# Patient Record
Sex: Female | Born: 1955 | Race: Black or African American | Hispanic: No | Marital: Married | Smoking: Former smoker
Health system: Southern US, Community
[De-identification: ages and names within clinical notes are randomized; demographics above are authoritative.]

## PROBLEM LIST (undated history)

## (undated) DIAGNOSIS — E876 Hypokalemia: Secondary | ICD-10-CM

## (undated) DIAGNOSIS — I219 Acute myocardial infarction, unspecified: Secondary | ICD-10-CM

## (undated) DIAGNOSIS — E785 Hyperlipidemia, unspecified: Secondary | ICD-10-CM

## (undated) DIAGNOSIS — R55 Syncope and collapse: Secondary | ICD-10-CM

## (undated) DIAGNOSIS — I6529 Occlusion and stenosis of unspecified carotid artery: Secondary | ICD-10-CM

## (undated) DIAGNOSIS — N281 Cyst of kidney, acquired: Secondary | ICD-10-CM

## (undated) DIAGNOSIS — I10 Essential (primary) hypertension: Secondary | ICD-10-CM

## (undated) DIAGNOSIS — C3491 Malignant neoplasm of unspecified part of right bronchus or lung: Secondary | ICD-10-CM

## (undated) DIAGNOSIS — C73 Malignant neoplasm of thyroid gland: Secondary | ICD-10-CM

## (undated) DIAGNOSIS — C801 Malignant (primary) neoplasm, unspecified: Secondary | ICD-10-CM

## (undated) DIAGNOSIS — K219 Gastro-esophageal reflux disease without esophagitis: Secondary | ICD-10-CM

## (undated) DIAGNOSIS — K5790 Diverticulosis of intestine, part unspecified, without perforation or abscess without bleeding: Secondary | ICD-10-CM

## (undated) DIAGNOSIS — C3490 Malignant neoplasm of unspecified part of unspecified bronchus or lung: Secondary | ICD-10-CM

## (undated) DIAGNOSIS — E119 Type 2 diabetes mellitus without complications: Secondary | ICD-10-CM

## (undated) DIAGNOSIS — Z9089 Acquired absence of other organs: Secondary | ICD-10-CM

## (undated) DIAGNOSIS — J449 Chronic obstructive pulmonary disease, unspecified: Secondary | ICD-10-CM

## (undated) DIAGNOSIS — I2510 Atherosclerotic heart disease of native coronary artery without angina pectoris: Secondary | ICD-10-CM

## (undated) DIAGNOSIS — E890 Postprocedural hypothyroidism: Secondary | ICD-10-CM

## (undated) DIAGNOSIS — I700 Atherosclerosis of aorta: Secondary | ICD-10-CM

## (undated) DIAGNOSIS — E042 Nontoxic multinodular goiter: Secondary | ICD-10-CM

## (undated) HISTORY — PX: TUBAL LIGATION: SHX77

## (undated) HISTORY — PX: TONSILLECTOMY: SUR1361

## (undated) HISTORY — PX: LUMBAR FUSION: SHX111

## (undated) HISTORY — DX: Hypokalemia: E87.6

## (undated) HISTORY — DX: Malignant (primary) neoplasm, unspecified: C80.1

## (undated) HISTORY — PX: COLONOSCOPY: SHX174

## (undated) HISTORY — PX: NECK SURGERY: SHX720

## (undated) HISTORY — DX: Atherosclerotic heart disease of native coronary artery without angina pectoris: I25.10

## (undated) HISTORY — DX: Syncope and collapse: R55

## (undated) HISTORY — DX: Acute myocardial infarction, unspecified: I21.9

## (undated) HISTORY — DX: Essential (primary) hypertension: I10

## (undated) HISTORY — PX: BACK SURGERY: SHX140

---

## 1998-09-03 DIAGNOSIS — I219 Acute myocardial infarction, unspecified: Secondary | ICD-10-CM

## 1998-09-03 HISTORY — DX: Acute myocardial infarction, unspecified: I21.9

## 1998-09-03 HISTORY — PX: CORONARY ANGIOPLASTY WITH STENT PLACEMENT: SHX49

## 2003-01-03 DIAGNOSIS — I639 Cerebral infarction, unspecified: Secondary | ICD-10-CM

## 2003-01-03 HISTORY — DX: Cerebral infarction, unspecified: I63.9

## 2003-07-18 HISTORY — PX: CAROTID STENT: SHX1301

## 2016-07-14 DIAGNOSIS — I2510 Atherosclerotic heart disease of native coronary artery without angina pectoris: Secondary | ICD-10-CM | POA: Insufficient documentation

## 2016-07-14 DIAGNOSIS — I10 Essential (primary) hypertension: Secondary | ICD-10-CM | POA: Insufficient documentation

## 2016-07-14 DIAGNOSIS — Z0000 Encounter for general adult medical examination without abnormal findings: Secondary | ICD-10-CM | POA: Insufficient documentation

## 2016-07-14 DIAGNOSIS — Z720 Tobacco use: Secondary | ICD-10-CM | POA: Insufficient documentation

## 2016-07-14 DIAGNOSIS — Z9889 Other specified postprocedural states: Secondary | ICD-10-CM | POA: Insufficient documentation

## 2018-09-04 DIAGNOSIS — E118 Type 2 diabetes mellitus with unspecified complications: Secondary | ICD-10-CM | POA: Insufficient documentation

## 2018-09-04 DIAGNOSIS — R7309 Other abnormal glucose: Secondary | ICD-10-CM | POA: Insufficient documentation

## 2018-09-23 DIAGNOSIS — Z955 Presence of coronary angioplasty implant and graft: Secondary | ICD-10-CM | POA: Insufficient documentation

## 2018-09-23 DIAGNOSIS — R55 Syncope and collapse: Secondary | ICD-10-CM | POA: Insufficient documentation

## 2019-09-11 ENCOUNTER — Other Ambulatory Visit: Payer: Self-pay | Admitting: Internal Medicine

## 2019-09-11 DIAGNOSIS — Z1231 Encounter for screening mammogram for malignant neoplasm of breast: Secondary | ICD-10-CM

## 2019-09-16 ENCOUNTER — Telehealth: Payer: Self-pay | Admitting: *Deleted

## 2019-09-16 ENCOUNTER — Encounter: Payer: Self-pay | Admitting: *Deleted

## 2019-09-16 DIAGNOSIS — Z122 Encounter for screening for malignant neoplasm of respiratory organs: Secondary | ICD-10-CM

## 2019-09-16 DIAGNOSIS — Z87891 Personal history of nicotine dependence: Secondary | ICD-10-CM

## 2019-09-16 NOTE — Telephone Encounter (Signed)
Received referral for initial lung cancer screening scan. Contacted patient and obtained smoking history,(current, 36.75 pack year) as well as answering questions related to screening process. Patient denies signs of lung cancer such as weight loss or hemoptysis. Patient denies comorbidity that would prevent curative treatment if lung cancer were found. Patient is scheduled for shared decision making visit and CT scan on 09/23/19 at 130pm.

## 2019-09-17 NOTE — Progress Notes (Signed)
ELECTROPHYSIOLOGY CONSULT NOTE  Patient ID: Ann [name], MRN: [id number], DOB/AGE: 1955/05/22 64 y.o. Admit date: (Not on file) Date of Consult: 09/18/2019  Primary Physician: Kirk Ruths, MD Primary Cardiologist: new     Ann [name] is a 64 y.o. female who is being seen today for the evaluation of syncope at the request of MA.    HPI Ann [name] is a 64 y.o. female referred because of syncope and hypertension with recurrent hypokalemia.  She has a history of coronary artery disease with prior stenting most recently 2005, details not available.  Most recent EF was 55-65% not withstanding her reporting of 3 prior MI.  Denies chest pain or shortness of breath but activity is largely limited because of severe back pain for which she has had multiple surgeries.  No history of tachypalpitations.  Over the most recent year and a half she has had about 10 episodes of syncope.  Characterized by abrupt onset without warning.  Recovery phase is notable for persistent orthostatic intolerance, diaphoresis but no flushing or nausea.  She is incontinent.  (See discussion below)   No orthostatic intolerance.  Significant back problems with chronic pain.  Associate with elevated blood pressure.  Uses alcohol regularly.  Admits to 2 shots per day.  Smokes.    DATE TEST EF   9/21 Echo (CE)  55-65%         Date Cr K AST Hgb  3/20 0.7 3.5 13    2/21 0.7 3.3  54   9/21  3.2 62       Past Medical History:  Diagnosis Date  . Coronary artery disease    Prior stenting  . Hypertension Hypoxic  . Hypokalemia   . Syncope       Surgical History: History reviewed. No pertinent surgical history.   Home Meds: Current Meds  Medication Sig  . aspirin 325 MG tablet Take 325 mg by mouth daily.   . fluticasone (FLONASE) 50 MCG/ACT nasal spray Place 2 sprays into both nostrils daily.  Marland Kitchen lisinopril-hydrochlorothiazide (ZESTORETIC) 20-12.5 MG tablet Take 1 tablet by mouth daily.   . metoprolol tartrate (LOPRESSOR) 25 MG tablet Take 25 mg by mouth 2 (two) times daily.  . rosuvastatin (CRESTOR) 20 MG tablet Take 20 mg by mouth daily.  Marland Kitchen spironolactone (ALDACTONE) 25 MG tablet Take 25 mg by mouth daily.    Allergies:  Allergies  Allergen Reactions  . Clopidogrel Hives    Throat closes    Social History   Socioeconomic History  . Marital status: Married    Spouse name: Not on file  . Number of children: Not on file  . Years of education: Not on file  . Highest education level: Not on file  Occupational History  . Not on file  Tobacco Use  . Smoking status: Current Every Day Smoker    Packs/day: 0.75    Years: 49.00    Pack years: 36.75    Types: Cigarettes  . Smokeless tobacco: Never Used  Substance and Sexual Activity  . Alcohol use: Yes  . Drug use: Not on file  . Sexual activity: Not on file  Other Topics Concern  . Not on file  Social History Narrative  . Not on file   Social Determinants of Health   Financial Resource Strain:   . Difficulty of Paying Living Expenses: Not on file  Food Insecurity:   . Worried About Charity fundraiser in the Last Year: Not  on file  . Ran Out of Food in the Last Year: Not on file  Transportation Needs:   . Lack of Transportation (Medical): Not on file  . Lack of Transportation (Non-Medical): Not on file  Physical Activity:   . Days of Exercise per Week: Not on file  . Minutes of Exercise per Session: Not on file  Stress:   . Feeling of Stress : Not on file  Social Connections:   . Frequency of Communication with Friends and Family: Not on file  . Frequency of Social Gatherings with Friends and Family: Not on file  . Attends Religious Services: Not on file  . Active Member of Clubs or Organizations: Not on file  . Attends Archivist Meetings: Not on file  . Marital Status: Not on file  Intimate Partner Violence:   . Fear of Current or Ex-Partner: Not on file  . Emotionally Abused: Not on  file  . Physically Abused: Not on file  . Sexually Abused: Not on file     No family history on file.   ROS:  Please see the history of present illness.     All other systems reviewed and negative.    Physical Exam:  Blood pressure (!) 173/98, pulse 71, height 5' 2.5" (1.588 m), weight 103 lb (46.7 kg), SpO2 98 %. General: Well developed, well nourished female in no acute distress. Head: Normocephalic, atraumatic, sclera non-icteric, no xanthomas, nares are without discharge. EENT: normal  Lymph Nodes:  none Neck: Negative for carotid bruits. JVD 8 cm Back:without scoliosis kyphosis  Lungs: Clear bilaterally to auscultation without wheezes, rales, or rhonchi. Breathing is unlabored. Heart: RRR with diminished S1 S2. NO murmur . No rubs, or gallops appreciated. Abdomen: Soft, non-tender, non-distended with normoactive bowel sounds. No hepatomegaly. No rebound/guarding. No obvious abdominal masses. Msk:  Strength and tone appear normal for age. Extremities: No clubbing or cyanosis. No edema.  Distal pedal pulses are 2+ and equal bilaterally. Skin: Warm and Dry Neuro: Alert and oriented X 3. CN III-XII intact Grossly normal sensory and motor function .  Gait stilted Psych:  Responds to questions appropriately with a normal affect.      Labs: Cardiac Enzymes No results for input(s): CKTOTAL, CKMB, TROPONINI in the last 72 hours. CBC No results found for: WBC, HGB, HCT, MCV, PLT PROTIME: No results for input(s): LABPROT, INR in the last 72 hours. Chemistry No results for input(s): NA, K, CL, CO2, BUN, CREATININE, CALCIUM, PROT, BILITOT, ALKPHOS, ALT, AST, GLUCOSE in the last 168 hours.  Invalid input(s): LABALBU Lipids No results found for: CHOL, HDL, LDLCALC, TRIG BNP No results found for: PROBNP Thyroid Function Tests: No results for input(s): TSH, T4TOTAL, T3FREE, THYROIDAB in the last 72 hours.  Invalid input(s): FREET3 Miscellaneous No results found for:  DDIMER  Radiology/Studies:  No results found.  EKG: sinus @ 71 O/w normal    Assessment and Plan:  Syncope  Hypertension  Hypokalemia  Back issues--surgeries and chronic pain  SVT-- ablation  CAD remote stenting     Recurrent syncope notwithstanding being without warning but protracted unconsciousness esp with normal ECG and Echo suggests a neurally mediated mechanism.  The hx of CAD and the absence of info regarding infarction or the mechanism of her ablated SVT raise spectre of other causes or possibly an arrhythmic trigger for a neurally mediated episode  Have recommended ILR insertion given the recurrences and the negative external monitor  She is agreeable   Her hypertension is  of long standing as is her recurrent hypokalemia which is likely aggravated by her HCTZ which I think is now stopped in favor of aldactone Need to rule out hyperaldosteronism>> will order renin/aldo levels  BP is high today but she says it is because of severe pain  She admits to "two shots" every day  discus       Virl Axe

## 2019-09-18 ENCOUNTER — Other Ambulatory Visit
Admission: RE | Admit: 2019-09-18 | Discharge: 2019-09-18 | Disposition: A | Discharge status: Home / Self Care | Payer: Medicare Other | Source: Ambulatory Visit | Attending: Internal Medicine | Admitting: Internal Medicine

## 2019-09-18 ENCOUNTER — Encounter: Payer: Self-pay | Admitting: Internal Medicine

## 2019-09-18 ENCOUNTER — Ambulatory Visit (INDEPENDENT_AMBULATORY_CARE_PROVIDER_SITE_OTHER): Payer: Medicare Other | Admitting: Internal Medicine

## 2019-09-18 ENCOUNTER — Other Ambulatory Visit: Payer: Self-pay

## 2019-09-18 VITALS — BP 173/98 | HR 71 | Ht 62.5 in | Wt 103.0 lb

## 2019-09-18 DIAGNOSIS — R55 Syncope and collapse: Secondary | ICD-10-CM | POA: Insufficient documentation

## 2019-09-18 LAB — COMPREHENSIVE METABOLIC PANEL
ALT: 21 U/L (ref 0–44)
AST: 27 U/L (ref 15–41)
Albumin: 3.6 g/dL (ref 3.5–5.0)
Alkaline Phosphatase: 63 U/L (ref 38–126)
Anion gap: 10 (ref 5–15)
BUN: 16 mg/dL (ref 8–23)
CO2: 27 mmol/L (ref 22–32)
Calcium: 8.8 mg/dL — ABNORMAL LOW (ref 8.9–10.3)
Chloride: 103 mmol/L (ref 98–111)
Creatinine, Ser: 0.59 mg/dL (ref 0.44–1.00)
GFR calc Af Amer: >60 mL/min (ref >60)
GFR calc non Af Amer: >60 mL/min (ref >60)
Glucose, Bld: 88 mg/dL (ref 70–99)
Potassium: 3.7 mmol/L (ref 3.5–5.1)
Sodium: 140 mmol/L (ref 135–145)
Total Bilirubin: 0.5 mg/dL (ref 0.3–1.2)
Total Protein: 6.7 g/dL (ref 6.5–8.1)

## 2019-09-18 LAB — MAGNESIUM: Magnesium: 1.7 mg/dL (ref 1.7–2.4)

## 2019-09-18 NOTE — Patient Instructions (Signed)
Medication Instructions:  - Your physician recommends that you continue on your current medications as directed. Please refer to the Current Medication list given to you today.  *If you need a refill on your cardiac medications before your next appointment, please call your pharmacy*   Lab Work: - Your physician recommends that you have lab work today: CMET/ Magnesium/ Renin-aldosterone level   Testing/Procedures: - Your physician has recommended that you have an [place] (LINQ device) placed.   1) Please shower the night before and morning of your procedure with an antibacterial soap (any brand). 2) You may eat a light breakfast/ lunch prior to coming in. 3) You may take all of your regular medications as normal the day of your procedure 4) You may drive yourself home from the procedure 5) If you have someone with you, they will be asked to step out of the room during the implant    Follow-Up: At [facility], you and your health needs are our priority.  As part of our continuing mission to provide you with exceptional heart care, we have created designated Provider Care Teams.  These Care Teams include your primary Cardiologist (physician) and Advanced Practice Providers (APPs -  Physician Assistants and Nurse Practitioners) who all work together to provide you with the care you need, when you need it.  We recommend signing up for the patient portal called "MyChart".  Sign up information is provided on this After Visit Summary.  MyChart is used to connect with patients for Virtual Visits (Telemedicine).  Patients are able to view lab/test results, encounter notes, upcoming appointments, etc.  Non-urgent messages can be sent to your provider as well.   To learn more about what you can do with MyChart, go to [email or web address].    Your next appointment:   Tuesday 09/30/19 @ 8:30 am (arrive at 8:15 am)  The format for your next appointment:   In  Person  Provider:   Virl Axe, MD   Other Instructions

## 2019-09-23 ENCOUNTER — Other Ambulatory Visit: Payer: Self-pay

## 2019-09-23 ENCOUNTER — Inpatient Hospital Stay: Payer: Medicare Other | Attending: Nurse Practitioner | Admitting: Oncology

## 2019-09-23 ENCOUNTER — Ambulatory Visit
Admission: RE | Admit: 2019-09-23 | Discharge: 2019-09-23 | Disposition: A | Discharge status: Home / Self Care | Payer: Medicare Other | Source: Ambulatory Visit | Attending: Nurse Practitioner | Admitting: Nurse Practitioner

## 2019-09-23 DIAGNOSIS — Z87891 Personal history of nicotine dependence: Secondary | ICD-10-CM | POA: Diagnosis not present

## 2019-09-23 DIAGNOSIS — Z122 Encounter for screening for malignant neoplasm of respiratory organs: Secondary | ICD-10-CM | POA: Insufficient documentation

## 2019-09-23 LAB — ALDOSTERONE + RENIN ACTIVITY W/ RATIO
ALDO / PRA Ratio: 42.6 — ABNORMAL HIGH (ref 0.0–30.0)
Aldosterone: 7.2 ng/dL (ref 0.0–30.0)
PRA LC/MS/MS: 0.169 ng/mL/hr (ref 0.167–5.380)

## 2019-09-23 NOTE — Progress Notes (Signed)
Virtual Visit via Video Note  I connected with Ann [name] on 09/23/19 at  1:30 PM EDT by a video enabled telemedicine application and verified that I am speaking with the correct person using two identifiers.  Location: Patient: [facility] Provider: Clinic    I discussed the limitations of evaluation and management by telemedicine and the availability of in person appointments. The patient expressed understanding and agreed to proceed.  I discussed the assessment and treatment plan with the patient. The patient was provided an opportunity to ask questions and all were answered. The patient agreed with the plan and demonstrated an understanding of the instructions.   The patient was advised to call back or seek an in-person evaluation if the symptoms worsen or if the condition fails to improve as anticipated.   In accordance with CMS guidelines, patient has met eligibility criteria including age, absence of signs or symptoms of lung cancer.  Social History   Tobacco Use  . Smoking status: Current Every Day Smoker    Packs/day: 0.75    Years: 49.00    Pack years: 36.75    Types: Cigarettes  . Smokeless tobacco: Never Used  Substance Use Topics  . Alcohol use: Yes  . Drug use: Not on file      A shared decision-making session was conducted prior to the performance of CT scan. This includes one or more decision aids, includes benefits and harms of screening, follow-up diagnostic testing, over-diagnosis, false positive rate, and total radiation exposure.   Counseling on the importance of adherence to annual lung cancer LDCT screening, impact of co-morbidities, and ability or willingness to undergo diagnosis and treatment is imperative for compliance of the program.   Counseling on the importance of continued smoking cessation for former smokers; the importance of smoking cessation for current smokers, and information about tobacco cessation interventions have been given to patient including  [place] and [address] quit [place] programs.   Written order for lung cancer screening with LDCT has been given to the patient and any and all questions have been answered to the best of my abilities.    Yearly follow up will be coordinated by Burgess Estelle, Thoracic Navigator.  I provided 15 minutes of face-to-face video visit time during this encounter, and > 50% was spent counseling as documented under my assessment & plan.   Ann Hawking, NP

## 2019-09-25 ENCOUNTER — Telehealth: Payer: Self-pay | Admitting: Internal Medicine

## 2019-09-25 ENCOUNTER — Other Ambulatory Visit: Payer: Self-pay | Admitting: *Deleted

## 2019-09-25 ENCOUNTER — Telehealth: Payer: Self-pay | Admitting: *Deleted

## 2019-09-25 DIAGNOSIS — R911 Solitary pulmonary nodule: Secondary | ICD-10-CM

## 2019-09-25 NOTE — Telephone Encounter (Signed)
I spoke with the patient. I have advised her of her lab results. She voices understanding. She is aware I will forward a copy of these to her PCP, Dr. Ouida Sills.  She advised she saw him yesterday, but made no mention of these results. She states Dr. Ouida Sills, did draw some other labs on her yesterday, but she was not sure what these were for.

## 2019-09-25 NOTE — Telephone Encounter (Signed)
After discussion with PCP nurse, notified patient of LDCT lung cancer screening program results with recommendation for PET scan follow up imaging. Also notified of incidental findings noted below and is encouraged to discuss further with PCP who will receive a copy of this note and/or the CT report. Patient verbalizes understanding.   Patient reports she has spoken with her PCP and is in agreement with this plan. I will obtain PET appointment and contact patient.  IMPRESSION: 1. Based on strict size criteria alone, there is a suspicious right upper lobe nodule technically characterized as Lung-RADS 4A, suspicious. However, given the morphology of the lesion, this is considered highly suspicious from an imaging standpoint and further evaluation with PET-CT is strongly recommended in the near future to exclude neoplasm. 2. The "S" modifier above refers to potentially clinically significant non lung cancer related findings. Specifically, there is aortic atherosclerosis, in addition to left main and 3 vessel coronary artery disease. Please note that although the presence of coronary artery calcium documents the presence of coronary artery disease, the severity of this disease and any potential stenosis cannot be assessed on this non-gated CT examination. Assessment for potential risk factor modification, dietary therapy or pharmacologic therapy may be warranted, if clinically indicated. 3. Mild diffuse bronchial wall thickening with mild centrilobular and paraseptal emphysema; imaging findings suggestive of underlying COPD.  These results will be called to the ordering clinician or representative by the Radiologist Assistant, and communication documented in the PACS or [employer].  Aortic Atherosclerosis (ICD10-I70.0) and Emphysema (ICD10-J43.9).

## 2019-09-25 NOTE — Telephone Encounter (Signed)
Ann Sprang, MD  09/24/2019 4:34 PM EDT     Nira Conn Can you let her know this is concerning for hyperaldo and have sent the labs to Dr Ouida Sills and asked him to followup   Thanks SK

## 2019-09-26 ENCOUNTER — Other Ambulatory Visit: Payer: Self-pay

## 2019-09-26 ENCOUNTER — Ambulatory Visit
Admission: RE | Admit: 2019-09-26 | Discharge: 2019-09-26 | Disposition: A | Discharge status: Home / Self Care | Payer: Medicare Other | Source: Ambulatory Visit | Attending: Internal Medicine | Admitting: Internal Medicine

## 2019-09-26 DIAGNOSIS — Z1231 Encounter for screening mammogram for malignant neoplasm of breast: Secondary | ICD-10-CM | POA: Diagnosis present

## 2019-09-26 NOTE — Telephone Encounter (Signed)
Obtained and notified patient of appt for PET scan 10/02/19 with 12noon arrival time and PET department to call the day prior for further instructions. Patient verbalized understanding.

## 2019-09-30 ENCOUNTER — Ambulatory Visit (INDEPENDENT_AMBULATORY_CARE_PROVIDER_SITE_OTHER): Payer: Medicare Other | Admitting: Internal Medicine

## 2019-09-30 ENCOUNTER — Encounter: Payer: Self-pay | Admitting: Internal Medicine

## 2019-09-30 ENCOUNTER — Other Ambulatory Visit: Payer: Self-pay

## 2019-09-30 VITALS — BP 190/100 | HR 76 | Ht 62.0 in | Wt 101.2 lb

## 2019-09-30 DIAGNOSIS — R55 Syncope and collapse: Secondary | ICD-10-CM

## 2019-09-30 DIAGNOSIS — Z95818 Presence of other cardiac implants and grafts: Secondary | ICD-10-CM

## 2019-09-30 HISTORY — DX: Presence of other cardiac implants and grafts: Z95.818

## 2019-09-30 HISTORY — PX: LOOP RECORDER INSERTION: EP1214

## 2019-09-30 NOTE — Patient Instructions (Signed)
Medication Instructions:  - Your physician recommends that you continue on your current medications as directed. Please refer to the Current Medication list given to you today.  *If you need a refill on your cardiac medications before your next appointment, please call your pharmacy*   Lab Work: - none ordered  If you have labs (blood work) drawn today and your tests are completely normal, you will receive your results only by: Marland Kitchen MyChart Message (if you have MyChart) OR . A paper copy in the mail If you have any lab test that is abnormal or we need to change your treatment, we will call you to review the results.   Testing/Procedures: - none ordered   Follow-Up: At [facility], you and your health needs are our priority.  As part of our continuing mission to provide you with exceptional heart care, we have created designated Provider Care Teams.  These Care Teams include your primary Cardiologist (physician) and Advanced Practice Providers (APPs -  Physician Assistants and Nurse Practitioners) who all work together to provide you with the care you need, when you need it.  We recommend signing up for the patient portal called "MyChart".  Sign up information is provided on this After Visit Summary.  MyChart is used to connect with patients for Virtual Visits (Telemedicine).  Patients are able to view lab/test results, encounter notes, upcoming appointments, etc.  Non-urgent messages can be sent to your provider as well.   To learn more about what you can do with MyChart, go to [email or web address].    Your next appointment:   As needed   The format for your next appointment:   In Person  Provider:   Virl Axe, MD   Other Instructions 1) You may shower tonight night 2) You may remove your tegaderm (top) dressing on Day 4 post procedure: Saturday (10/2) 3) You may remove your steri strips on Day 6 post procedure: Monday (10/4), if they have not fallen off on their own 4)  If you have any bleeding issues or concerns with you implant site after getting home, please call our [facility] directly at [phone].

## 2019-09-30 NOTE — Progress Notes (Signed)
Patient Care Team: Kirk Ruths, MD as PCP - General (Internal Medicine)   HPI  Ann [name] is a 64 y.o. female Seen in followup for syncope for loop recorder   She has a history of coronary artery disease with prior stenting most recently 2005, details not available.  Most recent EF was 55-65% not withstanding her reporting of 3 prior MI.  Intercurrently diagnosed with likely hyperaldosteronism-- have notified PCP  Now with mouth swelling and pain  The patient denies chest pain, shortness of breath, nocturnal dyspnea, orthopnea or peripheral edema.  There have been no palpitations, lightheadedness or syncope.      DATE TEST EF   9/21 Echo (CE)  55-65%         Date Cr K AST Hgb  3/20 0.7 3.5 13    2/21 0.7 3.3  54   9/21  3.2 62   09/24/19  4.1        Records and Results Reviewed   Past Medical History:  Diagnosis Date  . Coronary artery disease    Prior stenting  . Hypertension Hypoxic  . Hypokalemia   . Syncope     Past Surgical History:  Procedure Laterality Date  . BACK SURGERY    . NECK SURGERY      Current Meds  Medication Sig  . aspirin 325 MG tablet Take 325 mg by mouth daily.   . fluticasone (FLONASE) 50 MCG/ACT nasal spray Place 2 sprays into both nostrils daily.  . furosemide (LASIX) 20 MG tablet Take 20 mg by mouth as needed.  Marland Kitchen lisinopril-hydrochlorothiazide (ZESTORETIC) 20-12.5 MG tablet Take 1 tablet by mouth daily.  . metoprolol tartrate (LOPRESSOR) 25 MG tablet Take 25 mg by mouth 2 (two) times daily.  . rosuvastatin (CRESTOR) 20 MG tablet Take 20 mg by mouth daily.  Marland Kitchen spironolactone (ALDACTONE) 25 MG tablet Take 25 mg by mouth daily.    Allergies  Allergen Reactions  . Clopidogrel Hives    Throat closes      Review of Systems negative except from HPI and PMH  Physical Exam BP (!) 190/100 (BP Location: Left Arm, Patient Position: Sitting, Cuff Size: Normal)   Pulse 76   Ht 5\' 2"  (1.575 m)   Wt 101  lb 4 oz (45.9 kg)   SpO2 97%   BMI 18.52 kg/m  Well developed and well nourished in no acute distress HENT normal E scleral and icterus clear Neck Supple JVP flat; carotids brisk and full Clear to ausculation Regular rate and rhythm, no murmurs gallops or rub Soft with active bowel sounds No clubbing cyanosis Edema Alert and oriented, grossly normal motor and sensory function Skin Warm and Dry     Estimated Creatinine Clearance: 50.8 mL/min (by C-G formula based on SCr of 0.59 mg/dL).   Assessment and  Plan Syncope  Hypertension  Hypokalemia  Back issues--surgeries and chronic pain  SVT-- ablation  CAD remote stenting  Lung Mass -- spiculated   BP poorly controlled Have encouraged her to est with endocrine given hyperaldo and question of testosterone from years ago  No interval syncope  Lung mass under investigation  Likely tooth abscess>> UpTODATE >. Drainage and abx ( cetriaxone ) so will defer to dentist   Pre op Dx .syncope  Post op Dx Same  Procedure  Loop Recorder implantation  After routine prep and drape of the left parasternal area, a small incision was created. A Medtronic LINQ Reveal Loop Recorder  Serial  Number  [id number] was inserted.    SteriStrip dressing was  applied.  The patient tolerated the procedure without apparent complication.  EBL < 10cc       Current medicines are reviewed at length with the patient today .  The patient does not have concerns regarding medicines.

## 2019-10-02 ENCOUNTER — Other Ambulatory Visit: Payer: Self-pay

## 2019-10-02 ENCOUNTER — Encounter
Admission: RE | Admit: 2019-10-02 | Discharge: 2019-10-02 | Disposition: A | Discharge status: Home / Self Care | Payer: Medicare Other | Source: Ambulatory Visit | Attending: Nurse Practitioner | Admitting: Nurse Practitioner

## 2019-10-02 DIAGNOSIS — R911 Solitary pulmonary nodule: Secondary | ICD-10-CM | POA: Diagnosis present

## 2019-10-02 LAB — GLUCOSE, CAPILLARY: Glucose-Capillary: 94 mg/dL (ref 70–99)

## 2019-10-02 MED ORDER — FLUDEOXYGLUCOSE F - 18 (FDG) INJECTION
5.1000 | Freq: Once | INTRAVENOUS | Status: AC | PRN
  Administered 2019-10-02: 5.425 via INTRAVENOUS

## 2019-10-03 ENCOUNTER — Telehealth: Payer: Self-pay | Admitting: *Deleted

## 2019-10-03 ENCOUNTER — Encounter: Payer: Self-pay | Admitting: *Deleted

## 2019-10-03 NOTE — Telephone Encounter (Signed)
After having pulmonologist review PET scan, contacted patient with recommendation for referral to Dr. Nestor Lewandowsky and PFTs for evaluation of PET positive lung finding. Also, patient is aware of need for evaluation of rectal and thyroid findings. I will forwarding this note to PCP and calling their office to verbally review plan and recommendation. Patient is in agreement with this plan.

## 2019-10-03 NOTE — Progress Notes (Signed)
  Oncology Nurse Navigator Documentation  Navigator Location: [facility] (10/03/19 1400)   )Navigator Encounter Type: Introductory Phone Call (10/03/19 1400)   Abnormal Finding Date: 09/24/19 (10/03/19 1400)                   Treatment Phase: Abnormal Scans (10/03/19 1400) Barriers/Navigation Needs: Coordination of Care (10/03/19 1400)   Interventions: Coordination of Care;Referrals (10/03/19 1400) Referrals: Other (thoracic surgery) (10/03/19 1400) Coordination of Care: Appts (10/03/19 1400)         phone call made to patient to introduce to navigator services and review upcoming appts. Pt informed that appt with Dr. Lanney Gins at [facility] pulmonary has been moved to 10/09/19 at 8:30am and that our office will try to coordinate her to have PFT's that day as well. Informed that is scheduled with Dr. Genevive Bi on 10/10/19 at 8:30am as well. Contact info given and instructed to call with any further questions or needs. Pt verbalized understanding. Nothing further needed at this time.         Time Spent with Patient: 30 (10/03/19 1400)

## 2019-10-09 ENCOUNTER — Institutional Professional Consult (permissible substitution): Payer: Self-pay | Admitting: Internal Medicine

## 2019-10-10 ENCOUNTER — Ambulatory Visit (INDEPENDENT_AMBULATORY_CARE_PROVIDER_SITE_OTHER): Payer: Medicare Other | Admitting: Cardiothoracic Surgery

## 2019-10-10 ENCOUNTER — Telehealth: Payer: Self-pay

## 2019-10-10 ENCOUNTER — Other Ambulatory Visit: Payer: Self-pay

## 2019-10-10 ENCOUNTER — Encounter: Payer: Self-pay | Admitting: Cardiothoracic Surgery

## 2019-10-10 ENCOUNTER — Telehealth: Payer: Self-pay | Admitting: *Deleted

## 2019-10-10 VITALS — BP 168/112 | HR 77 | Temp 98.2°F | Ht 63.0 in | Wt 102.8 lb

## 2019-10-10 DIAGNOSIS — R911 Solitary pulmonary nodule: Secondary | ICD-10-CM | POA: Diagnosis not present

## 2019-10-10 NOTE — Patient Instructions (Addendum)
Dr.Oaks discussed the surgical treatment option with patient at today's visit.  Dr.Oaks discussed with patient that he would like to see results of PFT testing is done. Dr.Oaks suggested patient to try and STOP smoking two weeks prior to surgery.   Pulmonary Nodule A pulmonary nodule is tissue that has grown on your lung. A nodule may be cancer, but most nodules are not cancer. Follow these instructions at home:   Take over-the-counter and prescription medicines only as told by your doctor.  Do not use any products that have nicotine or tobacco, such as cigarettes and e-cigarettes. If you need help quitting, ask your doctor.  Keep all follow-up visits as told by your doctor. This is important. Contact a doctor if:  You have trouble breathing when doing activities.  You feel sick.  You feel more tired than normal.  You do not feel like eating.  You lose weight without trying.  You have chills.  You have night sweats. Get help right away if:  You cannot catch your breath.  You start making whistling sounds when breathing (wheezing).  You cannot stop coughing.  You cough up blood.  You get dizzy.  You feel like you are going to pass out (faint).  You have sudden chest pain.  You have a fever or symptoms for more than 2-3 days.  You have a fever and your symptoms suddenly get worse. Summary  A pulmonary nodule is tissue that has grown on your lung.  Most nodules are not cancer.  Your doctor will do tests to know what kind of nodule you have, and whether you need treatment for it. This information is not intended to replace advice given to you by your health care provider. Make sure you discuss any questions you have with your health care provider. Document Revised: 01/12/2017 Document Reviewed: 01/18/2016 Elsevier Patient Education  [place].

## 2019-10-10 NOTE — Telephone Encounter (Signed)
Cardiac Clearance received from [place]. Patient would be acceptable risk for planned procedure without further cardiovascular testing. Patient will hold on Aspirin for 5-7 days prior.   The is a separate encounter from [place] office in patient's chart.

## 2019-10-10 NOTE — Telephone Encounter (Signed)
Medical Clearance sent to Dr.Steven Caryl Comes office at today's visit.  Cardiac Clearance sent to [place] office at today's visit.

## 2019-10-10 NOTE — Telephone Encounter (Signed)
   Primary Cardiologist:Dr. Caryl Comes  Chart reviewed as part of pre-operative protocol coverage. The patient was doing well on cardiac stand point when last seen by Dr. Caryl Comes 09/30/19. Given past medical history and time since last visit, based on ACC/AHA guidelines, Hopie Pellegrin would be at acceptable risk for the planned procedure without further cardiovascular testing. She can hold ASA for 5-7 days prior.  I will route this recommendation to the requesting party via Epic fax function and remove from pre-op pool.  Please call with questions.  [place], [place] 10/10/2019, 11:20 AM

## 2019-10-10 NOTE — Progress Notes (Signed)
Patient ID: Ann [name], female   DOB: [birth year], 64 y.o.   MRN: [id number]  No chief complaint on file.   Referred By Dr. Frazier Richards Reason for Referral right upper lobe mass  HPI Location, Quality, Duration, Severity, Timing, Context, Modifying Factors, Associated Signs and Symptoms.  Ann [name] is a 64 y.o. female.  She has a longstanding history of coronary artery disease peripheral vascular disease and smoking.  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  She recently underwent a cardiac loop monitor and is being evaluated by our electrophysiologist.  She underwent CT scanning as part of the lung screening program.  This revealed a right upper lobe mass as well as other smaller pulmonary nodules and a enlarged right thyroid lobe.  The right thyroid lobe and the right upper lobe mass were hypermetabolic consistent with the CT scan findings and also consistent with bronchogenic carcinoma.  There was no evidence of metastatic disease.  The patient is a lifelong smoker.  She smokes 1 pack cigarettes every 3 days.  She has been smoking since she was a young adult.  She states that she is trying to quit.  She declined intervention for smoking cessation at this time.  She has no limitations.  She is able to walk up and down steps.  She has no limitations regarding her lungs.  She is scheduled to see an endocrinologist for her thyroid as well as a pulmonologist and pulmonary function test.   Past Medical History:  Diagnosis Date  . Coronary artery disease    Prior stenting  . Hypertension Hypoxic  . Hypokalemia   . Syncope     Past Surgical History:  Procedure Laterality Date  . BACK SURGERY    . NECK SURGERY      Family History  Problem Relation Age of Onset  . Alzheimer's disease Mother   . Heart attack Father   . Diabetes Mellitus II Brother   . Diabetes Mellitus II Brother     Social  History Social History   Tobacco Use  . Smoking status: Current Every Day Smoker    Packs/day: 0.25    Years: 49.00    Pack years: 12.25    Types: Cigarettes  . Smokeless tobacco: Never Used  . Tobacco comment: 1 pack lasts 3-4 days  Vaping Use  . Vaping Use: Never used  Substance Use Topics  . Alcohol use: Yes    Comment: 1 shot of vodka with lemonade every evening  . Drug use: Never    Allergies  Allergen Reactions  . Clopidogrel Hives    Throat closes    Current Outpatient Medications  Medication Sig Dispense Refill  . amoxicillin (AMOXIL) 500 MG capsule Take by mouth.    Marland Kitchen aspirin 325 MG tablet Take 325 mg by mouth daily.     . fluticasone (FLONASE) 50 MCG/ACT nasal spray Place 2 sprays into both nostrils daily.    . furosemide (LASIX) 20 MG tablet Take 20 mg by mouth as needed.    Marland Kitchen lisinopril-hydrochlorothiazide (ZESTORETIC) 20-12.5 MG tablet Take 1 tablet by mouth daily.    . metoprolol tartrate (LOPRESSOR) 25 MG tablet Take 25 mg by mouth 2 (two) times daily.    Marland Kitchen spironolactone (ALDACTONE) 25 MG tablet Take 25 mg by mouth daily.    . rosuvastatin (CRESTOR) 20 MG tablet Take 20 mg by mouth daily. (Patient not taking: Reported on 10/10/2019)  No current facility-administered medications for this visit.      Review of Systems A complete review of systems was asked and was negative except for the following positive findings recent syncopal episodes  Blood pressure (!) 168/112, pulse 77, temperature 98.2 F (36.8 C), temperature source Oral, height 5\' 3"  (1.6 m), weight 102 lb 12.8 oz (46.6 kg), SpO2 98 %.  Physical Exam CONSTITUTIONAL:  Pleasant, well-developed, well-nourished, and in no acute distress. EYES: Pupils equal and reactive to light, Sclera non-icteric EARS, NOSE, MOUTH AND THROAT:  The oropharynx was clear.  Dentition is good repair.  Oral mucosa pink and moist. LYMPH NODES:  Lymph nodes in the neck and axillae were normal RESPIRATORY:  Lungs  were clear.  Normal respiratory effort without pathologic use of accessory muscles of respiration CARDIOVASCULAR: Heart was regular without murmurs.  There were no carotid bruits. GI: The abdomen was soft, nontender, and nondistended. There were no palpable masses. There was no hepatosplenomegaly. There were normal bowel sounds in all quadrants. GU:  Rectal deferred.   MUSCULOSKELETAL:  Normal muscle strength and tone.  No clubbing or cyanosis.   SKIN:  There were no pathologic skin lesions.  There were no nodules on palpation. NEUROLOGIC:  Sensation is normal.  Cranial nerves are grossly intact. PSYCH:  Oriented to person, place and time.  Mood and affect are normal.  Data Reviewed CT scan and PET scan  I have personally reviewed the patient's imaging, laboratory findings and medical records.    Assessment    The CT scan and PET scan were independently reviewed.  There is a groundglass nodule which I believe is in the right upper lobe as well.  There is a second nodule which is much more concerning and is most compatible with a bronchogenic carcinoma.  I see no mediastinal adenopathy.  In addition the PET scan suggests a early thyroid carcinoma.    Plan    I reviewed with her the indications and risks of right thoracotomy and right upper lobectomy.  I explained to her that surgery offers the best chance of long-term cure.  Radiation therapy as an alternative.  She is scheduled to undergo pulmonary function testing as well as follow-up with pulmonary medicine and endocrinology.  We will ask her primary cardiologist to provide clearance for any potential upcoming surgery.  I reviewed with her in detail the risks of surgery including risks of bleeding infection air leak and death.  I also reviewed with her the data suggesting smoking cessation can reduce these risks.  She understands and would like to proceed with the work-up as outlined above.       Nestor Lewandowsky, MD 10/10/2019, 9:26  AM

## 2019-10-10 NOTE — Telephone Encounter (Signed)
° °  [facility] Pre-operative Risk Assessment    HEARTCARE STAFF: - Please ensure there is not already an duplicate clearance open for this procedure. - Under Visit Info/Reason for Call, type in Other and utilize the format Clearance MM/DD/YY or Clearance TBD. Do not use dashes or single digits. - If request is for dental extraction, please clarify the # of teeth to be extracted.  Request for surgical clearance:  1. What type of surgery is being performed? RIGHT THORACECTOMY and RIGHT LUNG RESECTION    2. When is this surgery scheduled? TBD   3. What type of clearance is required (medical clearance vs. Pharmacy clearance to hold med vs. Both)? MEDICAL  4. Are there any medications that need to be held prior to surgery and how long? ASA 325 MG   5. Practice name and name of physician performing surgery? [facility]; DR. Christia [facility]   6. What is the office phone number? [phone]   7.   What is the office fax number? [phone]  8.   Anesthesia type (None, local, MAC, general) ? GENERAL   Julaine Hua 10/10/2019, 10:44 AM  _________________________________________________________________   (provider comments below)

## 2019-10-13 ENCOUNTER — Telehealth: Payer: Self-pay

## 2019-10-13 ENCOUNTER — Telehealth: Payer: Self-pay | Admitting: Cardiothoracic Surgery

## 2019-10-13 NOTE — Telephone Encounter (Signed)
In calling patient this morning regarding scheduling of her surgery with  Dr. Genevive Bi.  She immediately stated "NO, I am not doing surgery, I will go to my primary care doctor and let them do whatever additional testing and send me to oncology".  She was very adamant on not scheduling surgery.  She was abrupt, not going to have it, said thank you and hung up.

## 2019-10-13 NOTE — Telephone Encounter (Signed)
Spoke with patient at this time- I offered to place the referral  to Radiation oncology and she stated she wanted to speak with her PCP and go for a second opinion and then make a decision. Patient stated she would let us know if our help was needed. She stated she did not want to have surgery.

## 2019-10-21 ENCOUNTER — Encounter: Payer: Self-pay | Admitting: General Surgery

## 2019-10-21 ENCOUNTER — Other Ambulatory Visit: Payer: Self-pay

## 2019-10-21 ENCOUNTER — Telehealth: Payer: Self-pay | Admitting: Cardiothoracic Surgery

## 2019-10-21 ENCOUNTER — Ambulatory Visit (INDEPENDENT_AMBULATORY_CARE_PROVIDER_SITE_OTHER): Payer: Medicare Other | Admitting: General Surgery

## 2019-10-21 ENCOUNTER — Ambulatory Visit: Payer: Self-pay

## 2019-10-21 VITALS — BP 206/109 | HR 65 | Temp 98.9°F | Ht 63.0 in | Wt 107.2 lb

## 2019-10-21 DIAGNOSIS — R911 Solitary pulmonary nodule: Secondary | ICD-10-CM | POA: Diagnosis not present

## 2019-10-21 DIAGNOSIS — E041 Nontoxic single thyroid nodule: Secondary | ICD-10-CM

## 2019-10-21 NOTE — Patient Instructions (Addendum)
Dr.Cannon performed an Thyroid Ultrasound at today's visit with patient. Patient will follow up with Dr.Oaks on Monday for a pre-operative visit.  Patient will follow up within the first week of December to follow up for Thyroid Biopsy.  Thyroid Nodule  A thyroid nodule is an isolated growth of thyroid cells that forms a lump in your thyroid gland. The thyroid gland is a butterfly-shaped gland. It is found in the lower front of your neck. This gland sends chemical messengers (hormones) through your blood to all parts of your body. These hormones are important in regulating your body temperature and helping your body to use energy. Thyroid nodules are common. Most are not cancerous (benign). You may have one nodule or several nodules. Different types of thyroid nodules include nodules that:  Grow and fill with fluid (thyroid cysts).  Produce too much thyroid hormone (hot nodules or hyperthyroid).  Produce no thyroid hormone (cold nodules or hypothyroid).  Form from cancer cells (thyroid cancers). What are the causes? In most cases, the cause of this condition is not known. What increases the risk? The following factors may make you more likely to develop this condition.  Age. Thyroid nodules become more common in people who are older than 65 years of age.  Gender. ? Benign thyroid nodules are more common in women. ? Cancerous (malignant) thyroid nodules are more common in men.  A family history that includes: ? Thyroid nodules. ? Pheochromocytoma. ? Thyroid carcinoma. ? Hyperparathyroidism.  Certain kinds of thyroid diseases, such as Hashimoto's thyroiditis.  Lack of iodine in your diet.  A history of head and neck radiation, such as from previous cancer treatment. What are the signs or symptoms? In many cases, there are no symptoms. If you have symptoms, they may include:  A lump in your lower neck.  Feeling a lump or tickle in your throat.  Pain in your neck, jaw, or  ear.  Having trouble swallowing. Hot nodules may cause symptoms that include:  Weight loss.  Warm, flushed skin.  Feeling hot.  Feeling nervous.  A racing heartbeat. Cold nodules may cause symptoms that include:  Weight gain.  Dry skin.  Brittle hair. This may also occur with hair loss.  Feeling cold.  Fatigue. Thyroid cancer nodules may cause symptoms that include:  Hard nodules that feel stuck to the thyroid gland.  Hoarseness.  Lumps in the glands near your thyroid (lymph nodes). How is this diagnosed? A thyroid nodule may be felt by your health care provider during a physical exam. This condition may also be diagnosed based on your symptoms. You may also have tests, including:  An ultrasound. This may be done to confirm the diagnosis.  A biopsy. This involves taking a sample from the nodule and looking at it under a microscope.  Blood tests to make sure that your thyroid is working properly.  A thyroid scan. This test uses a radioactive tracer injected into a vein to create an image of the thyroid gland on a computer screen.  Imaging tests such as MRI or CT scan. These may be done if: ? Your nodule is large. ? Your nodule is blocking your airway. ? Cancer is suspected. How is this treated? Treatment depends on the cause and size of your nodule or nodules. If the nodule is benign, treatment may not be necessary. Your health care provider may monitor the nodule to see if it goes away without treatment. If the nodule continues to grow, is cancerous, or does not  go away, treatment may be needed. Treatment may include:  Having a cystic nodule drained with a needle.  Ablation therapy. In this treatment, alcohol is injected into the area of the nodule to destroy the cells. Ablation with heat (thermal ablation) may also be used.  Radioactive iodine. In this treatment, radioactive iodine is given as a pill or liquid that you drink. This substance causes the thyroid  nodule to shrink.  Surgery to remove the nodule. Part or all of your thyroid gland may need to be removed as well.  Medicines. Follow these instructions at home:  Pay attention to any changes in your nodule.  Take over-the-counter and prescription medicines only as told by your health care provider.  Keep all follow-up visits as told by your health care provider. This is important. Contact a health care provider if:  Your voice changes.  You have trouble swallowing.  You have pain in your neck, ear, or jaw that is getting worse.  Your nodule gets bigger.  Your nodule starts to make it harder for you to breathe.  Your muscles look like they are shrinking (muscle wasting). Get help right away if:  You have chest pain.  There is a loss of consciousness.  You have a sudden fever.  You feel confused.  You are seeing or hearing things that other people do not see or hear (having hallucinations).  You feel very weak.  You have mood swings.  You feel very restless.  You feel suddenly nauseous or throw up.  You suddenly have diarrhea. Summary  A thyroid nodule is an isolated growth of thyroid cells that forms a lump in your thyroid gland.  Thyroid nodules are common. Most are not cancerous (benign). You may have one nodule or several nodules.  Treatment depends on the cause and size of your nodule or nodules. If the nodule is benign, treatment may not be necessary.  Your health care provider may monitor the nodule to see if it goes away without treatment. If the nodule continues to grow, is cancerous, or does not go away, treatment may be needed. This information is not intended to replace advice given to you by your health care provider. Make sure you discuss any questions you have with your health care provider. Document Revised: 08/03/2017 Document Reviewed: 08/06/2017 Elsevier Patient Education  [place].

## 2019-10-21 NOTE — Telephone Encounter (Signed)
Patient has been advised of Pre-Admission date/time, COVID Testing date and Surgery date while in the office today.    Surgery Date: 11/04/19 Preadmission Testing Date: 10/28/19 (phone 8a-1p) Covid Testing Date: 10/31/19 - patient advised to go to the [facility] between 8a-1p   Patient has been made aware to call [phone], between 1-3:00pm the day before surgery, to find out what time to arrive for surgery.

## 2019-10-21 NOTE — Telephone Encounter (Signed)
Ann [name] at [facility] was able to speak with the patient and her husband regarding dates for her surgery and they voice understanding.

## 2019-10-21 NOTE — Progress Notes (Signed)
Patient ID: Ann [name], female   DOB: 1955-03-23, 64 y.o.   MRN: [id number]  Chief Complaint  Patient presents with  . New Patient (Initial Visit)    New for Dr Celine Ahr- Thyroid Nodule ref Dr Genevive Bi    HPI [name] is a 64 y.o. female.   She has been referred by Dr. Genevive Bi for further evaluation and management of a thyroid nodule that was PET avid on a recent scan performed for a lung mass.  She reports that had the nodule not been discovered on other imaging, she would not be aware of its presence.  She denies any heart palpitations or hand tremors.  She does feel somewhat jittery and anxious, primarily due to her recent lung diagnosis.  She denies any dysphagia or frequent throat clearing.  No pressure sensation in her neck while supine.  No hoarseness or other voice changes.  No heat or cold intolerance.  She denies any changes in the texture of her hair, skin, or fingernails.  No concerns with diarrhea or constipation.  No increased fatigue.  She denies any unexplained weight loss or weight gain.  No ocular symptoms.  She says that 3 of her maternal aunts have undergone thyroid surgery, but she is not certain the reasons behind the operations.  She does not take any thyroid medication at this time.  She denies any occupational or therapeutic exposure to head or neck irradiation.   Past Medical History:  Diagnosis Date  . Coronary artery disease    Prior stenting  . Hypertension Hypoxic  . Hypokalemia   . Syncope     Patient Active Problem List   Diagnosis Date Noted  . Vasovagal syncope 09/23/2018  . H/O right coronary artery stent placement 09/23/2018  . Elevated hemoglobin A1c 09/04/2018  . Tobacco use 07/14/2016  . Status post lumbar surgery 07/14/2016  . HTN, goal below 140/90 07/14/2016  . Healthcare maintenance 07/14/2016  . Coronary artery disease involving native coronary artery of native heart without angina pectoris 07/14/2016   Also appears to have recently been  diagnosed with hyperaldosteronism.  Past Surgical History:  Procedure Laterality Date  . BACK SURGERY    . NECK SURGERY      Family History  Problem Relation Age of Onset  . Alzheimer's disease Mother   . Heart attack Father   . Diabetes Mellitus II Brother   . Diabetes Mellitus II Brother     Social History Social History   Tobacco Use  . Smoking status: Current Every Day Smoker    Packs/day: 0.25    Years: 49.00    Pack years: 12.25    Types: Cigarettes  . Smokeless tobacco: Never Used  . Tobacco comment: 1 pack lasts 3-4 days  Vaping Use  . Vaping Use: Never used  Substance Use Topics  . Alcohol use: Yes    Comment: 1 shot of vodka with lemonade every evening  . Drug use: Never    Allergies  Allergen Reactions  . Clopidogrel Anaphylaxis and Hives    Current Outpatient Medications  Medication Sig Dispense Refill  . amoxicillin (AMOXIL) 500 MG capsule Take by mouth. (Patient not taking: Reported on 10/21/2019)    . aspirin 325 MG tablet Take 325 mg by mouth daily.     . fluticasone (FLONASE) 50 MCG/ACT nasal spray Place 2 sprays into both nostrils daily as needed for allergies.     . furosemide (LASIX) 20 MG tablet Take 20 mg by mouth daily as needed for  edema.     . lisinopril-hydrochlorothiazide (ZESTORETIC) 20-12.5 MG tablet Take 1 tablet by mouth daily. (Patient not taking: Reported on 10/21/2019)    . metoprolol tartrate (LOPRESSOR) 25 MG tablet Take 25 mg by mouth 2 (two) times daily.    . rosuvastatin (CRESTOR) 20 MG tablet Take 20 mg by mouth daily.     . TRELEGY ELLIPTA 100-62.5-25 MCG/INH AEPB Inhale 1 puff into the lungs daily.    Marland Kitchen spironolactone (ALDACTONE) 25 MG tablet Take 25 mg by mouth daily.      No current facility-administered medications for this visit.    Review of Systems Review of Systems  All other systems reviewed and are negative. Or as discussed in the history of present illness  Blood pressure (!) 206/109, pulse 65, temperature  98.9 F (37.2 C), temperature source Oral, height 5\' 3"  (1.6 m), weight 107 lb 3.2 oz (48.6 kg), SpO2 97 %. Body mass index is 18.99 kg/m.  Physical Exam Physical Exam Constitutional:      General: She is not in acute distress.    Appearance: Normal appearance.     Comments: She is an extremely thin Black woman.  HENT:     Head: Normocephalic and atraumatic.     Nose:     Comments: Covered with a mask    Mouth/Throat:     Comments: Covered with a mask Eyes:     General: No scleral icterus.       Right eye: No discharge.        Left eye: No discharge.     Comments: No proptosis or exophthalmos.  No lid lag or stare.  Neck:     Comments: There is a scar from her prior ACDF.  Within the right lobe of the thyroid, there is a roughly 3 cm nodule.  Its contours are smooth and the consistency is somewhat rubbery.  The gland moves freely with deglutition.  The trachea is midline.  There is no palpable cervical or supraclavicular lymphadenopathy. Cardiovascular:     Rate and Rhythm: Normal rate and regular rhythm.     Pulses: Normal pulses.  Pulmonary:     Effort: Pulmonary effort is normal.     Breath sounds: Normal breath sounds.  Abdominal:     General: Bowel sounds are normal.     Palpations: Abdomen is soft.  Genitourinary:    Comments: Deferred Musculoskeletal:        General: No swelling or tenderness.  Skin:    General: Skin is warm and dry.  Neurological:     General: No focal deficit present.     Mental Status: She is alert and oriented to person, place, and time.  Psychiatric:        Mood and Affect: Mood normal.        Behavior: Behavior normal.     Data Reviewed The last TSH that I have available for review is from August 31, 2017.  It was normal at 0.785.  I reviewed the imaging from the PET scan performed on October 02, 2019.  I concur with the radiology impression which is copied here: CLINICAL DATA:  Initial treatment strategy for pulmonary  nodule.  EXAM: NUCLEAR MEDICINE PET SKULL BASE TO THIGH  TECHNIQUE: 5.5 mCi F-18 FDG was injected intravenously. Full-ring PET imaging was performed from the skull base to thigh after the radiotracer. CT data was obtained and used for attenuation correction and anatomic localization.  Fasting blood glucose: 94 mg/dl  COMPARISON:  CT  09/23/2019  FINDINGS: Mediastinal blood pool activity: SUV max 2.0  Liver activity: SUV max NA  NECK: No hypermetabolic lymph nodes in the neck. No cervical lymph nodes.  Asymmetric enlargement of the RIGHT lobe of the thyroid gland to 3.4 x 2.4 cm. The enlarged RIGHT gland has hypermetabolic activity with SUV max equal 4.8.  Incidental CT findings: none  CHEST: Within the RIGHT upper lobe, the spiculated nodule of concern measures 1.2 cm and has associated hypermetabolic activity with SUV max equal 2.6.  No additional suspicious nodules.  Ground-glass nodule in the RIGHT middle lobe measuring 7 mm (image 98/3) does not have associated metabolic activity.  No hypermetabolic or enlarged mediastinal lymph nodes.  Incidental CT findings: Coronary artery calcification and aortic atherosclerotic calcification.  Ovoid lesion in the LEFT breast measuring 1.4 cm has no associated metabolic activity.  ABDOMEN/PELVIS: No abnormal hypermetabolic activity within the liver, pancreas, adrenal glands, or spleen. No hypermetabolic lymph nodes in the abdomen or pelvis.  Intense activity at the level of the distal rectum/anus with SUV max equal 7.0. Minimal activity elsewhere in the bowel.  Incidental CT findings: Benign-appearing cyst of the RIGHT ovary  SKELETON: No focal hypermetabolic activity to suggest skeletal metastasis.  Incidental CT findings: Posterior lumbar fusion.  IMPRESSION: 1. 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 FDG PET staging T1a N0 M0. 2.  Ground-glass nodule RIGHT middle lobe. Recommend attention on follow-up. 3. Incidental finding of enlarged RIGHT lobe of thyroid gland with hypermetabolic activity. Recommend thyroid ultrasound to evaluate for thyroid carcinoma versus adenoma. 4. Incidental finding of intense metabolic activity at the level of the distal rectum/anus. This is favored physiologic and may relate to stool however there is minimal activity elsewhere in the bowel. Recommend physical exam/digital rectal exam.  I reviewed Dr. Genevive Bi' note from October 10, 2019 wherein he recommended surgical management of her pulmonary mass.  Further review of the electronic medical record, specifically telephone encounters suggest that the patient is resistant to surgical intervention for her lung mass, but during our clinic interview, it sounded like she was ready and interested to proceed.  I also performed an ultrasound in clinic today using the GE LOGIQ ultrasound and a 12 MHz linear array transducer.  Within the right lobe of the thyroid there is a 2.09 x 2.99 x 3.29 cm nodule.  It is heterogeneous with both cystic and solid components.  There are flecks of inspissated crystalloid throughout the nodule.  There is no increased intranodular blood flow.  Within the isthmus, there is a small hypoechoic nodule measuring 0.51 x 0.88 x 0.7 cm.  In the left lobe, there is a 0.88 x 0.91 x 1.45 cm nodule.  It does abut the capsule and has increased peripheral blood flow.  No concerning lymphadenopathy was appreciated.  Assessment This is a 64 year old woman with a lung mass.  A PET scan performed for further evaluation also demonstrated a PET avid thyroid nodule.  As there is a 30 to 50% risk of malignancy in PET avid thyroid nodules, fine-needle aspiration biopsy is indicated.  Today, however, she reports feeling very overwhelmed by the number of medical interventions she has recently been dealing with, as well as her upcoming surgery with Dr.  Genevive Bi.  She would like to defer biopsy until after she is recovered from her lung procedure.  I think this is reasonable, as thyroid cancer (if the nodule ends up being malignant) is generally a very indolent disease.  Plan We will schedule Ms. Ogborn for follow-up sometime in early December.  We will plan to perform a fine-needle aspiration biopsy of the thyroid nodule at that time.  We will also obtain thyroid function studies, as these do not appear to have been performed recently.    [name] 10/21/2019, 2:24 PM

## 2019-10-23 NOTE — Progress Notes (Signed)
Medical clearance received from Dr Ouida Sills. The patient is cleared at Medium to High risk for surgery.

## 2019-10-27 ENCOUNTER — Ambulatory Visit (INDEPENDENT_AMBULATORY_CARE_PROVIDER_SITE_OTHER): Payer: Medicare Other | Admitting: Cardiothoracic Surgery

## 2019-10-27 ENCOUNTER — Other Ambulatory Visit: Payer: Self-pay

## 2019-10-27 ENCOUNTER — Encounter: Payer: Self-pay | Admitting: Cardiothoracic Surgery

## 2019-10-27 VITALS — BP 170/92 | HR 71 | Ht 63.0 in | Wt 108.0 lb

## 2019-10-27 DIAGNOSIS — R911 Solitary pulmonary nodule: Secondary | ICD-10-CM

## 2019-10-27 NOTE — Progress Notes (Signed)
[name] Follow Up Note  Patient ID: Ann [name], female   DOB: [birth year], 64 y.o.   MRN: [id number]  HISTORY: I had the opportunity to see Mrs. Being back in the office today.  She was seen last week and had some questions about potential right upper lobectomy.  She is accompanied today by her husband.  She was congratulated that she has now quit smoking for the last 8 days.  She has no other questions or concerns today.    Vitals:   10/27/19 1000  BP: (!) 170/92  Pulse: 71     EXAM:  Resp: Lungs are clear bilaterally.  No respiratory distress, normal effort. Heart:  Regular without murmurs Abd:  Abdomen is soft, non distended and non tender. No masses are palpable.  There is no rebound and no guarding.  Neurological: Alert and oriented to person, place, and time. Coordination normal.  Skin: Skin is warm and dry. No rash noted. No diaphoretic. No erythema. No pallor.  Psychiatric: Normal mood and affect. Normal behavior. Judgment and thought content normal.    I independently reviewed her chest CT and PET scan with her and her husband again.  ASSESSMENT: There are 2 lung lesions in the right upper lobe.  There is a dominant lesion in the lateral aspect as well as a second groundglass opacity just above the fissure to the middle lobe.   PLAN:   I discussed with her the indications and risks of right thoracotomy and right upper lobectomy.  Risks of bleeding, infection, air leak and death were all reviewed.  A 5% mortality was quoted.  We also discussed the role of radiation therapy.  After extensive discussion with her and her husband they have agreed to proceed with right thoracotomy and right lung resection.    Nestor Lewandowsky, MD

## 2019-10-27 NOTE — H&P (View-Only) (Signed)
[facility] Follow Up Note  Patient ID: Ann [name], female   DOB: 1955-01-19, 64 y.o.   MRN: [id number]  HISTORY: I had the opportunity to see Mrs. Being back in the office today.  She was seen last week and had some questions about potential right upper lobectomy.  She is accompanied today by her husband.  She was congratulated that she has now quit smoking for the last 8 days.  She has no other questions or concerns today.    Vitals:   10/27/19 1000  BP: (!) 170/92  Pulse: 71     EXAM:  Resp: Lungs are clear bilaterally.  No respiratory distress, normal effort. Heart:  Regular without murmurs Abd:  Abdomen is soft, non distended and non tender. No masses are palpable.  There is no rebound and no guarding.  Neurological: Alert and oriented to person, place, and time. Coordination normal.  Skin: Skin is warm and dry. No rash noted. No diaphoretic. No erythema. No pallor.  Psychiatric: Normal mood and affect. Normal behavior. Judgment and thought content normal.    I independently reviewed her chest CT and PET scan with her and her husband again.  ASSESSMENT: There are 2 lung lesions in the right upper lobe.  There is a dominant lesion in the lateral aspect as well as a second groundglass opacity just above the fissure to the middle lobe.   PLAN:   I discussed with her the indications and risks of right thoracotomy and right upper lobectomy.  Risks of bleeding, infection, air leak and death were all reviewed.  A 5% mortality was quoted.  We also discussed the role of radiation therapy.  After extensive discussion with her and her husband they have agreed to proceed with right thoracotomy and right lung resection.    Nestor Lewandowsky, MD

## 2019-10-27 NOTE — Patient Instructions (Addendum)
We have discussed taking out a lobe of your lung to remove the tumor. We have arranged this to be done on 11/04/19 by Dr. Genevive Bi at [facility].  You will be in the hospital for 7 days for surgery and recovery. During that period of time, you will most likely be in the Intensive Care Unit for 1-2 days. Expect to be completely back to yourself within 3 months of the date of surgery.  Do NOT take any products containing Aspirin or Ibuprofen 5 days prior to your surgery. (Ex.- Goody Powder, Excedrin, Advil, Aleve, Ibuprofen, and Aspirin). You may take Tylenol if you need something for pain.   Please bring in any FMLA paperwork or short-term disability before your scheduled surgery and we will fill this out within 3 days of your surgery being completed.  Please refer to your [facility]) Pre-care sheet that you have been given today. This has all of your instructions on it.  Call and ask to speak with a nurse if you have any questions or concerns regarding your surgery.    Lung Resection A lung resection is a procedure to remove part or all of a lung. When an entire lung is removed, the procedure is called a pneumonectomy. When only part of a lung is removed, the procedure is called a lobectomy. A lung resection is typically done to get rid of a tumor or cancer, but it may be done to treat other conditions. This procedure can help relieve some or all of your symptoms and can also help keep the problem from getting worse. Lung resection may provide the best chance for curing your disease. However, the procedure may not necessarily cure lung cancer if that is the problem. Tell a health care provider about:  Any allergies you have.  All medicines you are taking, including vitamins, herbs, eye drops, creams, and over-the-counter medicines.  Any problems you or family members have had with anesthetic medicines.  Any blood disorders you have.  Any surgeries you have had.  Any medical conditions you have. What are  the risks? Generally, lung resection is a safe procedure. However, problems can occur and include:  Excessive bleeding.  Infection.  Inability to breathe without a ventilator.  Persistent shortness of breath.  Heart problems, including abnormal rhythms and a risk of heart attack or heart failure.  Blood clots.  Injury to a blood vessel.  Injury to a nerve.  Failure to heal properly.  Stroke.  Bronchopleural fistula. This is a small hole between one of the main breathing tubes (bronchus) and the lining of the lungs. This is rare.  Reaction to anesthesia.  What happens before the procedure? You may have tests done before the procedure, including:  Blood tests.  Urine tests.  X-rays.  Other imaging tests (such as CT scans, MRI scans, and PET scans). These tests are done to find the exact size and location of the condition being treated with this surgery.  Pulmonary function tests. These are breathing tests to assess the function of your lungs before surgery and to decide how to best help your breathing after surgery.  Heart testing. This is done to make sure your heart is strong enough for the procedure.  Bronchoscopy. This is a technique that allows your health care provider to look at the inside of your airways. This is done using a soft, flexible tube (bronchoscope). Along with imaging tests, this can help your health care provider know the exact location and size of the area that  will be removed during surgery.  Lymph node sampling. This may need to be done to see if the tumor has spread. It may be done as a separate surgery or right before your lung resection procedure.  What happens during the procedure?  An IV tube will be placed in your arm. You will be given a medicine that makes you fall asleep (general anesthetic). You may also get pain medicine through a thin, flexible tube (catheter) in your back.  A breathing tube will be placed in your throat.  Once the  surgical team has prepared you for surgery, your surgeon will make an incision on your side. Some resections are done through large incisions, while others can be done through small incisions using smaller instruments and assisted with small cameras (laparoscopic surgery).  Your surgeon will carefully cut the veins, arteries, and bronchus leading to your lung. After being cut, each of these pieces will be sewn or stapled closed. The lung or part of the lung will then be removed.  Your surgeon will check inside your chest to make sure there is no bleeding in or around the lungs. Lymph nodes near the lung may also be removed for later tests.  Your surgeon may put tubes into your chest to drain extra fluid and air after surgery.  Your incision will be closed. This may be done using: ? Stitches that absorb into your body and do not need to be removed. ? Stitches that must be removed. ? Staples that must be removed. What happens after the procedure?  You will be taken to the recovery area and your progress will be monitored. You may still have a breathing tube and other tubes or catheters in your body immediately after surgery. These will be removed during your recovery. You may be put on a respirator following surgery if some assistance is needed to help your breathing. When you are awake and not experiencing immediate problems from surgery, you will be moved to the intensive care unit (ICU) where you will continue your recovery.  You may feel pain in your chest and throat. Sometimes during recovery, patients may shiver or feel nauseous. You will be given medicine to help with pain and nausea.  The breathing tube will be taken out as soon as your health care providers feel you can breathe on your own. For most people, this happens on the same day as the surgery.  If your surgery and time in the ICU go well, most of the tubes and equipment will be taken out within 1-2 days after surgery. This is about  how long most people stay in the ICU. You may need to stay longer, depending on how you are doing.  You should also start respiratory therapy in the ICU. This therapy uses breathing exercises to help your other lung stay healthy and get stronger.  As you improve, you will be moved to a regular hospital room for continued respiratory therapy, help with your bladder and bowels, and to continue medicines.  After your lung or part of your lung is taken out, there will be a space inside your chest. This space will often fill up with fluid over time. The amount of time this takes is different for each person.  You will receive care until you are doing well and your health care provider feels it is safe for you to go home or to transfer to an extended care facility. This information is not intended to replace advice given to  you by your health care provider. Make sure you discuss any questions you have with your health care provider. Document Released: 03/11/2002 Document Revised: 05/27/2015 Document Reviewed: 02/07/2013 Elsevier Interactive Patient Education  2018 [employer].

## 2019-10-28 ENCOUNTER — Encounter: Payer: Self-pay | Admitting: *Deleted

## 2019-10-28 ENCOUNTER — Encounter
Admission: RE | Admit: 2019-10-28 | Discharge: 2019-10-28 | Disposition: A | Discharge status: Home / Self Care | Payer: Medicare Other | Source: Ambulatory Visit | Attending: Cardiothoracic Surgery | Admitting: Cardiothoracic Surgery

## 2019-10-28 HISTORY — DX: Nontoxic multinodular goiter: E04.2

## 2019-10-28 HISTORY — DX: Chronic obstructive pulmonary disease, unspecified: J44.9

## 2019-10-28 NOTE — Patient Instructions (Signed)
Your procedure is scheduled on: Tuesday November 04, 2019 Report to Day Surgery on the 2nd floor of the [facility]. To find out your arrival time, please call [phone] between 1PM - 3PM on: Monday November 03, 2019  REMEMBER: Instructions that are not followed completely may result in serious medical risk, up to and including death; or upon the discretion of your surgeon and anesthesiologist your surgery may need to be rescheduled.  Do not eat food after midnight the night before surgery.  No gum chewing, lozengers or hard candies.  You may however, drink CLEAR liquids up to 2 hours before you are scheduled to arrive for your surgery. Do not drink anything within 2 hours of your scheduled arrival time.  Clear liquids include: - water  - apple juice without pulp - gatorade (not RED, PURPLE, OR BLUE) - black coffee or tea (Do NOT add milk or creamers to the coffee or tea) Do NOT drink anything that is not on this list.  TAKE THESE MEDICATIONS THE MORNING OF SURGERY WITH A SIP OF WATER: METOPROLOL  Use inhalers on the day of surgery   Follow recommendations from Cardiologist, Pulmonologist or PCP regarding stopping Aspirin.  One week prior to surgery: Stop Anti-inflammatories (NSAIDS) such as Advil, Aleve, Ibuprofen, Motrin, Naproxen, Naprosyn and Aspirin based products such as Excedrin, Goodys Powder, BC Powder. Stop ANY OVER THE COUNTER supplements until after surgery.  USE ONLY TYLENOL  No Alcohol for 24 hours before or after surgery.  No Smoking including e-cigarettes for 24 hours prior to surgery.  No chewable tobacco products for at least 6 hours prior to surgery.  No nicotine patches on the day of surgery.  Do not use any "recreational" drugs for at least a week prior to your surgery.  Please be advised that the combination of cocaine and anesthesia may have negative outcomes, up to and including death. If you test positive for cocaine, your surgery will be  cancelled.  On the morning of surgery brush your teeth with toothpaste and water, you may rinse your mouth with mouthwash if you wish. Do not swallow any toothpaste or mouthwash.  Do not wear jewelry, make-up, hairpins, clips or nail polish.  Do not wear lotions, powders, or perfumes OR DEODORANT   Do not shave 48 hours prior to surgery.   Contact lenses, hearing aids and dentures may not be worn into surgery.  Do not bring valuables to the hospital. [facility] is not responsible for any missing/lost belongings or valuables.   Use CHG Soap as directed on instruction sheet.  Notify your doctor if there is any change in your medical condition (cold, fever, infection).  Wear comfortable clothing (specific to your surgery type) to the hospital.  Plan for stool softeners for home use; pain medications have a tendency to cause constipation. You can also help prevent constipation by eating foods high in fiber such as fruits and vegetables and drinking plenty of fluids as your diet allows.  After surgery, you can help prevent lung complications by doing breathing exercises.  Take deep breaths and cough every 1-2 hours. Your doctor may order a device called an Incentive Spirometer to help you take deep breaths. When coughing or sneezing, hold a pillow firmly against your incision with both hands. This is called "splinting." Doing this helps protect your incision. It also decreases belly discomfort.  If you are being admitted to the hospital overnight, [place].  If you are being discharged the  day of surgery, you will not be allowed to drive home. You will need a responsible adult (18 years or older) to drive you home and stay with you that night.    Please call the [facility] Dept. at [phone] if you have any questions about these instructions.  Visitation Policy:  Patients undergoing a surgery or procedure may have one family member or support person  with them as long as that person is not COVID-19 positive or experiencing its symptoms.  That person may remain in the waiting area during the procedure.  Inpatient Visitation Update:   In an effort to ensure the safety of our team members and our patients, we are implementing a change to our visitation policy:  Effective Monday, Aug. 9, at 7 a.m., inpatients will be allowed one support person.  o The support person may change daily.  o The support person must pass our screening, gel in and out, and wear a mask at all times, including in the patient's room.  o Patients must also wear a mask when staff or their support person are in the room.  o Masking is required regardless of vaccination status.  Systemwide, no visitors 17 or younger.

## 2019-10-31 ENCOUNTER — Other Ambulatory Visit: Payer: Self-pay

## 2019-10-31 ENCOUNTER — Ambulatory Visit
Admission: RE | Admit: 2019-10-31 | Discharge: 2019-10-31 | Disposition: A | Discharge status: Home / Self Care | Payer: Medicare Other | Source: Ambulatory Visit | Attending: Cardiothoracic Surgery | Admitting: Cardiothoracic Surgery

## 2019-10-31 ENCOUNTER — Encounter
Admission: RE | Admit: 2019-10-31 | Discharge: 2019-10-31 | Disposition: A | Discharge status: Home / Self Care | Payer: Medicare Other | Source: Ambulatory Visit | Attending: Cardiothoracic Surgery | Admitting: Cardiothoracic Surgery

## 2019-10-31 DIAGNOSIS — Z20822 Contact with and (suspected) exposure to covid-19: Secondary | ICD-10-CM | POA: Insufficient documentation

## 2019-10-31 DIAGNOSIS — Z01818 Encounter for other preprocedural examination: Secondary | ICD-10-CM | POA: Insufficient documentation

## 2019-10-31 DIAGNOSIS — Z01810 Encounter for preprocedural cardiovascular examination: Secondary | ICD-10-CM

## 2019-10-31 LAB — CBC WITH DIFFERENTIAL/PLATELET
Abs Immature Granulocytes: 0.01 10*3/uL (ref 0.00–0.07)
Basophils Absolute: 0.1 10*3/uL (ref 0.0–0.1)
Basophils Relative: 1 %
Eosinophils Absolute: 0.1 10*3/uL (ref 0.0–0.5)
Eosinophils Relative: 1 %
HCT: 38.5 % (ref 36.0–46.0)
Hemoglobin: 12.7 g/dL (ref 12.0–15.0)
Immature Granulocytes: 0 %
Lymphocytes Relative: 41 %
Lymphs Abs: 2.5 10*3/uL (ref 0.7–4.0)
MCH: 32.1 pg (ref 26.0–34.0)
MCHC: 33 g/dL (ref 30.0–36.0)
MCV: 97.2 fL (ref 80.0–100.0)
Monocytes Absolute: 0.5 10*3/uL (ref 0.1–1.0)
Monocytes Relative: 8 %
Neutro Abs: 3 10*3/uL (ref 1.7–7.7)
Neutrophils Relative %: 49 %
Platelets: 253 10*3/uL (ref 150–400)
RBC: 3.96 MIL/uL (ref 3.87–5.11)
RDW: 15.4 % (ref 11.5–15.5)
WBC: 6.2 10*3/uL (ref 4.0–10.5)
nRBC: 0 % (ref 0.0–0.2)

## 2019-10-31 LAB — SURGICAL PCR SCREEN
MRSA, PCR: NEGATIVE
Staphylococcus aureus: NEGATIVE

## 2019-10-31 LAB — PROTIME-INR
INR: 0.9 (ref 0.8–1.2)
Prothrombin Time: 12 seconds (ref 11.4–15.2)

## 2019-10-31 LAB — COMPREHENSIVE METABOLIC PANEL
ALT: 32 U/L (ref 0–44)
AST: 25 U/L (ref 15–41)
Albumin: 3.6 g/dL (ref 3.5–5.0)
Alkaline Phosphatase: 68 U/L (ref 38–126)
Anion gap: 10 (ref 5–15)
BUN: 19 mg/dL (ref 8–23)
CO2: 28 mmol/L (ref 22–32)
Calcium: 8.6 mg/dL — ABNORMAL LOW (ref 8.9–10.3)
Chloride: 104 mmol/L (ref 98–111)
Creatinine, Ser: 0.64 mg/dL (ref 0.44–1.00)
GFR, Estimated: >60 mL/min (ref >60)
Glucose, Bld: 111 mg/dL — ABNORMAL HIGH (ref 70–99)
Potassium: 3.3 mmol/L — ABNORMAL LOW (ref 3.5–5.1)
Sodium: 142 mmol/L (ref 135–145)
Total Bilirubin: 0.6 mg/dL (ref 0.3–1.2)
Total Protein: 6.3 g/dL — ABNORMAL LOW (ref 6.5–8.1)

## 2019-10-31 LAB — APTT: aPTT: 28 seconds (ref 24–36)

## 2019-10-31 NOTE — Progress Notes (Signed)
[facility] Perioperative Services  Pre-Admission/Anesthesia Testing Clinical Review  Date: 10/31/19  Patient Demographics:  Name: Ann [name] DOB:   15-Jun-1955 MRN:   [id number]  Planned Surgical Procedure(s):    Case: [id number] Date/Time: 11/04/19 0715   Procedures:      PRE-OPERATIVE BRONCHOSCOPY (Right )     THORACOTOMY/LOBECTOMY (Right )   Anesthesia type: General   Pre-op diagnosis: Right lung mass   Location: [facility] OR ROOM 07 / [facility]EST[facility]   Surgeons: Nestor Lewandowsky, MD     NOTE: Available PAT nursing documentation and vital signs have been reviewed. Clinical nursing staff has updated patient's PMH/PSHx, current medication list, and drug allergies/intolerances to ensure comprehensive history available to assist in medical decision making as it pertains to the aforementioned surgical procedure and anticipated anesthetic course.   Clinical Discussion:  Ann [name] is a 64 y.o. female who is submitted for pre-surgical anesthesia review and clearance prior to her undergoing the above procedure. Patient is a Former Smoker (quit 10/2019). Pertinent PMH includes: CAD (s/p PCI), MI, carotid stenosis (s/p stent placement), CVA, HTN, COPD.  Given patient's age and extended smoking history, patient was referred to the lung cancer screening program at [facility].  As part of her work-up and screening, patient underwent low-dose chest CT on 09/23/2019; imaging and dictated radiology report reviewed.  Patient with a suspicious RUL nodule (lung RADS 4A) noted on her imaging that was felt to be highly suspicious for pulmonary malignancy.  PET/CT was recommended to exclude possible neoplasm.  Additionally, there was aortic atherosclerosis, in addition to left main and three-vessel CAD.  Patient underwent PET/CT imaging on 10/02/2019 that revealed the following:   Hypermetabolic spiculated nodule in the RUL that measured  1.2 cm (SUV 2.6)  Groundglass RML nodule measuring 7 mm (no FDG uptake)  Enlarged right thyroid lobe measuring 3.4 x 2.4 cm (SUV 4.8)  Intense hypermetabolic activity at the level of the distal rectum/anus (SUV 7.0); minimal activity elsewhere in the bowel  Following PET/CT imaging suggestive of bronchogenic carcinoma, patient was referred to CVTS Genevive Bi, MD).  Patient was seen in consult on 10/10/2019 by Dr. Nestor Lewandowsky; notes reviewed.  At the time of her clinic visit, patient had no significant shortness of breath or other pulmonary limitations.  Patient able to walk up and down steps without shortness of breath.  Imaging reviewed with patient, and concerns for bronchogenic carcinoma was discussed.  Additionally, PET scan suggestive of a possible early thyroid carcinoma.  Recommendations were for a right thoracotomy and RUL lobectomy versus XRT alone.  Patient scheduled to follow-up with pulmonary medicine and cardiology for pulmonary evaluation and presurgical clearance.  Additionally patient scheduled to follow-up with general surgery to discuss further evaluation of thyroid concern.     Patient was seen in consult on 10/21/2019 by Dr. [name]; notes reviewed.  MD reviewed recent PET scan and noted a 30-50% risk of malignancy and PET avid thyroid nodules.  FNA was recommended, however patient electing to defer biopsy until after 1 procedure.  FNA to be performed in early December.   Patient is followed by cardiology Caryl Comes, MD). She was last seen in the cardiology clinic on 09/30/2019; notes reviewed.  At the time of her last clinic visit, patient doing well from a cardiovascular standpoint.  Patient denied angina, shortness of breath, PND, orthopnea, palpitations, peripheral edema, vertiginous symptoms, and presyncope/syncope.  Last TTE was done and 09/2018 revealing normal  LV systolic function with mild LVH; LVEF 55% (see full interpretation of cardiovascular testing below).  Blood  pressure poorly controlled on diuretic and beta-blocker therapy. Recommendations were for patient to establish care with endocrinology given recent hyperaldosteronism diagnosis; referral pending.  Patient also sees Dr. Saralyn Pilar; last visit 10/14/2019.  At that time, patient's blood pressure under better control on current regimen.  Patient with loop recorder in place.  No significant changes were noted patient's past medical history; no changes to current medication regimen.  Patient scheduled follow-up with outpatient cardiology in 6 months.   Patient was seen in consult on 10/15/2019 by Dr. Ottie Glazier; notes reviewed.  Pulmonary function testing was performed that demonstrated mild obstructive ventilatory defect with mild decrement in TLC likely due to nitrogen washout technique a moderate decrease in DLCO at 68%; findings consistent with COPD GOLD stage I.  Functional capacity documented as being greater than 4 METS.  Patient scheduled to undergo thoracotomy and pulmonary lobectomy on 11/04/2019 with Dr. Nestor Lewandowsky.  Given patient's past medical history significant for cardiopulmonary issues, presurgical cardiac clearance was sought by the performing surgeon's office.  Clearance requested from patient's PCP, PCCM, and cardiology.  Clearances received as follows:   Per cardiology, "based on ACC/AHA guidelines, Ann [name] would be at acceptable risk for the planned procedure without further cardiovascular testing". This patient is on daily antiplatelet therapy. She has been instructed on recommendations for holding her daily FULL doses ASA  for 5-7 days prior to her procedure.    Per pulmonology, "ARISCAT Ann [name]) Preoperative pulmonary risk index in adults- low risk: 1.6% pulmonary postoperative complication rate. [place] respiratory failure index- low risk: 1.8% pulmonary postoperative complication rate. Lyndel Safe calculator for postoperative respiratory failure- low risk: 1.07% probability of  postoperative respiratory failure.   Per internal medicine, "patient cleared to proceed with surgery from a medical standpoint. She is at a MODERATE to HIGH risk for surgery".   She denies previous perioperative complications with anesthesia.   Vitals with BMI 10/28/2019 10/27/2019 10/21/2019  Height 5\' 3"  5\' 3"  5\' 3"   Weight 108 lbs 108 lbs 107 lbs 3 oz  BMI 19.14 21.30 86.57  Systolic - 846 962  Diastolic - 92 952  Pulse - 71 65    Providers/Specialists:   NOTE: Primary physician provider listed below. Patient may have been seen by APP or partner within same practice.   PROVIDER ROLE LAST Blima Singer, MD CVTS (Surgeon) 10/10/2019  Kirk Ruths, MD Primary Care Provider 10/16/2019  Ottie Glazier, MD Pulmonology 10/15/2019  Isaias Cowman, MD Cardiology 10/14/2019  Virl Axe, MD Cardiology/Electrophysiology 09/30/2019  [name] General Surgery 10/21/2019   Allergies:  Clopidogrel  Current Home Medications:   No current facility-administered medications for this encounter.   Marland Kitchen aspirin 325 MG tablet  . fluticasone (FLONASE) 50 MCG/ACT nasal spray  . furosemide (LASIX) 20 MG tablet  . metoprolol tartrate (LOPRESSOR) 25 MG tablet  . rosuvastatin (CRESTOR) 20 MG tablet  . spironolactone (ALDACTONE) 25 MG tablet  . TRELEGY ELLIPTA 100-62.5-25 MCG/INH AEPB   History:   Past Medical History:  Diagnosis Date  . COPD (chronic obstructive pulmonary disease) ([place])   . Coronary artery disease    Prior stenting  . Hypertension Hypoxic  . Hypokalemia   . Multiple thyroid nodules   . Myocardial infarction ([place])   . Stroke [name]) 2005   TIA  . Syncope    Past Surgical History:  Procedure Laterality Date  . BACK SURGERY  DECOMRESSION L4-5 WITH RODS AND FUSION  . CAROTID STENT  07/18/2003  . NECK SURGERY    . TONSILLECTOMY    . TUBAL LIGATION     Family History  Problem Relation Age of Onset  . Alzheimer's disease Mother   . Heart  attack Father   . Diabetes Mellitus II Brother   . Diabetes Mellitus II Brother    Social History   Tobacco Use  . Smoking status: Former Smoker    Packs/day: 0.25    Years: 49.00    Pack years: 12.25    Types: Cigarettes    Quit date: 10/17/2019    Years since quitting: 0.0  . Smokeless tobacco: Never Used  . Tobacco comment: 1 pack lasts 3-4 days  Vaping Use  . Vaping Use: Never used  Substance Use Topics  . Alcohol use: Yes    Comment: 1 shot of vodka with lemonade every evening  . Drug use: Never    Pertinent Clinical Results:  LABS: Labs reviewed: Acceptable for surgery.  [name] Visit on 10/31/2019  Component Date Value Ref Range Status  . MRSA, PCR 10/31/2019 NEGATIVE  NEGATIVE Final  . Staphylococcus aureus 10/31/2019 NEGATIVE  NEGATIVE Final   Comment: (NOTE) The Xpert SA Assay (FDA approved for NASAL specimens in patients 21 years of age and older), is one component of a comprehensive surveillance program. It is not intended to diagnose infection nor to guide or monitor treatment. Performed at [name], [address] [place], [place] [address]   . aPTT 10/31/2019 28  24 - 36 seconds Final   Performed at [facility], [address] [place], [place] [address]  . WBC 10/31/2019 6.2  4.0 - 10.5 K/uL Final  . RBC 10/31/2019 3.96  3.87 - 5.11 MIL/uL Final  . Hemoglobin 10/31/2019 12.7  12.0 - 15.0 g/dL Final  . HCT 10/31/2019 38.5  36 - 46 % Final  . MCV 10/31/2019 97.2  80.0 - 100.0 fL Final  . MCH 10/31/2019 32.1  26.0 - 34.0 pg Final  . MCHC 10/31/2019 33.0  30.0 - 36.0 g/dL Final  . RDW 10/31/2019 15.4  11.5 - 15.5 % Final  . Platelets 10/31/2019 253  150 - 400 K/uL Final  . nRBC 10/31/2019 0.0  0.0 - 0.2 % Final  . Neutrophils Relative % 10/31/2019 49  % Final  . Neutro Abs 10/31/2019 3.0  1.7 - 7.7 K/uL Final  . Lymphocytes Relative 10/31/2019 41  % Final  . Lymphs Abs 10/31/2019 2.5  0.7 - 4.0 K/uL Final  .  Monocytes Relative 10/31/2019 8  % Final  . Monocytes Absolute 10/31/2019 0.5  0.1 - 1.0 K/uL Final  . Eosinophils Relative 10/31/2019 1  % Final  . Eosinophils Absolute 10/31/2019 0.1  0.0 - 0.5 K/uL Final  . Basophils Relative 10/31/2019 1  % Final  . Basophils Absolute 10/31/2019 0.1  0.0 - 0.1 K/uL Final  . Immature Granulocytes 10/31/2019 0  % Final  . Abs Immature Granulocytes 10/31/2019 0.01  0.00 - 0.07 K/uL Final   Performed at [name], [address], [place], [place] [address]  . Sodium 10/31/2019 142  135 - 145 mmol/L Final  . Potassium 10/31/2019 3.3* 3.5 - 5.1 mmol/L Final  . Chloride 10/31/2019 104  98 - 111 mmol/L Final  . CO2 10/31/2019 28  22 - 32 mmol/L Final  . Glucose, Bld 10/31/2019 111* 70 - 99 mg/dL Final   Glucose reference range  applies only to samples taken after fasting for at least 8 hours.  . BUN 10/31/2019 19  8 - 23 mg/dL Final  . Creatinine, Ser 10/31/2019 0.64  0.44 - 1.00 mg/dL Final  . Calcium 10/31/2019 8.6* 8.9 - 10.3 mg/dL Final  . Total Protein 10/31/2019 6.3* 6.5 - 8.1 g/dL Final  . Albumin 10/31/2019 3.6  3.5 - 5.0 g/dL Final  . AST 10/31/2019 25  15 - 41 U/L Final  . ALT 10/31/2019 32  0 - 44 U/L Final  . Alkaline Phosphatase 10/31/2019 68  38 - 126 U/L Final  . Total Bilirubin 10/31/2019 0.6  0.3 - 1.2 mg/dL Final  . GFR, Estimated 10/31/2019 >60  >60 mL/min Final   Comment: (NOTE) Calculated using the CKD-EPI Creatinine Equation (2021)   . Anion gap 10/31/2019 10  5 - 15 Final   Performed at [facility], [address] [place], [place] [address]  . Prothrombin Time 10/31/2019 12.0  11.4 - 15.2 seconds Final  . INR 10/31/2019 0.9  0.8 - 1.2 Final   Comment: (NOTE) INR goal varies based on device and disease states. Performed at [facility], [address] [place], [place] [address]     ECG: Date: 10/31/2019 Time ECG obtained: 1040 AM Rate: 63 bpm Rhythm: Sinus rhythm with first-degree AV  block Axis (leads I and aVF): Normal Intervals: PR 230 ms. QRS 86 ms. QTc 466 ms. ST segment and T wave changes: No evidence of acute ST segment elevation or depression Comparison: Similar to previous tracing obtained on 09/18/2019   IMAGING / PROCEDURES: PULMONARY FUNCTION TESTING done on 10/15/2019 1. Mild obstructive ventilatory defect with mild decrement in TLC likely due to nitrogen washout technique and moderate decrease in DLCO at 68%. Findings consistent with COPD GOLD stage 1.    SPIROMETRY: FVC was 2.38 liters, 103% of predicted FEV1 was 1.53, 83% of predicted FEV1 ratio was 65 FEF 25-75% liters per second was 28% of predicted *Patient only did 2 efforts, which were consistent. Patient complained of chest discomfort. Efforts were stopped and Post was not done. Patient had Cardiac Loop insertion 9/28.   LUNG VOLUMES: TLC was 69% of predicted RV was 47% of predicted   DIFFUSION CAPACITY: DLCO was 68% of predicted DLCO/VA was 87% of predicted   FLOW VOLUME LOOP: Scooping of expiratory limb of flow volume loop suggestive of obstructive physiology.  PET IMAGING SKULL TO BASE OF THIGH done on 10/02/2019 1. 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 FDG PET staging T1a N0 M0. 2. Ground-glass nodule RIGHT middle lobe. Recommend attention on follow-up. 3. Incidental finding of enlarged RIGHT lobe of thyroid gland with hypermetabolic activity. Recommend thyroid ultrasound to evaluate for thyroid carcinoma versus adenoma. 4. Incidental finding of intense metabolic activity at the level of the distal rectum/anus. This is favored physiologic and may relate to stool however there is minimal activity elsewhere in the bowel. Recommend physical exam/digital rectal exam.  ECHOCARDIOGRAM done on 10/01/2018 1. LVEF 55% 2. Normal LV systolic function with mild LVH 3. Normal LV systolic function 4. Trivial MR; mild TR; no AR or  PR 5. No valvular stenosis  Impression and Plan:  Ann [name] has been referred for pre-anesthesia review and clearance prior to her undergoing the planned anesthetic and procedural courses. Available labs, pertinent testing, and imaging results were personally reviewed by me. This patient has been appropriately cleared by internal medicine Ouida Sills, MD), pulmonary medicine Lanney Gins,  MD), and cardiology/electrophysiology Caryl Comes, MD).   Based on clinical review performed today (10/31/19), barring any significant acute changes in the patient's overall condition, it is anticipated that she will be able to proceed with the planned surgical intervention. Any acute changes in clinical condition may necessitate her procedure being postponed and/or cancelled. Pre-surgical instructions were reviewed with the patient during her PAT appointment and questions were fielded by PAT clinical staff.  Honor Loh, MSN, APRN, FNP-C, CEN [facility]  Peri-operative Services Nurse Practitioner Phone: [phone] 10/31/19 3:21 PM  NOTE: This note has been prepared using Dragon dictation software. Despite my best ability to proofread, there is always the potential that unintentional transcriptional errors may still occur from this process.

## 2019-11-01 LAB — SARS CORONAVIRUS 2 (TAT 6-24 HRS): SARS Coronavirus 2: NEGATIVE

## 2019-11-03 MED ORDER — CEFAZOLIN SODIUM-DEXTROSE 2-4 GM/100ML-% IV SOLN
2.0000 g | INTRAVENOUS | Status: AC
  Administered 2019-11-04: 2 g via INTRAVENOUS

## 2019-11-03 MED ORDER — CHLORHEXIDINE GLUCONATE 0.12 % MT SOLN: 15.0000 mL | Freq: Once | OROMUCOSAL | Status: AC

## 2019-11-03 MED ORDER — ORAL CARE MOUTH RINSE: 15.0000 mL | Freq: Once | OROMUCOSAL | Status: AC

## 2019-11-03 MED ORDER — LACTATED RINGERS IV SOLN: INTRAVENOUS | Status: DC

## 2019-11-03 MED ORDER — FAMOTIDINE 20 MG PO TABS: 20.0000 mg | ORAL_TABLET | Freq: Once | ORAL | Status: AC

## 2019-11-04 ENCOUNTER — Encounter: Payer: Self-pay | Admitting: Cardiothoracic Surgery

## 2019-11-04 ENCOUNTER — Inpatient Hospital Stay
Admission: RE | Admit: 2019-11-04 | Discharge: 2019-11-11 | DRG: 164 | Disposition: A | Discharge status: Home Health | Payer: Medicare Other | Attending: Cardiothoracic Surgery | Admitting: Cardiothoracic Surgery

## 2019-11-04 ENCOUNTER — Inpatient Hospital Stay: Payer: Medicare Other | Admitting: Urgent Care

## 2019-11-04 ENCOUNTER — Encounter
Admission: RE | Disposition: A | Discharge status: Home Health | Payer: Self-pay | Source: Home / Self Care | Attending: Cardiothoracic Surgery

## 2019-11-04 ENCOUNTER — Other Ambulatory Visit: Payer: Self-pay

## 2019-11-04 ENCOUNTER — Inpatient Hospital Stay: Payer: Medicare Other

## 2019-11-04 DIAGNOSIS — K567 Ileus, unspecified: Secondary | ICD-10-CM

## 2019-11-04 DIAGNOSIS — R918 Other nonspecific abnormal finding of lung field: Secondary | ICD-10-CM | POA: Diagnosis present

## 2019-11-04 DIAGNOSIS — Z87891 Personal history of nicotine dependence: Secondary | ICD-10-CM | POA: Diagnosis not present

## 2019-11-04 DIAGNOSIS — D72829 Elevated white blood cell count, unspecified: Secondary | ICD-10-CM | POA: Diagnosis present

## 2019-11-04 DIAGNOSIS — E876 Hypokalemia: Secondary | ICD-10-CM | POA: Diagnosis present

## 2019-11-04 DIAGNOSIS — Z902 Acquired absence of lung [part of]: Secondary | ICD-10-CM

## 2019-11-04 DIAGNOSIS — C3411 Malignant neoplasm of upper lobe, right bronchus or lung: Secondary | ICD-10-CM | POA: Diagnosis present

## 2019-11-04 DIAGNOSIS — Z8249 Family history of ischemic heart disease and other diseases of the circulatory system: Secondary | ICD-10-CM

## 2019-11-04 DIAGNOSIS — I2510 Atherosclerotic heart disease of native coronary artery without angina pectoris: Secondary | ICD-10-CM | POA: Diagnosis present

## 2019-11-04 DIAGNOSIS — Z833 Family history of diabetes mellitus: Secondary | ICD-10-CM

## 2019-11-04 DIAGNOSIS — E785 Hyperlipidemia, unspecified: Secondary | ICD-10-CM | POA: Diagnosis present

## 2019-11-04 DIAGNOSIS — Z8673 Personal history of transient ischemic attack (TIA), and cerebral infarction without residual deficits: Secondary | ICD-10-CM | POA: Diagnosis not present

## 2019-11-04 DIAGNOSIS — I10 Essential (primary) hypertension: Secondary | ICD-10-CM | POA: Diagnosis present

## 2019-11-04 DIAGNOSIS — Z09 Encounter for follow-up examination after completed treatment for conditions other than malignant neoplasm: Secondary | ICD-10-CM

## 2019-11-04 DIAGNOSIS — I639 Cerebral infarction, unspecified: Secondary | ICD-10-CM | POA: Diagnosis not present

## 2019-11-04 DIAGNOSIS — Z820 Family history of epilepsy and other diseases of the nervous system: Secondary | ICD-10-CM

## 2019-11-04 DIAGNOSIS — R911 Solitary pulmonary nodule: Secondary | ICD-10-CM

## 2019-11-04 DIAGNOSIS — J449 Chronic obstructive pulmonary disease, unspecified: Secondary | ICD-10-CM | POA: Diagnosis present

## 2019-11-04 DIAGNOSIS — I252 Old myocardial infarction: Secondary | ICD-10-CM

## 2019-11-04 DIAGNOSIS — D0221 Carcinoma in situ of right bronchus and lung: Secondary | ICD-10-CM | POA: Diagnosis not present

## 2019-11-04 DIAGNOSIS — Z9689 Presence of other specified functional implants: Secondary | ICD-10-CM

## 2019-11-04 DIAGNOSIS — Z01810 Encounter for preprocedural cardiovascular examination: Secondary | ICD-10-CM

## 2019-11-04 DIAGNOSIS — Z20822 Contact with and (suspected) exposure to covid-19: Secondary | ICD-10-CM | POA: Diagnosis present

## 2019-11-04 HISTORY — PX: THORACOTOMY/LOBECTOMY: SHX6116

## 2019-11-04 LAB — CBC
HCT: 40.8 % (ref 36.0–46.0)
Hemoglobin: 13.6 g/dL (ref 12.0–15.0)
MCH: 32.6 pg (ref 26.0–34.0)
MCHC: 33.3 g/dL (ref 30.0–36.0)
MCV: 97.8 fL (ref 80.0–100.0)
Platelets: 242 10*3/uL (ref 150–400)
RBC: 4.17 MIL/uL (ref 3.87–5.11)
RDW: 15.3 % (ref 11.5–15.5)
WBC: 12 10*3/uL — ABNORMAL HIGH (ref 4.0–10.5)
nRBC: 0 % (ref 0.0–0.2)

## 2019-11-04 LAB — BASIC METABOLIC PANEL
Anion gap: 10 (ref 5–15)
BUN: 10 mg/dL (ref 8–23)
CO2: 25 mmol/L (ref 22–32)
Calcium: 8.5 mg/dL — ABNORMAL LOW (ref 8.9–10.3)
Chloride: 101 mmol/L (ref 98–111)
Creatinine, Ser: 0.65 mg/dL (ref 0.44–1.00)
GFR, Estimated: >60 mL/min (ref >60)
Glucose, Bld: 147 mg/dL — ABNORMAL HIGH (ref 70–99)
Potassium: 3.3 mmol/L — ABNORMAL LOW (ref 3.5–5.1)
Sodium: 136 mmol/L (ref 135–145)

## 2019-11-04 LAB — GLUCOSE, CAPILLARY: Glucose-Capillary: 138 mg/dL — ABNORMAL HIGH (ref 70–99)

## 2019-11-04 LAB — ABO/RH: ABO/RH(D): A POS

## 2019-11-04 SURGERY — BRONCHOSCOPY
Anesthesia: General | Laterality: Right

## 2019-11-04 MED ORDER — CHLORHEXIDINE GLUCONATE 0.12 % MT SOLN
OROMUCOSAL | Status: AC
  Administered 2019-11-04: 15 mL via OROMUCOSAL
  Filled 2019-11-04: qty 15

## 2019-11-04 MED ORDER — OXYCODONE HCL 5 MG/5ML PO SOLN: 5.0000 mg | Freq: Once | ORAL | Status: DC | PRN

## 2019-11-04 MED ORDER — BISACODYL 5 MG PO TBEC
10.0000 mg | DELAYED_RELEASE_TABLET | Freq: Every day | ORAL | Status: DC
  Administered 2019-11-05 – 2019-11-11 (×7): 10 mg via ORAL
  Filled 2019-11-04 (×7): qty 2

## 2019-11-04 MED ORDER — BUPIVACAINE HCL 0.25 % IJ SOLN
INTRAMUSCULAR | Status: DC | PRN
  Administered 2019-11-04: 30 mL

## 2019-11-04 MED ORDER — ALBUTEROL SULFATE (2.5 MG/3ML) 0.083% IN NEBU
2.5000 mg | INHALATION_SOLUTION | RESPIRATORY_TRACT | Status: DC
  Administered 2019-11-04 – 2019-11-06 (×5): 2.5 mg via RESPIRATORY_TRACT
  Filled 2019-11-04 (×6): qty 3

## 2019-11-04 MED ORDER — MIDAZOLAM HCL 2 MG/2ML IJ SOLN
INTRAMUSCULAR | Status: AC
  Filled 2019-11-04: qty 2

## 2019-11-04 MED ORDER — ACETAMINOPHEN 10 MG/ML IV SOLN
INTRAVENOUS | Status: AC
  Filled 2019-11-04: qty 100

## 2019-11-04 MED ORDER — PROPOFOL 10 MG/ML IV BOLUS
INTRAVENOUS | Status: DC | PRN
  Administered 2019-11-04: 130 mg via INTRAVENOUS
  Administered 2019-11-04: 70 mg via INTRAVENOUS

## 2019-11-04 MED ORDER — LIDOCAINE HCL (CARDIAC) PF 100 MG/5ML IV SOSY
PREFILLED_SYRINGE | INTRAVENOUS | Status: DC | PRN
  Administered 2019-11-04: 50 mg via INTRAVENOUS

## 2019-11-04 MED ORDER — DEXAMETHASONE SODIUM PHOSPHATE 10 MG/ML IJ SOLN
INTRAMUSCULAR | Status: DC | PRN
  Administered 2019-11-04: 10 mg via INTRAVENOUS

## 2019-11-04 MED ORDER — MORPHINE SULFATE (PF) 2 MG/ML IV SOLN
1.0000 mg | INTRAVENOUS | Status: DC | PRN
  Administered 2019-11-04: 2 mg via INTRAVENOUS
  Administered 2019-11-04: 1 mg via INTRAVENOUS
  Administered 2019-11-04 – 2019-11-10 (×15): 2 mg via INTRAVENOUS
  Filled 2019-11-04 (×17): qty 1

## 2019-11-04 MED ORDER — METHOCARBAMOL 500 MG PO TABS
500.0000 mg | ORAL_TABLET | Freq: Four times a day (QID) | ORAL | Status: DC
  Administered 2019-11-04 – 2019-11-11 (×26): 500 mg via ORAL
  Filled 2019-11-04 (×27): qty 1

## 2019-11-04 MED ORDER — MEPERIDINE HCL 50 MG/ML IJ SOLN: 6.2500 mg | INTRAMUSCULAR | Status: DC | PRN

## 2019-11-04 MED ORDER — GLYCOPYRROLATE 0.2 MG/ML IJ SOLN
INTRAMUSCULAR | Status: DC | PRN
  Administered 2019-11-04: .2 mg via INTRAVENOUS

## 2019-11-04 MED ORDER — CEFAZOLIN SODIUM-DEXTROSE 1-4 GM/50ML-% IV SOLN
1.0000 g | Freq: Three times a day (TID) | INTRAVENOUS | Status: AC
  Administered 2019-11-04 – 2019-11-05 (×2): 1 g via INTRAVENOUS
  Filled 2019-11-04 (×2): qty 50

## 2019-11-04 MED ORDER — ROCURONIUM BROMIDE 10 MG/ML (PF) SYRINGE
PREFILLED_SYRINGE | INTRAVENOUS | Status: AC
  Filled 2019-11-04: qty 10

## 2019-11-04 MED ORDER — ONDANSETRON HCL 4 MG/2ML IJ SOLN
INTRAMUSCULAR | Status: AC
  Filled 2019-11-04: qty 2

## 2019-11-04 MED ORDER — ROCURONIUM BROMIDE 100 MG/10ML IV SOLN
INTRAVENOUS | Status: DC | PRN
  Administered 2019-11-04 (×3): 10 mg via INTRAVENOUS
  Administered 2019-11-04: 40 mg via INTRAVENOUS
  Administered 2019-11-04: 10 mg via INTRAVENOUS

## 2019-11-04 MED ORDER — FENTANYL CITRATE (PF) 100 MCG/2ML IJ SOLN
25.0000 ug | INTRAMUSCULAR | Status: AC | PRN
  Administered 2019-11-04 (×4): 25 ug via INTRAVENOUS

## 2019-11-04 MED ORDER — KCL IN DEXTROSE-NACL 20-5-0.45 MEQ/L-%-% IV SOLN
INTRAVENOUS | Status: DC
  Filled 2019-11-04 (×3): qty 1000

## 2019-11-04 MED ORDER — PROPOFOL 10 MG/ML IV BOLUS
INTRAVENOUS | Status: AC
  Filled 2019-11-04: qty 20

## 2019-11-04 MED ORDER — DEXAMETHASONE SODIUM PHOSPHATE 10 MG/ML IJ SOLN
INTRAMUSCULAR | Status: AC
  Filled 2019-11-04: qty 1

## 2019-11-04 MED ORDER — SODIUM CHLORIDE FLUSH 0.9 % IV SOLN
INTRAVENOUS | Status: AC
  Filled 2019-11-04: qty 50

## 2019-11-04 MED ORDER — DEXMEDETOMIDINE (PRECEDEX) IN NS 20 MCG/5ML (4 MCG/ML) IV SYRINGE
PREFILLED_SYRINGE | INTRAVENOUS | Status: DC | PRN
  Administered 2019-11-04: 8 ug via INTRAVENOUS
  Administered 2019-11-04: 12 ug via INTRAVENOUS

## 2019-11-04 MED ORDER — FENTANYL CITRATE (PF) 100 MCG/2ML IJ SOLN
INTRAMUSCULAR | Status: AC
  Filled 2019-11-04: qty 2

## 2019-11-04 MED ORDER — FENTANYL CITRATE (PF) 100 MCG/2ML IJ SOLN
INTRAMUSCULAR | Status: AC
  Administered 2019-11-04: 25 ug via INTRAVENOUS
  Filled 2019-11-04: qty 2

## 2019-11-04 MED ORDER — ONDANSETRON HCL 4 MG/2ML IJ SOLN
INTRAMUSCULAR | Status: DC | PRN
  Administered 2019-11-04: 4 mg via INTRAVENOUS

## 2019-11-04 MED ORDER — HYDROMORPHONE HCL 1 MG/ML IJ SOLN
1.0000 mg | Freq: Once | INTRAMUSCULAR | Status: AC
  Administered 2019-11-04: 1 mg via INTRAVENOUS
  Filled 2019-11-04: qty 1

## 2019-11-04 MED ORDER — BUPIVACAINE LIPOSOME 1.3 % IJ SUSP
INTRAMUSCULAR | Status: AC
  Filled 2019-11-04: qty 20

## 2019-11-04 MED ORDER — SODIUM CHLORIDE 0.9 % IV SOLN
INTRAVENOUS | Status: DC | PRN
  Administered 2019-11-04: 50 mL

## 2019-11-04 MED ORDER — FLUTICASONE-UMECLIDIN-VILANT 100-62.5-25 MCG/INH IN AEPB: 1.0000 | INHALATION_SPRAY | Freq: Every day | RESPIRATORY_TRACT | Status: DC

## 2019-11-04 MED ORDER — CEFAZOLIN SODIUM-DEXTROSE 2-4 GM/100ML-% IV SOLN
INTRAVENOUS | Status: AC
  Filled 2019-11-04: qty 100

## 2019-11-04 MED ORDER — KETOROLAC TROMETHAMINE 15 MG/ML IJ SOLN
15.0000 mg | Freq: Once | INTRAMUSCULAR | Status: AC
  Administered 2019-11-04: 15 mg via INTRAVENOUS
  Filled 2019-11-04: qty 1

## 2019-11-04 MED ORDER — METOPROLOL TARTRATE 25 MG PO TABS
25.0000 mg | ORAL_TABLET | Freq: Two times a day (BID) | ORAL | Status: DC
  Administered 2019-11-04 – 2019-11-11 (×13): 25 mg via ORAL
  Filled 2019-11-04 (×13): qty 1

## 2019-11-04 MED ORDER — MIDAZOLAM HCL 2 MG/2ML IJ SOLN
INTRAMUSCULAR | Status: DC | PRN
  Administered 2019-11-04: 2 mg via INTRAVENOUS

## 2019-11-04 MED ORDER — ALBUTEROL SULFATE (2.5 MG/3ML) 0.083% IN NEBU: 2.5000 mg | INHALATION_SOLUTION | RESPIRATORY_TRACT | Status: DC

## 2019-11-04 MED ORDER — ONDANSETRON HCL 4 MG/2ML IJ SOLN: 4.0000 mg | Freq: Four times a day (QID) | INTRAMUSCULAR | Status: DC | PRN

## 2019-11-04 MED ORDER — CHLORHEXIDINE GLUCONATE CLOTH 2 % EX PADS: 6.0000 | MEDICATED_PAD | Freq: Once | CUTANEOUS | Status: DC

## 2019-11-04 MED ORDER — EPHEDRINE 5 MG/ML INJ
INTRAVENOUS | Status: AC
  Filled 2019-11-04: qty 10

## 2019-11-04 MED ORDER — ACETAMINOPHEN 10 MG/ML IV SOLN
INTRAVENOUS | Status: DC | PRN
  Administered 2019-11-04: 1000 mg via INTRAVENOUS

## 2019-11-04 MED ORDER — PHENYLEPHRINE HCL (PRESSORS) 10 MG/ML IV SOLN
INTRAVENOUS | Status: DC | PRN
  Administered 2019-11-04: 100 ug via INTRAVENOUS

## 2019-11-04 MED ORDER — MOMETASONE FURO-FORMOTEROL FUM 100-5 MCG/ACT IN AERO
2.0000 | INHALATION_SPRAY | Freq: Two times a day (BID) | RESPIRATORY_TRACT | Status: DC
  Administered 2019-11-04 – 2019-11-11 (×12): 2 via RESPIRATORY_TRACT
  Filled 2019-11-04: qty 8.8

## 2019-11-04 MED ORDER — UMECLIDINIUM BROMIDE 62.5 MCG/INH IN AEPB
1.0000 | INHALATION_SPRAY | Freq: Every day | RESPIRATORY_TRACT | Status: DC
  Administered 2019-11-04 – 2019-11-11 (×8): 1 via RESPIRATORY_TRACT
  Filled 2019-11-04: qty 7

## 2019-11-04 MED ORDER — OXYCODONE HCL 5 MG PO TABS: 5.0000 mg | ORAL_TABLET | Freq: Once | ORAL | Status: DC | PRN

## 2019-11-04 MED ORDER — BUPIVACAINE HCL (PF) 0.25 % IJ SOLN
INTRAMUSCULAR | Status: AC
  Filled 2019-11-04: qty 30

## 2019-11-04 MED ORDER — OXYCODONE HCL 5 MG PO TABS
5.0000 mg | ORAL_TABLET | ORAL | Status: DC | PRN
  Administered 2019-11-04 – 2019-11-11 (×21): 10 mg via ORAL
  Filled 2019-11-04 (×24): qty 2

## 2019-11-04 MED ORDER — SUGAMMADEX SODIUM 200 MG/2ML IV SOLN
INTRAVENOUS | Status: DC | PRN
  Administered 2019-11-04: 200 mg via INTRAVENOUS

## 2019-11-04 MED ORDER — GLYCOPYRROLATE 0.2 MG/ML IJ SOLN
INTRAMUSCULAR | Status: AC
  Filled 2019-11-04: qty 1

## 2019-11-04 MED ORDER — EPHEDRINE SULFATE 50 MG/ML IJ SOLN
INTRAMUSCULAR | Status: DC | PRN
  Administered 2019-11-04 (×2): 10 mg via INTRAVENOUS

## 2019-11-04 MED ORDER — SPIRONOLACTONE 25 MG PO TABS
25.0000 mg | ORAL_TABLET | Freq: Every day | ORAL | Status: DC
  Administered 2019-11-04 – 2019-11-05 (×2): 25 mg via ORAL
  Filled 2019-11-04: qty 1

## 2019-11-04 MED ORDER — FENTANYL CITRATE (PF) 100 MCG/2ML IJ SOLN
INTRAMUSCULAR | Status: DC | PRN
  Administered 2019-11-04 (×4): 50 ug via INTRAVENOUS

## 2019-11-04 MED ORDER — PROMETHAZINE HCL 25 MG/ML IJ SOLN: 6.2500 mg | INTRAMUSCULAR | Status: DC | PRN

## 2019-11-04 MED ORDER — DEXMEDETOMIDINE (PRECEDEX) IN NS 20 MCG/5ML (4 MCG/ML) IV SYRINGE
PREFILLED_SYRINGE | INTRAVENOUS | Status: AC
  Filled 2019-11-04: qty 5

## 2019-11-04 MED ORDER — LIDOCAINE HCL (PF) 2 % IJ SOLN
INTRAMUSCULAR | Status: AC
  Filled 2019-11-04: qty 5

## 2019-11-04 MED ORDER — FAMOTIDINE 20 MG PO TABS
ORAL_TABLET | ORAL | Status: AC
  Administered 2019-11-04: 20 mg via ORAL
  Filled 2019-11-04: qty 1

## 2019-11-04 SURGICAL SUPPLY — 90 items
APL PRP STRL LF DISP 70% ISPRP (MISCELLANEOUS) ×2
BLADE SURG 15 STRL LF DISP TIS (BLADE) IMPLANT
BLADE SURG 15 STRL SS (BLADE) ×2
BRONCHOSCOPE BFLEX 3.8 (MISCELLANEOUS) ×2 IMPLANT
BULB RESERV EVAC DRAIN JP 100C (MISCELLANEOUS) ×1 IMPLANT
CANISTER SUCT 1200ML W/VALVE (MISCELLANEOUS) ×2 IMPLANT
CATH  RT ANGL  28F  SOF (CATHETERS) ×1
CATH RT ANGL 28F SOF (CATHETERS) IMPLANT
CATH THOR STR 28F  SOFT WA (CATHETERS) ×1
CATH THOR STR 28F SOFT WA (CATHETERS) IMPLANT
CATH URET ROBINSON 16FR STRL (CATHETERS) ×2 IMPLANT
CHLORAPREP W/TINT 26 (MISCELLANEOUS) ×4 IMPLANT
CNTNR SPEC 2.5X3XGRAD LEK (MISCELLANEOUS) ×4
CONN REDUCER 1/4X3/8 STR (CONNECTOR)
CONN REDUCER 3/8X3/8X3/8Y (CONNECTOR) ×2
CONNECTOR REDUCER 1/4X3/8 STR (CONNECTOR) IMPLANT
CONNECTOR REDUCER 3/8X3/8 (MISCELLANEOUS) ×2 IMPLANT
CONNECTOR REDUCER 3/8X3/8X3/8Y (CONNECTOR) IMPLANT
CONT SPEC 4OZ STER OR WHT (MISCELLANEOUS) ×4
CONT SPEC 4OZ STRL OR WHT (MISCELLANEOUS) ×4
CONTAINER SPEC 2.5X3XGRAD LEK (MISCELLANEOUS) ×4 IMPLANT
CUTTER ECHEON FLEX ENDO 45 340 (ENDOMECHANICALS) ×1 IMPLANT
DRAIN CHANNEL 19F RND (DRAIN) ×1 IMPLANT
DRAIN CHEST DRY SUCT SGL (MISCELLANEOUS) ×2 IMPLANT
DRAPE C-SECTION (MISCELLANEOUS) ×2 IMPLANT
DRAPE MAG INST 16X20 L/F (DRAPES) ×2 IMPLANT
DRSG OPSITE POSTOP 4X6 (GAUZE/BANDAGES/DRESSINGS) ×2 IMPLANT
DRSG OPSITE POSTOP 4X8 (GAUZE/BANDAGES/DRESSINGS) ×2 IMPLANT
DRSG TELFA 3X8 NADH (GAUZE/BANDAGES/DRESSINGS) ×2 IMPLANT
ELECT BLADE 6.5 EXT (BLADE) ×2 IMPLANT
ELECT CAUTERY BLADE TIP 2.5 (TIP) ×2
ELECT REM PT RETURN 9FT ADLT (ELECTROSURGICAL) ×2
ELECTRODE CAUTERY BLDE TIP 2.5 (TIP) ×1 IMPLANT
ELECTRODE REM PT RTRN 9FT ADLT (ELECTROSURGICAL) ×1 IMPLANT
GAUZE 4X4 16PLY RFD (DISPOSABLE) ×1 IMPLANT
GAUZE SPONGE 4X4 12PLY STRL (GAUZE/BANDAGES/DRESSINGS) ×2 IMPLANT
GLOVE SURG SYN 7.5  E (GLOVE) ×2
GLOVE SURG SYN 7.5 E (GLOVE) ×2 IMPLANT
GLOVE SURG SYN 7.5 PF PI (GLOVE) ×2 IMPLANT
GOWN STRL REUS W/ TWL LRG LVL3 (GOWN DISPOSABLE) ×4 IMPLANT
GOWN STRL REUS W/TWL LRG LVL3 (GOWN DISPOSABLE) ×8
KIT TURNOVER KIT A (KITS) ×2 IMPLANT
LABEL OR SOLS (LABEL) ×2 IMPLANT
LOOP RED MAXI  1X406MM (MISCELLANEOUS) ×1
LOOP VESSEL MAXI 1X406 RED (MISCELLANEOUS) ×1 IMPLANT
MARKER SKIN DUAL TIP RULER LAB (MISCELLANEOUS) ×2 IMPLANT
NDL SPNL 22GX3.5 QUINCKE BK (NEEDLE) ×1 IMPLANT
NEEDLE SPNL 22GX3.5 QUINCKE BK (NEEDLE) ×2 IMPLANT
PACK BASIN MAJOR ARMC (MISCELLANEOUS) ×2 IMPLANT
PAD DRESSING TELFA 3X8 NADH (GAUZE/BANDAGES/DRESSINGS) IMPLANT
RELOAD PROXIMATE TA60MM GREEN (ENDOMECHANICALS) ×2 IMPLANT
RELOAD STAPLE 35X2.5 WHT THIN (STAPLE) ×4 IMPLANT
RELOAD STAPLE 45 GOLD REG/THCK (STAPLE) IMPLANT
RELOAD STAPLE 60 4.7 GRN THCK (ENDOMECHANICALS) IMPLANT
RELOAD STAPLER LINE PROX 30 GR (STAPLE) ×1 IMPLANT
SPONGE KITTNER 5P (MISCELLANEOUS) ×2 IMPLANT
STAPLE RELOAD 2.5MM WHITE (STAPLE) ×6 IMPLANT
STAPLE RELOAD 45MM GOLD (STAPLE) ×6 IMPLANT
STAPLER RELOAD LINE PROX 30 GR (STAPLE) ×2
STAPLER RELOADABLE 30 GRN THCK (STAPLE) ×1 IMPLANT
STAPLER SKIN PROX 35W (STAPLE) ×2 IMPLANT
STAPLER VASCULAR ECHELON 35 (CUTTER) IMPLANT
STRIP CLOSURE SKIN 1/2X4 (GAUZE/BANDAGES/DRESSINGS) ×2 IMPLANT
SUT ETHILON 3-0 FS-10 30 BLK (SUTURE) ×2
SUT MNCRL 4-0 (SUTURE)
SUT MNCRL 4-0 27XMFL (SUTURE)
SUT PROLENE 3 0 SH DA (SUTURE) ×1 IMPLANT
SUT PROLENE 5 0 RB 1 DA (SUTURE) IMPLANT
SUT SILK 0 (SUTURE) ×2
SUT SILK 0 30XBRD TIE 6 (SUTURE) ×1 IMPLANT
SUT SILK 1 SH (SUTURE) ×14 IMPLANT
SUT VIC AB 0 CT1 36 (SUTURE) ×4 IMPLANT
SUT VIC AB 2-0 CT1 27 (SUTURE) ×4
SUT VIC AB 2-0 CT1 TAPERPNT 27 (SUTURE) ×2 IMPLANT
SUT VIC AB 2-0 SH 27 (SUTURE) ×2
SUT VIC AB 2-0 SH 27XBRD (SUTURE) IMPLANT
SUT VICRYL 2 TP 1 (SUTURE) ×6 IMPLANT
SUTURE EHLN 3-0 FS-10 30 BLK (SUTURE) IMPLANT
SUTURE MNCRL 4-0 27XMF (SUTURE) IMPLANT
SYR 10ML SLIP (SYRINGE) IMPLANT
SYR 20ML LL LF (SYRINGE) ×4 IMPLANT
SYR BULB IRRIG 60ML STRL (SYRINGE) ×2 IMPLANT
TAPE CLOTH 3X10 WHT NS LF (GAUZE/BANDAGES/DRESSINGS) ×2 IMPLANT
TAPE TRANSPORE STRL 2 31045 (GAUZE/BANDAGES/DRESSINGS) IMPLANT
TRAY FOLEY MTR SLVR 16FR STAT (SET/KITS/TRAYS/PACK) ×2 IMPLANT
TROCAR FLEXIPATH 20X80 (ENDOMECHANICALS) IMPLANT
TROCAR FLEXIPATH THORACIC 15MM (ENDOMECHANICALS) IMPLANT
TUBING CONNECTING 10 (TUBING) ×2 IMPLANT
WATER STERILE IRR 1000ML POUR (IV SOLUTION) ×2 IMPLANT
YANKAUER SUCT BULB TIP FLEX NO (MISCELLANEOUS) ×2 IMPLANT

## 2019-11-04 NOTE — Transfer of Care (Signed)
Immediate Anesthesia Transfer of Care Note  Patient: Ann [name]  Procedure(s) Performed: PRE-OPERATIVE BRONCHOSCOPY (Right ) THORACOTOMY/LOBECTOMY (Right )  Patient Location: PACU  Anesthesia Type:General  Level of Consciousness: sedated  Airway & Oxygen Therapy: Patient Spontanous Breathing and Patient connected to face mask oxygen  Post-op Assessment: Report given to RN and Post -op Vital signs reviewed and stable  Post vital signs: Reviewed  Last Vitals:  Vitals Value Taken Time  BP    Temp    Pulse 70 11/04/19 1200  Resp    SpO2 100 % 11/04/19 1200  Vitals shown include unvalidated device data.  Last Pain:  Vitals:   11/04/19 0617  TempSrc: Oral  PainSc: 0-No pain         Complications: No complications documented.

## 2019-11-04 NOTE — Anesthesia Postprocedure Evaluation (Signed)
Anesthesia Post Note  Patient: Ann [name]  Procedure(s) Performed: PRE-OPERATIVE BRONCHOSCOPY (Right ) THORACOTOMY/LOBECTOMY (Right )  Patient location during evaluation: PACU Anesthesia Type: General Level of consciousness: awake and alert and oriented Pain management: pain level controlled Vital Signs Assessment: post-procedure vital signs reviewed and stable Respiratory status: spontaneous breathing, nonlabored ventilation and respiratory function stable Cardiovascular status: blood pressure returned to baseline and stable Postop Assessment: no signs of nausea or vomiting Anesthetic complications: no   No complications documented.   Last Vitals:  Vitals:   11/04/19 1331 11/04/19 1400  BP: (!) 159/91 (!) 178/100  Pulse: 65 73  Resp: 13 (!) 23  Temp: 36.4 C   SpO2: 99% 99%    Last Pain:  Vitals:   11/04/19 1400  TempSrc:   PainSc: 1                  Chauntay Paszkiewicz

## 2019-11-04 NOTE — Anesthesia Procedure Notes (Addendum)
Procedure Name: Intubation Performed by: Rolla Plate, CRNA Pre-anesthesia Checklist: Patient identified, Patient being monitored, Timeout performed, Emergency Drugs available and Suction available Patient Re-evaluated:Patient Re-evaluated prior to induction Oxygen Delivery Method: Circle system utilized Preoxygenation: Pre-oxygenation with 100% oxygen Induction Type: IV induction Ventilation: Mask ventilation without difficulty and Mask ventilation throughout procedure Laryngoscope Size: 3 and McGraph Grade View: Grade I Tube type: Oral Endobronchial tube: Left and Double lumen EBT and 37 Fr Number of attempts: 2 Airway Equipment and Method: Stylet and Video-laryngoscopy Placement Confirmation: ETT inserted through vocal cords under direct vision,  positive ETCO2 and breath sounds checked- equal and bilateral Secured at: 27 cm Tube secured with: Tape Dental Injury: Teeth and Oropharynx as per pre-operative assessment  Difficulty Due To: Difficulty was anticipated, Difficult Airway- due to dentition and Difficult Airway- due to limited oral opening

## 2019-11-04 NOTE — Interval H&P Note (Signed)
History and Physical Interval Note:  11/04/2019 7:22 AM  Ann [name]  has presented today for surgery, with the diagnosis of Right lung mass.  The various methods of treatment have been discussed with the patient and family. After consideration of risks, benefits and other options for treatment, the patient has consented to  Procedure(s): PRE-OPERATIVE BRONCHOSCOPY (Right) THORACOTOMY/LOBECTOMY (Right) as a surgical intervention.  The patient's history has been reviewed, patient examined, no change in status, stable for surgery.  I have reviewed the patient's chart and labs.  Questions were answered to the patient's satisfaction.     Nestor Lewandowsky

## 2019-11-04 NOTE — Anesthesia Preprocedure Evaluation (Signed)
Anesthesia Evaluation  Patient identified by MRN, date of birth, ID band Patient awake    Reviewed: Allergy & Precautions, NPO status , Patient's Chart, lab work & pertinent test results  History of Anesthesia Complications Negative for: history of anesthetic complications  Airway Mallampati: III  TM Distance: >3 FB Neck ROM: Full    Dental  (+) Poor Dentition   Pulmonary neg sleep apnea, COPD,  COPD inhaler,  Lung mass   breath sounds clear to auscultation- rhonchi (-) wheezing      Cardiovascular hypertension, Pt. on medications + CAD, + Past MI and + Cardiac Stents (2005)  (-) CABG  Rhythm:Regular Rate:Normal - Systolic murmurs and - Diastolic murmurs    Neuro/Psych neg Seizures CVA, No Residual Symptoms negative psych ROS   GI/Hepatic negative GI ROS, Neg liver ROS,   Endo/Other  negative endocrine ROSneg diabetes  Renal/GU negative Renal ROS     Musculoskeletal negative musculoskeletal ROS (+)   Abdominal (+) - obese,   Peds  Hematology negative hematology ROS (+)   Anesthesia Other Findings Past Medical History: No date: COPD (chronic obstructive pulmonary disease) (HCC) No date: Coronary artery disease     Comment:  Prior stenting Hypoxic: Hypertension No date: Hypokalemia No date: Multiple thyroid nodules No date: Myocardial infarction ([place]) 2005: Stroke ([place])     Comment:  TIA No date: Syncope   Reproductive/Obstetrics                             Anesthesia Physical Anesthesia Plan  ASA: III  Anesthesia Plan: General   Post-op Pain Management:    Induction: Intravenous  PONV Risk Score and Plan: 2 and Ondansetron and Dexamethasone  Airway Management Planned: Oral ETT, Double Lumen EBT and Video Laryngoscope Planned  Additional Equipment:   Intra-op Plan:   Post-operative Plan: Extubation in OR  Informed Consent: I have reviewed the patients History and  Physical, chart, labs and discussed the procedure including the risks, benefits and alternatives for the proposed anesthesia with the patient or authorized representative who has indicated his/her understanding and acceptance.     Dental advisory given  Plan Discussed with: CRNA and Anesthesiologist  Anesthesia Plan Comments:         Anesthesia Quick Evaluation

## 2019-11-05 ENCOUNTER — Encounter: Payer: Self-pay | Admitting: Cardiothoracic Surgery

## 2019-11-05 DIAGNOSIS — R918 Other nonspecific abnormal finding of lung field: Secondary | ICD-10-CM

## 2019-11-05 DIAGNOSIS — I639 Cerebral infarction, unspecified: Secondary | ICD-10-CM | POA: Diagnosis present

## 2019-11-05 DIAGNOSIS — E785 Hyperlipidemia, unspecified: Secondary | ICD-10-CM

## 2019-11-05 DIAGNOSIS — J449 Chronic obstructive pulmonary disease, unspecified: Secondary | ICD-10-CM | POA: Diagnosis present

## 2019-11-05 DIAGNOSIS — E876 Hypokalemia: Secondary | ICD-10-CM

## 2019-11-05 DIAGNOSIS — D72829 Elevated white blood cell count, unspecified: Secondary | ICD-10-CM | POA: Diagnosis present

## 2019-11-05 DIAGNOSIS — I2510 Atherosclerotic heart disease of native coronary artery without angina pectoris: Secondary | ICD-10-CM

## 2019-11-05 DIAGNOSIS — I10 Essential (primary) hypertension: Secondary | ICD-10-CM

## 2019-11-05 LAB — MAGNESIUM: Magnesium: 1.7 mg/dL (ref 1.7–2.4)

## 2019-11-05 LAB — BRAIN NATRIURETIC PEPTIDE: B Natriuretic Peptide: 225.2 pg/mL — ABNORMAL HIGH (ref 0.0–100.0)

## 2019-11-05 MED ORDER — CHLORHEXIDINE GLUCONATE CLOTH 2 % EX PADS
6.0000 | MEDICATED_PAD | Freq: Every day | CUTANEOUS | Status: DC
  Administered 2019-11-05 – 2019-11-07 (×3): 6 via TOPICAL

## 2019-11-05 MED ORDER — ACETAMINOPHEN 325 MG PO TABS: 650.0000 mg | ORAL_TABLET | Freq: Four times a day (QID) | ORAL | Status: DC | PRN

## 2019-11-05 MED ORDER — POTASSIUM CHLORIDE CRYS ER 20 MEQ PO TBCR: 40.0000 meq | EXTENDED_RELEASE_TABLET | Freq: Once | ORAL | Status: DC

## 2019-11-05 MED ORDER — AMLODIPINE BESYLATE 5 MG PO TABS
5.0000 mg | ORAL_TABLET | Freq: Every day | ORAL | Status: DC
  Administered 2019-11-05: 5 mg via ORAL
  Filled 2019-11-05: qty 1

## 2019-11-05 MED ORDER — POTASSIUM CHLORIDE CRYS ER 20 MEQ PO TBCR
20.0000 meq | EXTENDED_RELEASE_TABLET | Freq: Once | ORAL | Status: AC
  Administered 2019-11-05: 20 meq via ORAL
  Filled 2019-11-05: qty 1

## 2019-11-05 MED ORDER — HYDRALAZINE HCL 20 MG/ML IJ SOLN: 5.0000 mg | INTRAMUSCULAR | Status: DC | PRN

## 2019-11-05 NOTE — Op Note (Signed)
11/04/2019  10:13 AM  PATIENT:  Ann [name]  64 y.o. female  PRE-OPERATIVE DIAGNOSIS: Right upper lobe mass x2  POST-OPERATIVE DIAGNOSIS: Right upper lobe mass x2  PROCEDURE: Right thoracotomy with right upper lobectomy  SURGEON:  Surgeon(s) and Role:    * Nestor Lewandowsky, MD - Primary    * Olean Ree, MD  ASSISTANTS: Dr. Olean Ree  ANESTHESIA: General  INDICATIONS FOR PROCEDURE this patient is a 64 year old African-American female who was recently found to have a PET +1 cm right upper lobe spiculated mass that was most consistent with a malignancy.  In addition she had a 1 cm groundglass opacity just above the minor fissure within the right upper lobe.  They both appeared to be suspicious for lung cancer and she was offered the above name operation for definitive diagnosis and management.  The indications and risks of the surgical procedure were explained the patient in detail and she gave her informed consent.  DICTATION: The patient was brought to the operating suite and placed in the supine position.  General endotracheal anesthesia was given with a double-lumen tube.  Preoperative bronchoscopy was carried out.  The right upper lobe orifice consisted of 2 large branches.  There is no evidence of endobronchial tumor.  The tube was secured in proper position and the patient was then turned for a right thoracotomy.  A muscle-sparing thoracotomy was performed.  The latissimus and serratus muscles were retracted and the chest was entered through approximately the fifth intercostal space.  Once the chest was entered the inferior pulmonary ligament was divided.  We then felt the upper lobe and could easily feel the lesion in the superior portion of the right upper lobe.  The second lesion was more difficult to feel but there is a vague fullness measuring about 1 cm just above the fissure in the upper lobe.  For this reason we like to proceed on with a right upper lobectomy.  The  mediastinal pleura was divided.  The superior pulmonary vein branches to the upper lobe were identified.  The arterial supply to the upper lobe was also identified.  This consisted of a large apical anterior branch and a smaller branch presumably going to the posterior segment which also rose just distal to the apical anterior branch.  All the structures were taken with the vascular stapler.  The fissure between the upper and lower lobes was complete with electrocautery.  The fissure between the upper middle lobe was completed with the stapling device.  The only remaining structure was the bronchus.  Lymph nodes along the bronchus were swept up into the specimen.  The bronchus was then secured with a TX stapling device.  The middle and lower lobe was ventilated freely after the upper lobe bronchus was secured.  The stapler was then fired and the specimen was removed.  We then checked the bronchial stump at 30 cm of water pressure and there was no evidence of an air leak.  2 chest tubes were inserted in standard fashion.  The chest was hemostatic and we then closed the chest with #2 Vicryl pericostal sutures.  A Blake drain was inserted in the subcutaneous tissues and Scarpa's fascia and the skin were then closed.  The access port was also closed with multiple layers running absorbable suture and nylon on the skin.  At the completion of the procedure the patient was rolled in the supine position where she was extubated and taken to the recovery room in stable condition.  All sponge needle and instrument counts were correct as reported to me at the end of the case.   Nestor Lewandowsky, MD

## 2019-11-05 NOTE — Consult Note (Signed)
Medical Consultation   Ann [name]  TDV:[id number]  DOB: 08-Oct-1955  DOA: 11/04/2019  PCP: Kirk Ruths, MD   Outpatient Specialists:    Requesting physician: Dr. Genevive Bi of [facility] surgeon  Reason for consultation: -difficult in controlling blood pressure     History of Present Illness: Ann [name] is an 64 y.o. female with a past medical history of hypertension, hyperlipidemia, COPD, stroke, CAD, stent placement, who is admitted due to right lung mass (suspecting malignancy) by thoracic surgeon. Pt underwent right thoracotomy and right upper lobectomy 11/04/19. We are asked to consult due to difficulty in controlling blood pressure.  Pt states that she has moderate pain in the surgical site of right side of chest, which is aggravated by deep breath and movement. She has mild dry cough and SOB due to COPD. No wheezing.  No fever or chills.  Patient denies nausea vomiting, diarrhea, abdominal pain, symptoms of UTI.  No unilateral weakness. Pt is on clear diet now. She is taking Lopressor 25 mg twice daily, 25 mg of spironolactone daily and as needed Lasix for edema at home.  Her systolic blood pressure is 170-180s currently.  Lab: WBC 12.0, potassium 3.3, renal function okay, negative Covid PCR 10/31/2019.  Temperature normal, heart rate 74, RR 30, 13, oxygen saturation 99% on 1L O2. CXR showed postsurgical changes of the RIGHT hemithorax.   Review of Systems:   General: no fevers, chills, no changes in body weight, no changes in appetite Skin: no rash HEENT: no blurry vision, hearing changes or sore throat Pulm: has dyspnea, coughing, no wheezing CV: has pain in surgical site of right side of chest, no palpitations Abd: no nausea/vomiting, abdominal pain, diarrhea/constipation GU: no dysuria, hematuria, polyuria Ext: no arthralgias, myalgias Neuro: no weakness, numbness, or tingling    Past Medical History: Past Medical History:  Diagnosis Date  . COPD  (chronic obstructive pulmonary disease) ([place])   . Coronary artery disease    Prior stenting  . Hypertension Hypoxic  . Hypokalemia   . Multiple thyroid nodules   . Myocardial infarction ([place])   . Stroke [facility]) 2005   TIA  . Syncope     Past Surgical History: Past Surgical History:  Procedure Laterality Date  . BACK SURGERY     DECOMRESSION L4-5 WITH RODS AND FUSION  . CAROTID STENT  07/18/2003  . NECK SURGERY    . THORACOTOMY/LOBECTOMY Right 11/04/2019   Procedure: THORACOTOMY/LOBECTOMY;  Surgeon: Nestor Lewandowsky, MD;  Location: [facility] ORS;  Service: Thoracic;  Laterality: Right;  . TONSILLECTOMY    . TUBAL LIGATION       Allergies:   Allergies  Allergen Reactions  . Clopidogrel Anaphylaxis and Hives     Social History:  reports that she quit smoking about 2 weeks ago. Her smoking use included cigarettes. She has a 12.25 pack-year smoking history. She has never used smokeless tobacco. She reports current alcohol use. She reports that she does not use drugs.   Family History: Family History  Problem Relation Age of Onset  . Alzheimer's disease Mother   . Heart attack Father   . Diabetes Mellitus II Brother   . Diabetes Mellitus II Brother     Physical Exam: Vitals:   11/05/19 0800 11/05/19 0900 11/05/19 1000 11/05/19 1115  BP: (!) 176/93 (!) 161/98 (!) 173/91   Pulse: 86 72 74   Resp: 20 19 13    Temp:  98.2 F (36.8 C)     TempSrc: Oral     SpO2: 100% 100% 100% 99%  Weight:      Height:       General: Not in acute distress HEENT: PERRL, EOMI, no scleral icterus, No JVD or bruit Cardiac: S1/S2, RRR, No murmurs, gallops or rubs Pulm: Has fine crackles bilaterally, no wheezing or rhonchi.   Abd: Soft, nondistended, nontender, no rebound pain, no organomegaly, BS present Ext: No edema. 1+DP/PT pulse bilaterally Musculoskeletal: No joint deformities, erythema, or stiffness, ROM full Skin: No rashes.  Neuro: Alert and oriented X3, cranial nerves II-XII grossly  intact. Psych: Patient is not psychotic, no suicidal or hemocidal ideation.  Data reviewed:  I have personally reviewed following labs and imaging studies Labs:  CBC: Recent Labs  Lab 10/31/19 1104 11/04/19 1406  WBC 6.2 12.0*  NEUTROABS 3.0  --   HGB 12.7 13.6  HCT 38.5 40.8  MCV 97.2 97.8  PLT 253 979    Basic Metabolic Panel: Recent Labs  Lab 10/31/19 1104 11/04/19 1406  NA 142 136  K 3.3* 3.3*  CL 104 101  CO2 28 25  GLUCOSE 111* 147*  BUN 19 10  CREATININE 0.64 0.65  CALCIUM 8.6* 8.5*   GFR Estimated Creatinine Clearance: 54.2 mL/min (by C-G formula based on SCr of 0.65 mg/dL). Liver Function Tests: Recent Labs  Lab 10/31/19 1104  AST 25  ALT 32  ALKPHOS 68  BILITOT 0.6  PROT 6.3*  ALBUMIN 3.6   No results for input(s): LIPASE, AMYLASE in the last 168 hours. No results for input(s): AMMONIA in the last 168 hours. Coagulation profile Recent Labs  Lab 10/31/19 1104  INR 0.9    Cardiac Enzymes: No results for input(s): CKTOTAL, CKMB, CKMBINDEX, TROPONINI in the last 168 hours. BNP: Invalid input(s): POCBNP CBG: Recent Labs  Lab 11/04/19 1348  GLUCAP 138*   D-Dimer No results for input(s): DDIMER in the last 72 hours. Hgb A1c No results for input(s): HGBA1C in the last 72 hours. Lipid Profile No results for input(s): CHOL, HDL, LDLCALC, TRIG, CHOLHDL, LDLDIRECT in the last 72 hours. Thyroid function studies No results for input(s): TSH, T4TOTAL, T3FREE, THYROIDAB in the last 72 hours.  Invalid input(s): FREET3 Anemia work up No results for input(s): VITAMINB12, FOLATE, FERRITIN, TIBC, IRON, RETICCTPCT in the last 72 hours. Urinalysis No results found for: COLORURINE, APPEARANCEUR, LABSPEC, [place], GLUCOSEU, [place], [place], [place], [place], [place], NITRITE, [place]   Microbiology Recent Results (from the past 240 hour(s))  Surgical pcr screen     Status: None   Collection Time: 10/31/19 11:01 AM   Specimen: Nasal  Mucosa; Nasal Swab  Result Value Ref Range Status   MRSA, PCR NEGATIVE NEGATIVE Final   Staphylococcus aureus NEGATIVE NEGATIVE Final    Comment: (NOTE) The Xpert SA Assay (FDA approved for NASAL specimens in patients 64 years of age and older), is one component of a comprehensive surveillance program. It is not intended to diagnose infection nor to guide or monitor treatment. Performed at [facility], [place], [place] [address]   SARS CORONAVIRUS 2 (TAT 6-24 HRS) Nasopharyngeal Nasopharyngeal Swab     Status: None   Collection Time: 10/31/19 12:50 PM   Specimen: Nasopharyngeal Swab  Result Value Ref Range Status   SARS Coronavirus 2 NEGATIVE NEGATIVE Final    Comment: (NOTE) SARS-CoV-2 target nucleic acids are NOT DETECTED.  The SARS-CoV-2 RNA is generally detectable in upper and lower respiratory specimens during  the acute phase of infection. Negative results do not preclude SARS-CoV-2 infection, do not rule out co-infections with other pathogens, and should not be used as the sole basis for treatment or other patient management decisions. Negative results must be combined with clinical observations, patient history, and epidemiological information. The expected result is Negative.  Fact Sheet for Patients: SugarRoll.be  Fact Sheet for Healthcare Providers: https://www.woods-mathews.com/  This test is not yet approved or cleared by the [place] FDA and  has been authorized for detection and/or diagnosis of SARS-CoV-2 by FDA under an Emergency Use Authorization (EUA). This EUA will remain  in effect (meaning this test can be used) for the duration of the COVID-19 declaration under Se ction 564(b)(1) of the Act, 21 U.S.C. section 360bbb-3(b)(1), unless the authorization is terminated or revoked sooner.  Performed at [facility], [place] [address], [place],  [address]        Inpatient  Medications:   Scheduled Meds: . albuterol  2.5 mg Nebulization Q4H WA  . amLODipine  5 mg Oral Daily  . bisacodyl  10 mg Oral Daily  . Chlorhexidine Gluconate Cloth  6 each Topical Daily  . methocarbamol  500 mg Oral QID  . metoprolol tartrate  25 mg Oral BID  . mometasone-formoterol  2 puff Inhalation BID  . umeclidinium bromide  1 puff Inhalation Daily   Continuous Infusions:    Radiological Exams on Admission: DG Chest Port 1 View  Result Date: 11/04/2019 CLINICAL DATA:  Postoperative check EXAM: PORTABLE CHEST 1 VIEW COMPARISON:  Portable exam 1220 hours compared to 10/31/2019 FINDINGS: RIGHT thoracostomy tubes new since prior exam. Loop recorder and coronary stent project over heart. Minimal enlargement of cardiac silhouette with slight vascular congestion. Atherosclerotic calcifications aorta. Lungs clear. Postsurgical changes RIGHT hemithorax. No pleural effusion or pneumothorax. IMPRESSION: Postsurgical changes of the RIGHT hemithorax. No acute abnormalities. Aortic Atherosclerosis (ICD10-I70.0). Electronically Signed   By: Lavonia Dana M.D.   On: 11/04/2019 12:47    Impression/Recommendations Principal Problem:   Lung mass Active Problems:   HTN, goal below 140/90   Coronary artery disease involving native coronary artery of native heart without angina pectoris   Hypokalemia   HLD (hyperlipidemia)   COPD (chronic obstructive pulmonary disease) (HCC)   Stroke (HCC)   Leukocytosis   Lung mass:  Per Dr. Gerome Apley note, Pt was recently found to have a PET +1 cm right upper lobe spiculated mass that was most consistent with a malignancy. Pt underwent right thoracotomy and right upper lobectomy 11/04/19. Pt has moderate pain in surgical site currently. -Pain management per primary thoracic surgeon team -they discussed the role of radiation therapy with pt.  HTN, goal below 140/90: -continue Lopressor 25 mg twice daily -Switch spironolactone to amlodipine 5 mg daily -IV  hydralazine as needed for SBP>165  Hx of Coronary artery disease involving native coronary artery of native heart without angina pectoris: s/p of CAD -hold ASA due to RIGHT hemithorax. -pt is not taking Crestor  Hypokalemia: K 3.3 -repleted K -check Mg level  HLD (hyperlipidemia) -pt is not taking Crestor -f/u with PCP  COPD (chronic obstructive pulmonary disease) ([place]): stable - prn albuterol nebs and bronchodilators  Stroke (HCC) -Hold ASA as above  Leukocytosis: WBC 12.0. No fever. No source of infection identified, likely reactive.. -Follow-up by CBC     Thank you for this consultation.  Our [facility] hospitalist team will follow the patient with you.   Time Spent: 30 min  Ivor Costa M.D. [facility] 11/05/2019, 11:55 AM

## 2019-11-05 NOTE — Progress Notes (Signed)
PT Cancellation Note  Patient Details Name: Ann [name] MRN: [id number] DOB: [birth year]   Cancelled Treatment:    Reason Eval/Treat Not Completed:  (Consult received and chart reviewed. Patient sleeping soundly and husband (at bedside) requests therapist allow her to rest, re-attempt at later date/time.  Will continue efforts next date as appropriate.)  [name], PT, DPT, NCS 11/05/19, 2:51 PM [phone]

## 2019-11-06 ENCOUNTER — Inpatient Hospital Stay: Payer: Medicare Other

## 2019-11-06 ENCOUNTER — Other Ambulatory Visit: Payer: Self-pay | Admitting: Pathology

## 2019-11-06 LAB — CBC
HCT: 39.4 % (ref 36.0–46.0)
Hemoglobin: 12.9 g/dL (ref 12.0–15.0)
MCH: 32.2 pg (ref 26.0–34.0)
MCHC: 32.7 g/dL (ref 30.0–36.0)
MCV: 98.3 fL (ref 80.0–100.0)
Platelets: 224 10*3/uL (ref 150–400)
RBC: 4.01 MIL/uL (ref 3.87–5.11)
RDW: 15.4 % (ref 11.5–15.5)
WBC: 9 10*3/uL (ref 4.0–10.5)
nRBC: 0 % (ref 0.0–0.2)

## 2019-11-06 LAB — BASIC METABOLIC PANEL
Anion gap: 8 (ref 5–15)
BUN: 6 mg/dL — ABNORMAL LOW (ref 8–23)
CO2: 25 mmol/L (ref 22–32)
Calcium: 8.6 mg/dL — ABNORMAL LOW (ref 8.9–10.3)
Chloride: 100 mmol/L (ref 98–111)
Creatinine, Ser: 0.55 mg/dL (ref 0.44–1.00)
GFR, Estimated: >60 mL/min (ref >60)
Glucose, Bld: 115 mg/dL — ABNORMAL HIGH (ref 70–99)
Potassium: 4.6 mmol/L (ref 3.5–5.1)
Sodium: 133 mmol/L — ABNORMAL LOW (ref 135–145)

## 2019-11-06 LAB — SURGICAL PATHOLOGY

## 2019-11-06 MED ORDER — ALBUTEROL SULFATE (2.5 MG/3ML) 0.083% IN NEBU: 2.5000 mg | INHALATION_SOLUTION | RESPIRATORY_TRACT | Status: DC | PRN

## 2019-11-06 MED ORDER — AMLODIPINE BESYLATE 10 MG PO TABS
10.0000 mg | ORAL_TABLET | Freq: Every day | ORAL | Status: DC
  Administered 2019-11-06 – 2019-11-11 (×6): 10 mg via ORAL
  Filled 2019-11-06 (×6): qty 1

## 2019-11-06 MED ORDER — IPRATROPIUM-ALBUTEROL 0.5-2.5 (3) MG/3ML IN SOLN
3.0000 mL | Freq: Three times a day (TID) | RESPIRATORY_TRACT | Status: DC
  Administered 2019-11-06: 3 mL via RESPIRATORY_TRACT
  Filled 2019-11-06: qty 3

## 2019-11-06 MED ORDER — IPRATROPIUM-ALBUTEROL 0.5-2.5 (3) MG/3ML IN SOLN: 3.0000 mL | Freq: Three times a day (TID) | RESPIRATORY_TRACT | Status: DC | PRN

## 2019-11-06 NOTE — Evaluation (Signed)
Physical Therapy Evaluation Patient Details Name: Ann [name] MRN: [id number] DOB: Jun 14, 1955 Today's Date: 11/06/2019   History of Present Illness  admitted for acute hospitalization s/p R thoracotomy and R UL lobectomy secondary to R lung mass (11/04/19)  Clinical Impression  Patient resting in bed upon arrival to room, husband at bedside.  Alert and oriented to all information; follows commands and agreeable to OOB activities.  Endorses pain in R lateral chest, back (FACES scale 6/10); meds received prior to session.  Bilat UE/LE strength and ROM grossly symmetrical and WFL; no focal weakness appreciated.  Able to complete bed mobility with close sup; sit/stand, basic transfers and gait (250') with RW, cga/close sup.  Demonstrates reciprocal stepping pattern with fair step height/length; fair cadence; min cuing for postural extension.  Does require 2L supplemental O2 to maintain sats >92% with exertional activities. Would benefit from skilled PT to address above deficits and promote optimal return to PLOF.; Recommend transition to HHPT upon discharge from acute hospitalization.  SaO2 on room air while ambulating = 86% SaO2 on 2 liters of O2 while ambulating = 92%   Follow Up Recommendations Home health PT    Equipment Recommendations  Rolling walker with 5" wheels    Recommendations for Other Services       Precautions / Restrictions Precautions Precautions: Fall Precaution Comments: R chest tube (okay for water seal with ambulation per order), R JP drain Restrictions Weight Bearing Restrictions: No      Mobility  Bed Mobility Overal bed mobility: Needs Assistance Bed Mobility: Supine to Sit     Supine to sit: Supervision          Transfers Overall transfer level: Needs assistance Equipment used: Rolling walker (2 wheeled) Transfers: Sit to/from Stand Sit to Stand: Min guard         General transfer comment: cuing for hand  placement  Ambulation/Gait Ambulation/Gait assistance: Min guard Gait Distance (Feet): 250 Feet Assistive device: Rolling walker (2 wheeled)       General Gait Details: reciprocal stepping pattern with fair step height/length; fair cadence; min cuing for postural extension.  Sats >92% on 2L supplemental O2 via .  Stairs            Wheelchair Mobility    Modified Rankin (Stroke Patients Only)       Balance Overall balance assessment: Needs assistance Sitting-balance support: No upper extremity supported;Feet supported Sitting balance-Leahy Scale: Good     Standing balance support: Bilateral upper extremity supported Standing balance-Leahy Scale: Fair                               Pertinent Vitals/Pain Pain Assessment: Faces Faces Pain Scale: Hurts even more Pain Location: R lateral chest, back Pain Descriptors / Indicators: Aching;Guarding;Grimacing Pain Intervention(s): Limited activity within patient's tolerance;Monitored during session;Repositioned    Home Living Family/patient expects to be discharged to:: Private residence Living Arrangements: Spouse/significant other Available Help at Discharge: Family Type of Home: House Home Access: Stairs to enter Entrance Stairs-Rails: None Entrance Stairs-Number of Steps: 3 Home Layout: One level Home Equipment: None Additional Comments: Does have ramp access in front of home if needed    Prior Function Level of Independence: Independent         Comments: Indep with ADLs, household and community mobilization without assist device; no home O2.  Does endorse history of syncopal events; resolved with cardiac intervention (July, 2021) per patient  Hand Dominance        Extremity/Trunk Assessment   Upper Extremity Assessment Upper Extremity Assessment: Overall WFL for tasks assessed (grossly at least 4/5 throughout)    Lower Extremity Assessment Lower Extremity Assessment: Overall WFL  for tasks assessed (grossly at least 4/5 throughout)       Communication   Communication: No difficulties  Cognition Arousal/Alertness: Awake/alert Behavior During Therapy: WFL for tasks assessed/performed Overall Cognitive Status: Within Functional Limits for tasks assessed                                        General Comments      Exercises Other Exercises Other Exercises: Sit/stand and bed/chair with RW, cga/min assist-cuing for hand placement, walker position/management Other Exercises: Reviewed use of incentive spirometer with patient returning demonstration x10 reps, sup/min assist for correct technique   Assessment/Plan    PT Assessment Patient needs continued PT services  PT Problem List Decreased activity tolerance;Decreased balance;Decreased mobility;Cardiopulmonary status limiting activity;Pain       PT Treatment Interventions DME instruction;Functional mobility training;Therapeutic activities;Balance training;Therapeutic exercise;Gait training;Patient/family education    PT Goals (Current goals can be found in the Care Plan section)  Acute Rehab PT Goals Patient Stated Goal: to do what I need to do to get out of here PT Goal Formulation: With patient/family Time For Goal Achievement: 11/20/19 Potential to Achieve Goals: Good    Frequency Min 2X/week   Barriers to discharge        Co-evaluation               AM-PAC PT "6 Clicks" Mobility  Outcome Measure Help needed turning from your back to your side while in a flat bed without using bedrails?: None Help needed moving from lying on your back to sitting on the side of a flat bed without using bedrails?: None Help needed moving to and from a bed to a chair (including a wheelchair)?: A Little Help needed standing up from a chair using your arms (e.g., wheelchair or bedside chair)?: A Little Help needed to walk in hospital room?: A Little Help needed climbing 3-5 steps with a railing?  : A Little 6 Click Score: 20    End of Session Equipment Utilized During Treatment: Gait belt;Oxygen Activity Tolerance: Patient tolerated treatment well Patient left: in chair;with call bell/phone within reach;with chair alarm set;with family/visitor present Nurse Communication: Mobility status PT Visit Diagnosis: Muscle weakness (generalized) (M62.81);Difficulty in walking, not elsewhere classified (R26.2)    Time: 9323-5573 PT Time Calculation (min) (ACUTE ONLY): 33 min   Charges:   PT Evaluation $PT Eval Moderate Complexity: 1 Mod PT Treatments $Therapeutic Activity: 8-22 mins       Benita Boonstra H. Owens Shark, PT, DPT, NCS 11/06/19, 11:09 AM [phone]

## 2019-11-06 NOTE — Progress Notes (Signed)
Patient had been weaned off O2 to room air this morning by respiratory. Received call from central tele that sats were sustained in the 70's at present time. Replaced O2 at 2l/m via [place].

## 2019-11-06 NOTE — Progress Notes (Signed)
Foley catheter removed at this time.

## 2019-11-06 NOTE — Progress Notes (Signed)
PROGRESS NOTE    Ann [name]  RKY:[id number] DOB: [birth year] DOA: 11/04/2019 PCP: Kirk Ruths, MD   Brief Narrative: Taken from prior consult note. Ann [name] is an 64 y.o. female with a past medical history of hypertension, hyperlipidemia, COPD, stroke, CAD, stent placement, who is admitted due to right lung mass (suspecting malignancy) by thoracic surgeon. Pt underwent right thoracotomy and right upper lobectomy 11/04/19. We are asked to consult due to difficulty in controlling blood pressure.  Subjective: Patient was experiencing 7 out of 10 pain at the time of morning rounds.  She just received her pain medications, according to her it was 10 out of 10-hour before.  Denies any other complaint.  No chest pain or shortness of breath.  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  Blood pressure was elevated in the morning.  Pain might be playing a role.  She was started on amlodipine yesterday and spironolactone was discontinued. -Increase amlodipine to 10 mg daily. -Continue Lopressor at 25 mg twice daily. -Pain management as it should improve her blood pressure. -IV hydralazine as needed for systolic above 315.  Lung mass:  Per Dr. Gerome Apley note, Pt was recently found to have a PET +1 cm right upper lobe spiculated mass that was most consistent with a malignancy. Pt underwent right thoracotomy and right upper lobectomy 11/04/19. Pt has moderate pain in surgical site currently. -Pain management per primary thoracic surgeon team -they discussed the role of radiation therapy with pt.  Hx of Coronary artery disease involving native coronary artery of native heart without angina pectoris: s/p  of CAD -hold ASA due to RIGHT hemithorax. -pt is not taking Crestor  Hypokalemia: K 3.3 -repleted K -check Mg level  HLD (hyperlipidemia) -pt is not taking Crestor -f/u with PCP  COPD (chronic obstructive pulmonary disease) ([place]): stable - prn albuterol nebs and bronchodilators  Stroke (HCC) -Hold ASA as above  Leukocytosis: WBC 12.0. No fever. No source of infection identified, likely reactive.. -Follow-up by CBC  Objective: Vitals:   11/06/19 1219 11/06/19 1219 11/06/19 1410 11/06/19 1626  BP:  118/74  136/79  Pulse:  68 77 71  Resp:  18 15 18   Temp: 97.7 F (36.5 C)   98.3 F (36.8 C)  TempSrc: Oral   Oral  SpO2:  100% 96% 98%  Weight:      Height:        Intake/Output Summary (Last 24 hours) at 11/06/2019 1746 Last data filed at 11/06/2019 1452 Gross per 24 hour  Intake 0 ml  Output 1065 ml  Net -1065 ml   Filed Weights   11/04/19 0617  Weight: 49 kg    Examination:  General exam: Frail lady, appears uncomfortable secondary to pain. Respiratory system: Clear to auscultation. Respiratory effort normal. Cardiovascular system: S1 & S2 heard, RRR.  Gastrointestinal system: Soft, nontender, nondistended, bowel sounds positive. Central nervous system: Alert and oriented. No focal neurological deficits. Extremities: No edema, no cyanosis, pulses intact and symmetrical. Psychiatry: Judgement and insight appear normal.    Status is: Inpatient  We are consultant on this patient.  Thoracic surgery is primary.   Procedures:  Antimicrobials:   Data Reviewed: I have personally  reviewed following labs and imaging studies  CBC: Recent Labs  Lab 10/31/19 1104 11/04/19 1406 11/06/19 0423  WBC 6.2 12.0* 9.0  NEUTROABS 3.0  --   --   HGB 12.7 13.6 12.9  HCT 38.5 40.8 39.4  MCV 97.2 97.8 98.3  PLT 253 242 818   Basic Metabolic Panel: Recent Labs  Lab 10/31/19 1104 11/04/19 1406 11/05/19 1301 11/06/19 0423  NA 142 136  --  133*  K 3.3* 3.3*   --  4.6  CL 104 101  --  100  CO2 28 25  --  25  GLUCOSE 111* 147*  --  115*  BUN 19 10  --  6*  CREATININE 0.64 0.65  --  0.55  CALCIUM 8.6* 8.5*  --  8.6*  MG  --   --  1.7  --    GFR: Estimated Creatinine Clearance: 54.2 mL/min (by C-G formula based on SCr of 0.55 mg/dL). Liver Function Tests: Recent Labs  Lab 10/31/19 1104  AST 25  ALT 32  ALKPHOS 68  BILITOT 0.6  PROT 6.3*  ALBUMIN 3.6   No results for input(s): LIPASE, AMYLASE in the last 168 hours. No results for input(s): AMMONIA in the last 168 hours. Coagulation Profile: Recent Labs  Lab 10/31/19 1104  INR 0.9   Cardiac Enzymes: No results for input(s): CKTOTAL, CKMB, CKMBINDEX, TROPONINI in the last 168 hours. BNP (last 3 results) No results for input(s): PROBNP in the last 8760 hours. HbA1C: No results for input(s): HGBA1C in the last 72 hours. CBG: Recent Labs  Lab 11/04/19 1348  GLUCAP 138*   Lipid Profile: No results for input(s): CHOL, HDL, LDLCALC, TRIG, CHOLHDL, LDLDIRECT in the last 72 hours. Thyroid Function Tests: No results for input(s): TSH, T4TOTAL, FREET4, T3FREE, THYROIDAB in the last 72 hours. Anemia Panel: No results for input(s): VITAMINB12, FOLATE, FERRITIN, TIBC, IRON, RETICCTPCT in the last 72 hours. Sepsis Labs: No results for input(s): PROCALCITON, LATICACIDVEN in the last 168 hours.  Recent Results (from the past 240 hour(s))  Surgical pcr screen     Status: None   Collection Time: 10/31/19 11:01 AM   Specimen: Nasal Mucosa; Nasal Swab  Result Value Ref Range Status   MRSA, PCR NEGATIVE NEGATIVE Final   Staphylococcus aureus NEGATIVE NEGATIVE Final    Comment: (NOTE) The Xpert SA Assay (FDA approved for NASAL specimens in patients 3 years of age and older), is one component of a comprehensive surveillance program. It is not intended to diagnose infection nor to guide or monitor treatment. Performed at [facility], [place], [place]  [address]   SARS CORONAVIRUS 2 (TAT 6-24 HRS) Nasopharyngeal Nasopharyngeal Swab     Status: None   Collection Time: 10/31/19 12:50 PM   Specimen: Nasopharyngeal Swab  Result Value Ref Range Status   SARS Coronavirus 2 NEGATIVE NEGATIVE Final    Comment: (NOTE) SARS-CoV-2 target nucleic acids are NOT DETECTED.  The SARS-CoV-2 RNA is generally detectable in upper and lower respiratory specimens during the acute phase of infection. Negative results do not preclude SARS-CoV-2 infection, do not rule out co-infections with other pathogens, and should not be used as the sole basis for treatment or other patient management decisions. Negative results must be combined with clinical observations, patient history, and epidemiological information. The expected result is Negative.  Fact Sheet for Patients: SugarRoll.be  Fact Sheet for Healthcare Providers: https://www.woods-mathews.com/  This test is not yet approved or cleared by the [place] FDA  and  has been authorized for detection and/or diagnosis of SARS-CoV-2 by FDA under an Emergency Use Authorization (EUA). This EUA will remain  in effect (meaning this test can be used) for the duration of the COVID-19 declaration under Se ction 564(b)(1) of the Act, 21 U.S.C. section 360bbb-3(b)(1), unless the authorization is terminated or revoked sooner.  Performed at [facility], [place] [address] [place], [place] [address]      Radiology Studies: DG Chest Port 1 View  Result Date: 11/06/2019 CLINICAL DATA:  Postop check. EXAM: PORTABLE CHEST 1 VIEW COMPARISON:  11/04/2019. FINDINGS: Two right chest tubes in stable position. No pneumothorax identified. Cardiac monitor device in stable position. Stable cardiomegaly. Stable left base atelectasis. Postsurgical changes right lung. Mild right chest wall subcutaneous emphysema again noted. Prior cervical fusion. IMPRESSION: 1. Two right chest tubes in  stable position. No pneumothorax identified. Stable right chest wall mild subcutaneous emphysema. 2. Stable left base atelectasis. Stable postsurgical changes right chest. 3. Stable cardiomegaly. Electronically Signed   By: [place]   On: 11/06/2019 05:32    Scheduled Meds: . amLODipine  10 mg Oral Daily  . bisacodyl  10 mg Oral Daily  . Chlorhexidine Gluconate Cloth  6 each Topical Daily  . ipratropium-albuterol  3 mL Nebulization TID  . methocarbamol  500 mg Oral QID  . metoprolol tartrate  25 mg Oral BID  . mometasone-formoterol  2 puff Inhalation BID  . umeclidinium bromide  1 puff Inhalation Daily   Continuous Infusions:   LOS: 2 days   Time spent: 30 minutes.  Lorella Nimrod, MD [facility]  If 7PM-7AM, please contact night-coverage [email or web address]  11/06/2019, 5:46 PM   This record has been created using Systems analyst. Errors have been sought and corrected,but may not always be located. Such creation errors do not reflect on the standard of care.

## 2019-11-07 LAB — BPAM RBC
Blood Product Expiration Date: 202111212359
Blood Product Expiration Date: 202111212359
Unit Type and Rh: 6200
Unit Type and Rh: 6200

## 2019-11-07 LAB — TYPE AND SCREEN
ABO/RH(D): A POS
Antibody Screen: NEGATIVE
Unit division: 0
Unit division: 0

## 2019-11-07 LAB — PREPARE RBC (CROSSMATCH)

## 2019-11-07 MED ORDER — ORAL CARE MOUTH RINSE
15.0000 mL | Freq: Two times a day (BID) | OROMUCOSAL | Status: DC
  Administered 2019-11-07 – 2019-11-11 (×9): 15 mL via OROMUCOSAL

## 2019-11-07 NOTE — Progress Notes (Signed)
PROGRESS NOTE    Ann [name]  OZD:[id number] DOB: 08-Mar-1955 DOA: 11/04/2019 PCP: Kirk Ruths, MD   Brief Narrative: Taken from prior consult note. Ann [name] is an 64 y.o. female with a past medical history of hypertension, hyperlipidemia, COPD, stroke, CAD, stent placement, who is admitted due to right lung mass (suspecting malignancy) by thoracic surgeon. Pt underwent right thoracotomy and right upper lobectomy 11/04/19. We are asked to consult due to difficulty in controlling blood pressure.  Subjective: Patient was feeling better when seen today. Pain was better controlled. Denies any new complaint.  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  Blood pressure was within goal today.  She was started on amlodipine and spironolactone was discontinued, amlodipine dose was increased yesterday. -Continue amlodipine to 10 mg daily. -Continue Lopressor at 25 mg twice daily. -Continue with pain management as it should improve her blood pressure. -IV hydralazine as needed for systolic above 259.  Lung mass:  Per Dr. Gerome Apley note, Pt was recently found to have a PET +1 cm right upper lobe spiculated mass that was most consistent with a malignancy. Pt underwent right thoracotomy and right upper lobectomy 11/04/19. Pt has moderate pain in surgical site currently. -Pain management per primary thoracic surgeon team -they discussed the role of radiation therapy with pt.  Hx of Coronary artery disease involving native coronary artery of native heart without angina pectoris: s/p of CAD -hold ASA due to RIGHT hemithorax. -pt is not taking Crestor  Hypokalemia: Resolved. -Monitor and replete as  needed.  HLD (hyperlipidemia) -pt is not taking Crestor -f/u with PCP  COPD (chronic obstructive pulmonary disease) ([place]): stable - prn albuterol nebs and bronchodilators  Stroke (HCC) -Hold ASA as above  Leukocytosis: WBC 12.0. No fever. No source of infection identified, likely reactive.. -Follow-up by CBC  Objective: Vitals:   11/06/19 2313 11/07/19 0318 11/07/19 0810 11/07/19 1212  BP: 120/75 126/73 130/81 (!) 149/83  Pulse: 73 75 79 80  Resp: 18 20 18 16   Temp: 97.9 F (36.6 C) 98 F (36.7 C) 98.4 F (36.9 C) 98.1 F (36.7 C)  TempSrc: Oral Oral Oral Oral  SpO2: 98% 99% 100% 99%  Weight:      Height:        Intake/Output Summary (Last 24 hours) at 11/07/2019 1521 Last data filed at 11/07/2019 0505 Gross per 24 hour  Intake 0 ml  Output 1190 ml  Net -1190 ml   Filed Weights   11/04/19 0617  Weight: 49 kg    Examination:  General. Frail, lethargic lady, in no acute distress. Pulmonary.  Lungs clear bilaterally, normal respiratory effort. CV.  Regular rate and rhythm, no JVD, rub or murmur. Abdomen.  Soft, nontender, nondistended, BS positive. CNS.  Alert and oriented x3.  No focal neurologic deficit. Extremities.  No edema, no cyanosis, pulses intact and symmetrical. Psychiatry.  Judgment and insight appears normal.   Status is: Inpatient  We are consultant on this patient.  Thoracic surgery is primary.   Procedures:  Antimicrobials:   Data Reviewed: I have personally reviewed following labs and imaging studies  CBC: Recent Labs  Lab 11/04/19 1406 11/06/19 0423  WBC 12.0* 9.0  HGB  13.6 12.9  HCT 40.8 39.4  MCV 97.8 98.3  PLT 242 532   Basic Metabolic Panel: Recent Labs  Lab 11/04/19 1406 11/05/19 1301 11/06/19 0423  NA 136  --  133*  K 3.3*  --  4.6  CL 101  --  100  CO2 25  --  25  GLUCOSE 147*  --  115*  BUN 10  --  6*  CREATININE 0.65  --  0.55  CALCIUM 8.5*  --  8.6*  MG  --  1.7  --    GFR: Estimated Creatinine  Clearance: 54.2 mL/min (by C-G formula based on SCr of 0.55 mg/dL). Liver Function Tests: No results for input(s): AST, ALT, ALKPHOS, BILITOT, PROT, ALBUMIN in the last 168 hours. No results for input(s): LIPASE, AMYLASE in the last 168 hours. No results for input(s): AMMONIA in the last 168 hours. Coagulation Profile: No results for input(s): INR, PROTIME in the last 168 hours. Cardiac Enzymes: No results for input(s): CKTOTAL, CKMB, CKMBINDEX, TROPONINI in the last 168 hours. BNP (last 3 results) No results for input(s): PROBNP in the last 8760 hours. HbA1C: No results for input(s): HGBA1C in the last 72 hours. CBG: Recent Labs  Lab 11/04/19 1348  GLUCAP 138*   Lipid Profile: No results for input(s): CHOL, HDL, LDLCALC, TRIG, CHOLHDL, LDLDIRECT in the last 72 hours. Thyroid Function Tests: No results for input(s): TSH, T4TOTAL, FREET4, T3FREE, THYROIDAB in the last 72 hours. Anemia Panel: No results for input(s): VITAMINB12, FOLATE, FERRITIN, TIBC, IRON, RETICCTPCT in the last 72 hours. Sepsis Labs: No results for input(s): PROCALCITON, LATICACIDVEN in the last 168 hours.  Recent Results (from the past 240 hour(s))  Surgical pcr screen     Status: None   Collection Time: 10/31/19 11:01 AM   Specimen: Nasal Mucosa; Nasal Swab  Result Value Ref Range Status   MRSA, PCR NEGATIVE NEGATIVE Final   Staphylococcus aureus NEGATIVE NEGATIVE Final    Comment: (NOTE) The Xpert SA Assay (FDA approved for NASAL specimens in patients 65 years of age and older), is one component of a comprehensive surveillance program. It is not intended to diagnose infection nor to guide or monitor treatment. Performed at [facility], [place], [place] [address]   SARS CORONAVIRUS 2 (TAT 6-24 HRS) Nasopharyngeal Nasopharyngeal Swab     Status: None   Collection Time: 10/31/19 12:50 PM   Specimen: Nasopharyngeal Swab  Result Value Ref Range Status   SARS Coronavirus 2  NEGATIVE NEGATIVE Final    Comment: (NOTE) SARS-CoV-2 target nucleic acids are NOT DETECTED.  The SARS-CoV-2 RNA is generally detectable in upper and lower respiratory specimens during the acute phase of infection. Negative results do not preclude SARS-CoV-2 infection, do not rule out co-infections with other pathogens, and should not be used as the sole basis for treatment or other patient management decisions. Negative results must be combined with clinical observations, patient history, and epidemiological information. The expected result is Negative.  Fact Sheet for Patients: SugarRoll.be  Fact Sheet for Healthcare Providers: https://www.woods-mathews.com/  This test is not yet approved or cleared by the [place] FDA and  has been authorized for detection and/or diagnosis of SARS-CoV-2 by FDA under an Emergency Use Authorization (EUA). This EUA will remain  in effect (meaning this test can be used) for the duration of the COVID-19 declaration under Se ction 564(b)(1) of the Act, 21 U.S.C. section 360bbb-3(b)(1), unless the authorization is terminated or revoked sooner.  Performed at [facility], [place] [address], [place], [place] [address]      Radiology Studies: DG Chest Port 1 View  Result Date: 11/06/2019 CLINICAL DATA:  Postop check. EXAM: PORTABLE CHEST 1 VIEW COMPARISON:  11/04/2019. FINDINGS: Two right chest tubes in stable position. No pneumothorax identified. Cardiac monitor device in stable position. Stable cardiomegaly. Stable left base atelectasis. Postsurgical changes right lung. Mild right chest wall subcutaneous emphysema again noted. Prior cervical fusion. IMPRESSION: 1. Two right chest tubes in stable position. No pneumothorax identified. Stable right chest wall mild subcutaneous emphysema. 2. Stable left base atelectasis. Stable postsurgical changes right chest. 3. Stable cardiomegaly. Electronically Signed    By: [place]   On: 11/06/2019 05:32    Scheduled Meds: . amLODipine  10 mg Oral Daily  . bisacodyl  10 mg Oral Daily  . Chlorhexidine Gluconate Cloth  6 each Topical Daily  . mouth rinse  15 mL Mouth Rinse BID  . methocarbamol  500 mg Oral QID  . metoprolol tartrate  25 mg Oral BID  . mometasone-formoterol  2 puff Inhalation BID  . umeclidinium bromide  1 puff Inhalation Daily   Continuous Infusions:   LOS: 3 days   Time spent: 25 minutes.  Lorella Nimrod, MD [facility]  If 7PM-7AM, please contact night-coverage [email or web address]  11/07/2019, 3:21 PM   This record has been created using Systems analyst. Errors have been sought and corrected,but may not always be located. Such creation errors do not reflect on the standard of care.

## 2019-11-07 NOTE — Care Management Important Message (Signed)
Important Message  Patient Details  Name: Ann [name] MRN: [id number] Date of Birth: [birth year]   Medicare Important Message Given:  N/A - LOS <3 / Initial given by admissions     Juliann Pulse A Tamea Bai 11/07/2019, 8:33 AM

## 2019-11-07 NOTE — Progress Notes (Signed)
[facility] Hospital Day(s): 3.   Post op day(s): 3 Days Post-Op.   Interval History:  Patient seen and examined no acute events or new complaints overnight.  Patient reports she is doing okay, does have intermittent right sided chest sore, at the worst is a 7/10, improved with medications No fever, chills, nausea, SOB No new labs this morning No new imaging this morning; last CXR on 11/04 was without PTX Chest tubes x2 have remained to suction (-20 cm); output 240 ccs total; serosanguinous  She has been on heart healthy diet Worked with therapies yesterday;l recommending HHPT   Vital signs in last 24 hours: [min-max] current  Temp:  [97.7 F (36.5 C)-98.3 F (36.8 C)] 98 F (36.7 C) (11/05 0318) Pulse Rate:  [68-89] 75 (11/05 0318) Resp:  [15-20] 20 (11/05 0318) BP: (118-170)/(73-82) 126/73 (11/05 0318) SpO2:  [91 %-100 %] 99 % (11/05 0318)     Height: 5\' 2"  (157.5 cm) Weight: 49 kg BMI (Calculated): 19.75   Intake/Output last 2 shifts:  11/04 0701 - 11/05 0700 In: 0  Out: 1965 [Urine:1675; Drains:50; Chest Tube:240]   Physical Exam:  Constitutional: alert, cooperative and no distress  Respiratory: breathing non-labored at rest  Cardiovascular: regular rate and sinus rhythm  Chest: Chest tube x2 and blake drain in the right lateral chest wall, sites are CDI, output serosanguinous throughout all drains. No air leak appreciable Integumentary: Thoracotomy incision is CDI, no erythema or drainage   Labs:  CBC Latest Ref Rng & Units 11/06/2019 11/04/2019 10/31/2019  WBC 4.0 - 10.5 K/uL 9.0 12.0(H) 6.2  Hemoglobin 12.0 - 15.0 g/dL 12.9 13.6 12.7  Hematocrit 36 - 46 % 39.4 40.8 38.5  Platelets 150 - 400 K/uL 224 242 253   CMP Latest Ref Rng & Units 11/06/2019 11/04/2019 10/31/2019  Glucose 70 - 99 mg/dL 115(H) 147(H) 111(H)  BUN 8 - 23 mg/dL 6(L) 10 19  Creatinine 0.44 - 1.00 mg/dL 0.55 0.65 0.64  Sodium 135 - 145 mmol/L 133(L) 136  142  Potassium 3.5 - 5.1 mmol/L 4.6 3.3(L) 3.3(L)  Chloride 98 - 111 mmol/L 100 101 104  CO2 22 - 32 mmol/L 25 25 28   Calcium 8.9 - 10.3 mg/dL 8.6(L) 8.5(L) 8.6(L)  Total Protein 6.5 - 8.1 g/dL - - 6.3(L)  Total Bilirubin 0.3 - 1.2 mg/dL - - 0.6  Alkaline Phos 38 - 126 U/L - - 68  AST 15 - 41 U/L - - 25  ALT 0 - 44 U/L - - 32    Imaging studies: No new pertinent imaging studies   Assessment/Plan: 1 64 y.o. 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   - Will place chest tubes to water seal; monitor and record output  - Continue blake drain; monitor and record output  - Repeat CXR in the morning; may be able to sequentially remove chest tube starting tomorrow   - Pulmonary toilet; incentive spirometer    - Pain control prn  - mobilization encouraged; continue working with therapies.   All of the above findings and recommendations were discussed with the patient, and the medical team, and all of patient's questions were answered to her expressed satisfaction.  -- Edison Simon, PA-C [facility] 11/07/2019, 7:23 AM [phone] M-F: 7am - 4pm

## 2019-11-07 NOTE — TOC Progression Note (Signed)
Transition of Care [facility]) - Progression Note    Patient Details  Name: Ann [name] MRN: [id number] Date of Birth: September 20, 1955  Transition of Care [facility]) CM/SW Contact  Beverly Sessions, RN Phone Number: 11/07/2019, 3:38 PM  Clinical Narrative:    Followed up with patient she is agreeable to home health services.  States she does not have a preference of home health agency. Referral made and accepted by [facility] with [place]    Expected Discharge Plan: [place]    Expected Discharge Plan and Services Expected Discharge Plan: [place]: RN, PT [facility] Agency: [place] ([place]) Date [place]: 11/07/19   Representative spoke with at [place]: [place] ([place]) Interventions    Readmission Risk Interventions No flowsheet data found.

## 2019-11-08 ENCOUNTER — Inpatient Hospital Stay: Payer: Medicare Other

## 2019-11-08 LAB — CBC
HCT: 36.9 % (ref 36.0–46.0)
Hemoglobin: 12 g/dL (ref 12.0–15.0)
MCH: 32 pg (ref 26.0–34.0)
MCHC: 32.5 g/dL (ref 30.0–36.0)
MCV: 98.4 fL (ref 80.0–100.0)
Platelets: 253 10*3/uL (ref 150–400)
RBC: 3.75 MIL/uL — ABNORMAL LOW (ref 3.87–5.11)
RDW: 14.8 % (ref 11.5–15.5)
WBC: 5.8 10*3/uL (ref 4.0–10.5)
nRBC: 0 % (ref 0.0–0.2)

## 2019-11-08 LAB — BASIC METABOLIC PANEL
Anion gap: 12 (ref 5–15)
BUN: 10 mg/dL (ref 8–23)
CO2: 28 mmol/L (ref 22–32)
Calcium: 9.1 mg/dL (ref 8.9–10.3)
Chloride: 94 mmol/L — ABNORMAL LOW (ref 98–111)
Creatinine, Ser: 0.61 mg/dL (ref 0.44–1.00)
GFR, Estimated: >60 mL/min (ref >60)
Glucose, Bld: 92 mg/dL (ref 70–99)
Potassium: 4.2 mmol/L (ref 3.5–5.1)
Sodium: 134 mmol/L — ABNORMAL LOW (ref 135–145)

## 2019-11-08 NOTE — Progress Notes (Signed)
PROGRESS NOTE    Ann [name]  TSV:[id number] DOB: 1955-04-15 DOA: 11/04/2019 PCP: Kirk Ruths, MD   Brief Narrative: Taken from prior consult note. Ann [name] is an 64 y.o. female with a past medical history of hypertension, hyperlipidemia, COPD, stroke, CAD, stent placement, who is admitted due to right lung mass (suspecting malignancy) by thoracic surgeon. Pt underwent right thoracotomy and right upper lobectomy 11/04/19. We are asked to consult due to difficulty in controlling blood pressure.  Subjective: Patient was complaining of 7/10 pain when seen today.  Stating that the pain started when they took her downstairs for imaging.  She was accompanied by her husband in the room.  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  Blood pressure was within goal today.  She was started on amlodipine and spironolactone was discontinued, amlodipine dose was increased on 11/06/19.. -Continue amlodipine to 10 mg daily. -Continue Lopressor at 25 mg twice daily. -Continue with pain management as it should improve her blood pressure. -IV hydralazine as needed for systolic above 923.  Lung mass:  Per Dr. Gerome Apley note, Pt was recently found to have a PET +1 cm right upper lobe spiculated mass that was most consistent with a malignancy. Pt underwent right thoracotomy and right upper lobectomy 11/04/19. Pt has moderate pain in surgical site currently. -Pain management per primary thoracic surgeon team -they discussed the role of radiation therapy with pt.  Hx of Coronary artery disease involving native coronary artery of native heart without angina pectoris: s/p of CAD -hold ASA due to RIGHT  hemithorax. -pt is not taking Crestor  Hypokalemia: Resolved. -Monitor and replete as needed.  HLD (hyperlipidemia) -pt is not taking Crestor -f/u with PCP  COPD (chronic obstructive pulmonary disease) ([place]): stable - prn albuterol nebs and bronchodilators  Stroke (HCC) -Hold ASA as above  Leukocytosis: WBC 12.0. No fever. No source of infection identified, likely reactive.. -Follow-up by CBC  Objective: Vitals:   11/08/19 0439 11/08/19 0738 11/08/19 1129 11/08/19 1529  BP: 126/80 (!) 147/91 125/73 132/81  Pulse: 81 85 76 79  Resp: 16 18 17 16   Temp: 98.6 F (37 C) 98.3 F (36.8 C) 98.1 F (36.7 C) 97.9 F (36.6 C)  TempSrc: Oral Oral Oral Oral  SpO2: 99% 99% 99% 99%  Weight:      Height:        Intake/Output Summary (Last 24 hours) at 11/08/2019 1532 Last data filed at 11/08/2019 0651 Gross per 24 hour  Intake --  Output 730 ml  Net -730 ml   Filed Weights   11/04/19 0617  Weight: 49 kg    Examination:  General.  Frail lady, in no acute distress. Pulmonary.  Lungs clear bilaterally, normal respiratory effort, right chest tube. CV.  Regular rate and rhythm, no JVD, rub or murmur. Abdomen.  Soft, nontender, nondistended, BS positive. CNS.  Alert and oriented x3.  No focal neurologic deficit. Extremities.  No edema, no cyanosis, pulses intact and symmetrical. Psychiatry.  Judgment and insight appears normal.  Status is: Inpatient  We are consultant on this patient.  Thoracic surgery is primary.   Procedures:  Antimicrobials:   Data Reviewed: I have personally reviewed following labs  and imaging studies  CBC: Recent Labs  Lab 11/04/19 1406 11/06/19 0423 11/08/19 0346  WBC 12.0* 9.0 5.8  HGB 13.6 12.9 12.0  HCT 40.8 39.4 36.9  MCV 97.8 98.3 98.4  PLT 242 224 176   Basic Metabolic Panel: Recent Labs  Lab 11/04/19 1406 11/05/19 1301 11/06/19 0423 11/08/19 0346  NA 136  --  133* 134*  K 3.3*  --  4.6 4.2  CL 101  --  100 94*  CO2 25   --  25 28  GLUCOSE 147*  --  115* 92  BUN 10  --  6* 10  CREATININE 0.65  --  0.55 0.61  CALCIUM 8.5*  --  8.6* 9.1  MG  --  1.7  --   --    GFR: Estimated Creatinine Clearance: 54.2 mL/min (by C-G formula based on SCr of 0.61 mg/dL). Liver Function Tests: No results for input(s): AST, ALT, ALKPHOS, BILITOT, PROT, ALBUMIN in the last 168 hours. No results for input(s): LIPASE, AMYLASE in the last 168 hours. No results for input(s): AMMONIA in the last 168 hours. Coagulation Profile: No results for input(s): INR, PROTIME in the last 168 hours. Cardiac Enzymes: No results for input(s): CKTOTAL, CKMB, CKMBINDEX, TROPONINI in the last 168 hours. BNP (last 3 results) No results for input(s): PROBNP in the last 8760 hours. HbA1C: No results for input(s): HGBA1C in the last 72 hours. CBG: Recent Labs  Lab 11/04/19 1348  GLUCAP 138*   Lipid Profile: No results for input(s): CHOL, HDL, LDLCALC, TRIG, CHOLHDL, LDLDIRECT in the last 72 hours. Thyroid Function Tests: No results for input(s): TSH, T4TOTAL, FREET4, T3FREE, THYROIDAB in the last 72 hours. Anemia Panel: No results for input(s): VITAMINB12, FOLATE, FERRITIN, TIBC, IRON, RETICCTPCT in the last 72 hours. Sepsis Labs: No results for input(s): PROCALCITON, LATICACIDVEN in the last 168 hours.  Recent Results (from the past 240 hour(s))  Surgical pcr screen     Status: None   Collection Time: 10/31/19 11:01 AM   Specimen: Nasal Mucosa; Nasal Swab  Result Value Ref Range Status   MRSA, PCR NEGATIVE NEGATIVE Final   Staphylococcus aureus NEGATIVE NEGATIVE Final    Comment: (NOTE) The Xpert SA Assay (FDA approved for NASAL specimens in patients 50 years of age and older), is one component of a comprehensive surveillance program. It is not intended to diagnose infection nor to guide or monitor treatment. Performed at [facility], [place], [place] [address]   SARS CORONAVIRUS 2 (TAT 6-24 HRS)  Nasopharyngeal Nasopharyngeal Swab     Status: None   Collection Time: 10/31/19 12:50 PM   Specimen: Nasopharyngeal Swab  Result Value Ref Range Status   SARS Coronavirus 2 NEGATIVE NEGATIVE Final    Comment: (NOTE) SARS-CoV-2 target nucleic acids are NOT DETECTED.  The SARS-CoV-2 RNA is generally detectable in upper and lower respiratory specimens during the acute phase of infection. Negative results do not preclude SARS-CoV-2 infection, do not rule out co-infections with other pathogens, and should not be used as the sole basis for treatment or other patient management decisions. Negative results must be combined with clinical observations, patient history, and epidemiological information. The expected result is Negative.  Fact Sheet for Patients: SugarRoll.be  Fact Sheet for Healthcare Providers: https://www.woods-mathews.com/  This test is not yet approved or cleared by the [place] FDA and  has been authorized for detection and/or diagnosis of SARS-CoV-2 by FDA under an Emergency Use Authorization (EUA). This EUA will remain  in effect (meaning this test can be used) for the duration of the COVID-19 declaration under Se ction 564(b)(1) of the Act, 21 U.S.C. section 360bbb-3(b)(1), unless the authorization is terminated or revoked sooner.  Performed at [facility], [place] [address] [place], [place] [address]      Radiology Studies: DG Chest 2 View  Result Date: 11/08/2019 CLINICAL DATA:  Chest tube in place EXAM: CHEST - 2 VIEW COMPARISON:  11/06/2019 chest radiograph. FINDINGS: Loop recorder overlies the left heart. Partially visualized surgical hardware from ACDF. Apical and basilar right chest tubes are stable in position. Skin staples overlie the lower outer right chest. Stable cardiomediastinal silhouette with top-normal heart size. No pneumothorax. No pleural effusion. No pulmonary edema. Mild left basilar  atelectasis, improved. IMPRESSION: 1. No pneumothorax. Stable right chest tubes. 2. Mild left basilar atelectasis, improved. Electronically Signed   By: Ilona Sorrel M.D.   On: 11/08/2019 09:04    Scheduled Meds: . amLODipine  10 mg Oral Daily  . bisacodyl  10 mg Oral Daily  . mouth rinse  15 mL Mouth Rinse BID  . methocarbamol  500 mg Oral QID  . metoprolol tartrate  25 mg Oral BID  . mometasone-formoterol  2 puff Inhalation BID  . umeclidinium bromide  1 puff Inhalation Daily   Continuous Infusions:   LOS: 4 days   Time spent: 25 minutes.  Lorella Nimrod, MD [facility]  If 7PM-7AM, please contact night-coverage [email or web address]  11/08/2019, 3:32 PM   This record has been created using Systems analyst. Errors have been sought and corrected,but may not always be located. Such creation errors do not reflect on the standard of care.

## 2019-11-08 NOTE — Progress Notes (Signed)
POD # 4 CT 160cc, Lung up CXR  PE NAD Wound healing well no infection, BS clear, I removed apical CT w/o complications  A/p mobilize Keep CT to water seal CXR tomorrow if ok may remove remaining tube

## 2019-11-08 NOTE — Progress Notes (Signed)
PT Cancellation Note  Patient Details Name: Ann [name] MRN: [id number] DOB: 1955/03/28   Cancelled Treatment:    Reason Eval/Treat Not Completed: Other (comment) Attempted to see pt twice today. First attempt, pt in a lot of pain and just received pain meds. Pt requested clinician to return later. Second attempt, pt had one of her two chest tubes removed recently and stated that she is in too much pain to do anything today. Will attempt another date/time when appropriate.   [name] 11/08/2019, 1:36 PM

## 2019-11-08 NOTE — Progress Notes (Signed)
Order received from Dr Dahlia Byes to discontinue telemetry and pulse ox

## 2019-11-09 ENCOUNTER — Inpatient Hospital Stay: Payer: Medicare Other

## 2019-11-09 MED ORDER — SODIUM CHLORIDE 0.9 % IV BOLUS
1000.0000 mL | Freq: Once | INTRAVENOUS | Status: AC
  Administered 2019-11-09: 1000 mL via INTRAVENOUS

## 2019-11-09 MED ORDER — PREGABALIN 50 MG PO CAPS
100.0000 mg | ORAL_CAPSULE | Freq: Three times a day (TID) | ORAL | Status: DC
  Administered 2019-11-09 – 2019-11-11 (×7): 100 mg via ORAL
  Filled 2019-11-09 (×7): qty 2

## 2019-11-09 MED ORDER — ACETAMINOPHEN 500 MG PO TABS
1000.0000 mg | ORAL_TABLET | Freq: Four times a day (QID) | ORAL | Status: DC
  Administered 2019-11-09 – 2019-11-10 (×4): 1000 mg via ORAL
  Filled 2019-11-09 (×3): qty 2

## 2019-11-09 MED ORDER — KETOROLAC TROMETHAMINE 15 MG/ML IJ SOLN
15.0000 mg | Freq: Four times a day (QID) | INTRAMUSCULAR | Status: DC
  Administered 2019-11-09 – 2019-11-10 (×4): 15 mg via INTRAVENOUS
  Filled 2019-11-09 (×4): qty 1

## 2019-11-09 NOTE — Progress Notes (Signed)
   11/09/19 1100  Assess: MEWS Score  Temp 98.3 F (36.8 C)  BP 102/70  Pulse Rate (!) 126 (RN Aylen Rambert notified)  Resp 17  SpO2 99 %  Assess: MEWS Score  MEWS Temp 0  MEWS Systolic 0  MEWS Pulse 2  MEWS RR 0  MEWS LOC 0  MEWS Score 2  MEWS Score Color Yellow  Assess: if the MEWS score is Yellow or Red  Were vital signs taken at a resting state? Yes  Focused Assessment Change from prior assessment (see assessment flowsheet)  Early Detection of Sepsis Score *See Row Information* Low  MEWS guidelines implemented *See Row Information* Yes  Treat  MEWS Interventions Escalated (See documentation below)  Take Vital Signs  Increase Vital Sign Frequency  Yellow: Q 2hr X 2 then Q 4hr X 2, if remains yellow, continue Q 4hrs  Escalate  MEWS: Escalate Yellow: discuss with charge nurse/RN and consider discussing with provider and RRT  Notify: Charge Nurse/RN  Name of Charge Nurse/RN Notified Danae Chen RN  Date Charge Nurse/RN Notified 11/09/19  Time Charge Nurse/RN Notified 1100  Document  Patient Outcome Other (Comment) (continue to monitor)

## 2019-11-09 NOTE — Progress Notes (Signed)
PROGRESS NOTE    ELOUISE DIVELBISS  QTM:[id number] DOB: 1955/06/19 DOA: 11/04/2019 PCP: Kirk Ruths, MD   Brief Narrative: Taken from prior consult note. MELLONY DANZIGER is an 64 y.o. female with a past medical history of hypertension, hyperlipidemia, COPD, stroke, CAD, stent placement, who is admitted due to right lung mass (suspecting malignancy) by thoracic surgeon. Pt underwent right thoracotomy and right upper lobectomy 11/04/19. We are asked to consult due to difficulty in controlling blood pressure.  Subjective: Patient was feeling better when seen today.  5-6 out of 10 pain but states that it is bearable.  Husband at bedside.  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  Blood pressure was within goal today.  She was started on amlodipine and spironolactone was discontinued, amlodipine dose was increased on 11/06/19.. -Continue amlodipine to 10 mg daily. -Continue Lopressor at 25 mg twice daily. -Continue with pain management as it should improve her blood pressure. -IV hydralazine as needed for systolic above 625. -Patient can continue current regimen as blood pressure remained within goal with this and can be discharged home with this regimen to follow-up with her primary care provider.  Lung mass:  Per Dr. Gerome Apley note, Pt was recently found to have a PET +1 cm right upper lobe spiculated mass that was most consistent with a malignancy. Pt underwent right thoracotomy and right upper lobectomy 11/04/19. Pt has moderate pain in surgical site currently. -Pain management per primary thoracic surgeon team -they discussed the role of radiation therapy with pt.  Hx of Coronary artery disease  involving native coronary artery of native heart without angina pectoris: s/p of CAD -hold ASA due to RIGHT hemithorax. -pt is not taking Crestor  Hypokalemia: Resolved. -Monitor and replete as needed.  HLD (hyperlipidemia) -pt is not taking Crestor -f/u with PCP  COPD (chronic obstructive pulmonary disease) ([place]): stable - prn albuterol nebs and bronchodilators  Stroke (HCC) -Hold ASA as above  Leukocytosis: WBC 12.0. No fever. No source of infection identified, likely reactive.. -Follow-up by CBC  Objective: Vitals:   11/09/19 0436 11/09/19 0808 11/09/19 1100 11/09/19 1327  BP: 128/74 129/82 102/70 97/73  Pulse: (!) 101 86 (!) 126 (!) 119  Resp: 20  17   Temp: 98.8 F (37.1 C) 97.9 F (36.6 C) 98.3 F (36.8 C) 98.6 F (37 C)  TempSrc: Oral Oral Oral Oral  SpO2: 100% 100% 99% 99%  Weight:      Height:        Intake/Output Summary (Last 24 hours) at 11/09/2019 1455 Last data filed at 11/09/2019 0531 Gross per 24 hour  Intake 240 ml  Output 625 ml  Net -385 ml   Filed Weights   11/04/19 0617  Weight: 49 kg    Examination:  General.  Frail lady, in no acute distress. Pulmonary.  Lungs clear bilaterally, normal respiratory effort.  Right-sided chest tube in place. CV.  Regular rate and rhythm, no JVD, rub or murmur. Abdomen.  Soft, nontender, nondistended, BS positive. CNS.  Alert and oriented x3.  No focal neurologic deficit. Extremities.  No edema, no cyanosis, pulses intact and symmetrical. Psychiatry.  Judgment and insight appears normal.  Status is: Inpatient  We  are consultant on this patient.  Thoracic surgery is primary. Patient's blood pressure is stable on current regimen she can be discharged home with this and will follow up with her primary care provider for further management. We will sign off at this time.  Procedures:  Antimicrobials:   Data Reviewed: I have personally reviewed following labs and imaging studies  CBC: Recent Labs    Lab 11/29/19 1406 11/06/19 0423 11/08/19 0346  WBC 12.0* 9.0 5.8  HGB 13.6 12.9 12.0  HCT 40.8 39.4 36.9  MCV 97.8 98.3 98.4  PLT 242 224 948   Basic Metabolic Panel: Recent Labs  Lab 11-29-2019 1406 11/05/19 1301 11/06/19 0423 11/08/19 0346  NA 136  --  133* 134*  K 3.3*  --  4.6 4.2  CL 101  --  100 94*  CO2 25  --  25 28  GLUCOSE 147*  --  115* 92  BUN 10  --  6* 10  CREATININE 0.65  --  0.55 0.61  CALCIUM 8.5*  --  8.6* 9.1  MG  --  1.7  --   --    GFR: Estimated Creatinine Clearance: 54.2 mL/min (by C-G formula based on SCr of 0.61 mg/dL). Liver Function Tests: No results for input(s): AST, ALT, ALKPHOS, BILITOT, PROT, ALBUMIN in the last 168 hours. No results for input(s): LIPASE, AMYLASE in the last 168 hours. No results for input(s): AMMONIA in the last 168 hours. Coagulation Profile: No results for input(s): INR, PROTIME in the last 168 hours. Cardiac Enzymes: No results for input(s): CKTOTAL, CKMB, CKMBINDEX, TROPONINI in the last 168 hours. BNP (last 3 results) No results for input(s): PROBNP in the last 8760 hours. HbA1C: No results for input(s): HGBA1C in the last 72 hours. CBG: Recent Labs  Lab 2019-11-29 1348  GLUCAP 138*   Lipid Profile: No results for input(s): CHOL, HDL, LDLCALC, TRIG, CHOLHDL, LDLDIRECT in the last 72 hours. Thyroid Function Tests: No results for input(s): TSH, T4TOTAL, FREET4, T3FREE, THYROIDAB in the last 72 hours. Anemia Panel: No results for input(s): VITAMINB12, FOLATE, FERRITIN, TIBC, IRON, RETICCTPCT in the last 72 hours. Sepsis Labs: No results for input(s): PROCALCITON, LATICACIDVEN in the last 168 hours.  Recent Results (from the past 240 hour(s))  Surgical pcr screen     Status: None   Collection Time: 10/31/19 11:01 AM   Specimen: Nasal Mucosa; Nasal Swab  Result Value Ref Range Status   MRSA, PCR NEGATIVE NEGATIVE Final   Staphylococcus aureus NEGATIVE NEGATIVE Final    Comment: (NOTE) The Xpert SA Assay  (FDA approved for NASAL specimens in patients 54 years of age and older), is one component of a comprehensive surveillance program. It is not intended to diagnose infection nor to guide or monitor treatment. Performed at [facility], [place], [place] [address]   SARS CORONAVIRUS 2 (TAT 6-24 HRS) Nasopharyngeal Nasopharyngeal Swab     Status: None   Collection Time: 10/31/19 12:50 PM   Specimen: Nasopharyngeal Swab  Result Value Ref Range Status   SARS Coronavirus 2 NEGATIVE NEGATIVE Final    Comment: (NOTE) SARS-CoV-2 target nucleic acids are NOT DETECTED.  The SARS-CoV-2 RNA is generally detectable in upper and lower respiratory specimens during the acute phase of infection. Negative results do not preclude SARS-CoV-2 infection, do not rule out co-infections with other pathogens, and should not be used as the sole basis for treatment or other patient management decisions. Negative results must be combined with clinical observations, patient  history, and epidemiological information. The expected result is Negative.  Fact Sheet for Patients: SugarRoll.be  Fact Sheet for Healthcare Providers: https://www.woods-mathews.com/  This test is not yet approved or cleared by the [place] FDA and  has been authorized for detection and/or diagnosis of SARS-CoV-2 by FDA under an Emergency Use Authorization (EUA). This EUA will remain  in effect (meaning this test can be used) for the duration of the COVID-19 declaration under Se ction 564(b)(1) of the Act, 21 U.S.C. section 360bbb-3(b)(1), unless the authorization is terminated or revoked sooner.  Performed at [facility], [place] [address] [place], [place] [address]      Radiology Studies: DG Chest 2 View  Result Date: 11/08/2019 CLINICAL DATA:  Chest tube in place EXAM: CHEST - 2 VIEW COMPARISON:  11/06/2019 chest radiograph. FINDINGS: Loop recorder  overlies the left heart. Partially visualized surgical hardware from ACDF. Apical and basilar right chest tubes are stable in position. Skin staples overlie the lower outer right chest. Stable cardiomediastinal silhouette with top-normal heart size. No pneumothorax. No pleural effusion. No pulmonary edema. Mild left basilar atelectasis, improved. IMPRESSION: 1. No pneumothorax. Stable right chest tubes. 2. Mild left basilar atelectasis, improved. Electronically Signed   By: Ilona Sorrel M.D.   On: 11/08/2019 09:04    Scheduled Meds: . acetaminophen  1,000 mg Oral Q6H  . amLODipine  10 mg Oral Daily  . bisacodyl  10 mg Oral Daily  . ketorolac  15 mg Intravenous Q6H  . mouth rinse  15 mL Mouth Rinse BID  . methocarbamol  500 mg Oral QID  . metoprolol tartrate  25 mg Oral BID  . mometasone-formoterol  2 puff Inhalation BID  . pregabalin  100 mg Oral TID  . umeclidinium bromide  1 puff Inhalation Daily   Continuous Infusions:   LOS: 5 days   Time spent: 25 minutes.  Lorella Nimrod, MD [facility]  If 7PM-7AM, please contact night-coverage [email or web address]  11/09/2019, 2:55 PM   This record has been created using Systems analyst. Errors have been sought and corrected,but may not always be located. Such creation errors do not reflect on the standard of care.

## 2019-11-09 NOTE — Progress Notes (Signed)
POD # 5 CT 170cc, Lung up CXR no PTX Main complaint is abdominal distention Covering for Dr. Genevive Bi  PE NAD Wound healing well no infection, BS clear, I removed last chest tube CT w/o complications, subq blake drain still in place w serous fluid.  A/p  Early after I removed the chest tube she had a vasovagal.  Fluids were given and she was placed in bed with significant improvement. I do think that she is developing an ileus and we will get her on a clear liquid diet.  We will follow her x-ray tomorrow. There is no evidence of any thoracic bleeding clinically.

## 2019-11-10 ENCOUNTER — Inpatient Hospital Stay: Payer: Medicare Other

## 2019-11-10 ENCOUNTER — Ambulatory Visit (INDEPENDENT_AMBULATORY_CARE_PROVIDER_SITE_OTHER): Payer: Medicare Other

## 2019-11-10 DIAGNOSIS — I639 Cerebral infarction, unspecified: Secondary | ICD-10-CM

## 2019-11-10 LAB — BASIC METABOLIC PANEL
Anion gap: 10 (ref 5–15)
BUN: 11 mg/dL (ref 8–23)
CO2: 28 mmol/L (ref 22–32)
Calcium: 8.5 mg/dL — ABNORMAL LOW (ref 8.9–10.3)
Chloride: 102 mmol/L (ref 98–111)
Creatinine, Ser: 0.58 mg/dL (ref 0.44–1.00)
GFR, Estimated: >60 mL/min (ref >60)
Glucose, Bld: 104 mg/dL — ABNORMAL HIGH (ref 70–99)
Potassium: 3.5 mmol/L (ref 3.5–5.1)
Sodium: 140 mmol/L (ref 135–145)

## 2019-11-10 LAB — CUP PACEART REMOTE DEVICE CHECK
Date Time Interrogation Session: 20211107081611
Implantable Pulse Generator Implant Date: 20210928

## 2019-11-10 MED ORDER — BISACODYL 10 MG RE SUPP
10.0000 mg | Freq: Two times a day (BID) | RECTAL | Status: DC | PRN
  Administered 2019-11-10: 10 mg via RECTAL
  Filled 2019-11-10: qty 1

## 2019-11-10 NOTE — Care Management Important Message (Signed)
Important Message  Patient Details  Name: RANDAL YEPIZ MRN: [id number] Date of Birth: 09/03/1955   Medicare Important Message Given:  Yes     Juliann Pulse A Rihan Schueler 11/10/2019, 1:59 PM

## 2019-11-10 NOTE — Progress Notes (Signed)
Patient ID: Ann [name], female   DOB: Jun 15, 1955, 64 y.o.   MRN: [id number]  She states that she had a quiet evening.  Not short of breath but some discomfort along incision but no real pain  Wounds clean and dry. Lungs clear. Abdomen is soft and nontender  CXRay and KUB reviewed.  No problems.  Will advance diet and encourage ambulation.  Teach patient and family on drain care.    Plan on discharge tomorrow

## 2019-11-10 NOTE — Progress Notes (Signed)
PT Cancellation Note  Patient Details Name: Ann [name] MRN: [id number] DOB: February 14, 1955   Cancelled Treatment:    Reason Eval/Treat Not Completed: Other (comment). Chest tube removed yesterday. Post imaging, new pneumothorax present. No new notes in EMR since yesterday. Unsure of POC or surgical intervention. Will hold therapy at this time until medically cleared.   [name] 11/10/2019, 1:21 PM  Greggory Stallion, PT, DPT [phone]

## 2019-11-11 ENCOUNTER — Telehealth: Payer: Self-pay | Admitting: *Deleted

## 2019-11-11 ENCOUNTER — Other Ambulatory Visit: Payer: Self-pay

## 2019-11-11 DIAGNOSIS — R911 Solitary pulmonary nodule: Secondary | ICD-10-CM

## 2019-11-11 MED ORDER — METHOCARBAMOL 500 MG PO TABS: 500.0000 mg | ORAL_TABLET | Freq: Three times a day (TID) | ORAL | 0 refills | Status: DC | PRN

## 2019-11-11 MED ORDER — IBUPROFEN 600 MG PO TABS: 600.0000 mg | ORAL_TABLET | Freq: Four times a day (QID) | ORAL | 0 refills | Status: DC | PRN

## 2019-11-11 MED ORDER — OXYCODONE HCL 5 MG PO TABS
5.0000 mg | ORAL_TABLET | Freq: Four times a day (QID) | ORAL | 0 refills | Status: DC | PRN
Start: 2019-11-11 — End: 2019-11-24

## 2019-11-11 NOTE — Discharge Summary (Signed)
[facility] SURGICAL DISCHARGE SUMMARY  Patient ID: Ann [name] MRN: [id number] DOB/AGE: 04/13/1955 64 y.o.  Admit date: 11/04/2019 Discharge date: 11/11/2019  Discharge Diagnoses Patient Active Problem List   Diagnosis Date Noted  . Lung mass 11/04/2019    Consultants Medicine  Procedures 11/05/2019: Right thoracotomy with right upper lobectomy  HPI: Ann [name] is a 64 year old African-American female who was recently found to have a PET +1 cm right upper lobe spiculated mass that was most consistent with a malignancy.  In addition she had a 1 cm groundglass opacity just above the minor fissure within the right upper lobe.  They both appeared to be suspicious for lung cancer and she was offered the above name operation for definitive diagnosis and management.  The indications and risks of the surgical procedure were explained the patient in detail and she gave her informed consent.  Hospital Course: Patient underwent uneventful Right thoracotomy with right upper lobectomy (Dr Genevive Bi, 11/05/2019).  Post-operatively, patient's did well and required 1 night stay in ICU. Her chest tubes were sequentially removed beginning on POD4. She did have a post-surgical ileus which resolved. Advancement of patient's diet and ambulation with therapies were well-tolerated. The remainder of patient's hospital course was essentially unremarkable, and discharge planning was initiated accordingly with patient safely able to be discharged home with appropriate discharge instructions, pain control, and outpatient follow-up after all of her questions were answered to her expressed satisfaction.   Discharge Condition: Good   Physical Examination:  Constitutional: Well appearing female, NAD Pulmonary: Normal effort, no respiratory distress Cardiac: HRRR Skin: Right thoracotomy wounds are CDI, drain in place   Allergies as of 11/11/2019      Reactions   Clopidogrel Anaphylaxis, Hives       Medication List    TAKE these medications   aspirin 325 MG tablet Take 325 mg by mouth daily.   fluticasone 50 MCG/ACT nasal spray Commonly known as: FLONASE Place 2 sprays into both nostrils daily as needed for allergies.   furosemide 20 MG tablet Commonly known as: LASIX Take 20 mg by mouth daily as needed for edema.   ibuprofen 600 MG tablet Commonly known as: ADVIL Take 1 tablet (600 mg total) by mouth every 6 (six) hours as needed.   methocarbamol 500 MG tablet Commonly known as: ROBAXIN Take 1 tablet (500 mg total) by mouth every 8 (eight) hours as needed for muscle spasms.   metoprolol tartrate 25 MG tablet Commonly known as: LOPRESSOR Take 25 mg by mouth 2 (two) times daily.   oxyCODONE 5 MG immediate release tablet Commonly known as: Oxy IR/ROXICODONE Take 1 tablet (5 mg total) by mouth every 6 (six) hours as needed for severe pain or breakthrough pain.   rosuvastatin 20 MG tablet Commonly known as: CRESTOR Take 20 mg by mouth daily.   spironolactone 25 MG tablet Commonly known as: ALDACTONE Take 25 mg by mouth daily.   Trelegy Ellipta 100-62.5-25 MCG/INH Aepb Generic drug: Fluticasone-Umeclidin-Vilant Inhale 1 puff into the lungs daily.            Durable Medical Equipment  (From admission, onward)         Start     Ordered   11/11/19 0807  For home use only DME Walker rolling  Once       Question Answer Comment  Walker: With [place] Wheels   Patient needs a walker to treat with the following condition Physical deconditioning      11/11/19  1859            Follow-up Information    Nestor Lewandowsky, MD. Schedule an appointment as soon as possible for a visit in 1 week(s).   Specialties: Cardiothoracic Surgery, General Surgery Why: s/p thoracotomy, needs CXR prior to appointment  Contact information: [address] [place] [address] [phone]                Time spent on discharge management including  discussion of hospital course, clinical condition, outpatient instructions, prescriptions, and follow up with the patient and members of the medical team: >30 minutes  -- Edison Simon , PA-C [facility]  11/11/2019, 8:11 AM [phone] M-F: 7am - 4pm

## 2019-11-11 NOTE — Telephone Encounter (Signed)
Patient notified that her letter is ready and her husband will pick this up.

## 2019-11-11 NOTE — TOC Transition Note (Signed)
Transition of Care [facility]) - CM/SW Discharge Note   Patient Details  Name: Ann [name] MRN: [id number] Date of Birth: 1955/12/07  Transition of Care [facility]) CM/SW Contact:  Beverly Sessions, RN Phone Number: 11/11/2019, 1:07 PM   Clinical Narrative:     Patient was discharged home today RW delivered to room by patricia with [employer]  Corene Cornea with [place] notified of discharge.  He is aware that patient will be discharging with drain in place   Final next level of care: [place] Barriers to Discharge: No Barriers Identified   Patient Goals and CMS Choice        Discharge Placement                       Discharge Plan and Services                          HH Arranged: RN, PT [facility] Agency: [place] [facility]) Date [facility] Agency Contacted: 11/11/19   Representative spoke with at [place]: [place] ([place]) Interventions     Readmission Risk Interventions No flowsheet data found.

## 2019-11-11 NOTE — Progress Notes (Signed)
Carelink Summary Report / Loop Recorder 

## 2019-11-11 NOTE — Telephone Encounter (Signed)
Patient called and wants to get a note saying for her to be excused for jury duty. Patients husband will pick up.

## 2019-11-11 NOTE — Progress Notes (Signed)
Discharge instructions and JP drain education provided to patient and patient's husband at bedside. Patient and spouse verbalized understanding. No further questions or concerns at this time.   Thresa Ross, RN

## 2019-11-11 NOTE — Progress Notes (Signed)
Physical Therapy Treatment Patient Details Name: Ann [name] MRN: [id number] DOB: 1955/07/23 Today's Date: 11/11/2019    History of Present Illness admitted for acute hospitalization s/p R thoracotomy and R UL lobectomy secondary to R lung mass (11/04/19)    PT Comments    Pt was supine in bed upon arriving. She is A & O x 4 and very cooperative and pleasant. Easily able to exit R side of bed with supervision only. Stood to [employer] and ambulated >300 ft with CGA at first progressing to supervision only. Pt's supportive spouse present throughout session and will be assisting pt at home 24 hours a day. Pt will benefit from continued skilled PT at DC to continue progressing strength,endurance, and safety with mobility. Recommend HHPT to follow. At conclusion of session, pt was sitting EOB with spouse at bedside assisting pt with dressing.     Follow Up Recommendations  Home health PT     Equipment Recommendations  None recommended by PT (pt has recieved needed equipment to room)    Recommendations for Other Services       Precautions / Restrictions Precautions Precautions: Fall Restrictions Weight Bearing Restrictions: No    Mobility  Bed Mobility Overal bed mobility: Needs Assistance Bed Mobility: Supine to Sit     Supine to sit: Supervision     General bed mobility comments: pt was easily able to exit bed without physical assistance  Transfers Overall transfer level: Needs assistance Equipment used: Rolling walker (2 wheeled) Transfers: Sit to/from Stand Sit to Stand: Supervision         General transfer comment: supervision for safety. no cues required. increased time to perform 2/2 to discomfort/pain  Ambulation/Gait Ambulation/Gait assistance: Min guard;Supervision Gait Distance (Feet): 300 Feet Assistive device: Rolling walker (2 wheeled) Gait Pattern/deviations: WFL(Within Functional Limits) Gait velocity: decreased   General Gait Details: pt was able to  ambulate > 300 ft with RW withjout LOB or unsteadiness. Needs two standing rest but overall tolerated ambulation well.      Balance Overall balance assessment: Needs assistance Sitting-balance support: No upper extremity supported;Feet supported Sitting balance-Leahy Scale: Good     Standing balance support: Bilateral upper extremity supported Standing balance-Leahy Scale: Good Standing balance comment: pt demonstrated good standing balance. was able to static stand without UE support. did require AD during ambulation however no unsteadiness noted       Cognition Arousal/Alertness: Awake/alert Behavior During Therapy: WFL for tasks assessed/performed Overall Cognitive Status: Within Functional Limits for tasks assessed      General Comments: Pt is A and O x 4             Pertinent Vitals/Pain Pain Assessment: 0-10 Pain Score: 2  Faces Pain Scale: Hurts a little bit Pain Location: R lateral chest, back Pain Descriptors / Indicators: Sore;Pressure;Discomfort Pain Intervention(s): Limited activity within patient's tolerance;Monitored during session;Premedicated before session;Repositioned           PT Goals (current goals can now be found in the care plan section) Acute Rehab PT Goals Patient Stated Goal: Do whatever I need to do to get better." Progress towards PT goals: Progressing toward goals    Frequency    Min 2X/week      PT Plan Current plan remains appropriate       AM-PAC PT "6 Clicks" Mobility   Outcome Measure  Help needed turning from your back to your side while in a flat bed without using bedrails?: None Help needed moving from lying on your  back to sitting on the side of a flat bed without using bedrails?: None Help needed moving to and from a bed to a chair (including a wheelchair)?: A Little Help needed standing up from a chair using your arms (e.g., wheelchair or bedside chair)?: A Little Help needed to walk in hospital room?: A  Little Help needed climbing 3-5 steps with a railing? : A Little 6 Click Score: 20    End of Session Equipment Utilized During Treatment: Gait belt Activity Tolerance: Patient tolerated treatment well Patient left: in chair;with call bell/phone within reach;with chair alarm set;with family/visitor present Nurse Communication: Mobility status PT Visit Diagnosis: Muscle weakness (generalized) (M62.81);Difficulty in walking, not elsewhere classified (R26.2)     Time: 9483-4758 PT Time Calculation (min) (ACUTE ONLY): 17 min  Charges:  $Gait Training: 8-22 mins                     Julaine Fusi PTA 11/11/19, 10:42 AM

## 2019-11-13 ENCOUNTER — Inpatient Hospital Stay: Payer: Medicare Other

## 2019-11-13 ENCOUNTER — Emergency Department: Payer: Medicare Other

## 2019-11-13 ENCOUNTER — Other Ambulatory Visit: Payer: TRICARE For Life (TFL)

## 2019-11-13 ENCOUNTER — Other Ambulatory Visit: Payer: Self-pay

## 2019-11-13 ENCOUNTER — Inpatient Hospital Stay
Admission: EM | Admit: 2019-11-13 | Discharge: 2019-11-14 | DRG: 187 | Disposition: A | Discharge status: Home / Self Care | Payer: Medicare Other | Attending: Cardiothoracic Surgery | Admitting: Cardiothoracic Surgery

## 2019-11-13 DIAGNOSIS — R0600 Dyspnea, unspecified: Secondary | ICD-10-CM | POA: Diagnosis not present

## 2019-11-13 DIAGNOSIS — S2231XA Fracture of one rib, right side, initial encounter for closed fracture: Secondary | ICD-10-CM | POA: Diagnosis present

## 2019-11-13 DIAGNOSIS — X58XXXA Exposure to other specified factors, initial encounter: Secondary | ICD-10-CM | POA: Diagnosis present

## 2019-11-13 DIAGNOSIS — E785 Hyperlipidemia, unspecified: Secondary | ICD-10-CM | POA: Diagnosis present

## 2019-11-13 DIAGNOSIS — Z87891 Personal history of nicotine dependence: Secondary | ICD-10-CM | POA: Diagnosis not present

## 2019-11-13 DIAGNOSIS — J449 Chronic obstructive pulmonary disease, unspecified: Secondary | ICD-10-CM | POA: Diagnosis present

## 2019-11-13 DIAGNOSIS — Z8673 Personal history of transient ischemic attack (TIA), and cerebral infarction without residual deficits: Secondary | ICD-10-CM | POA: Diagnosis not present

## 2019-11-13 DIAGNOSIS — Z833 Family history of diabetes mellitus: Secondary | ICD-10-CM | POA: Diagnosis not present

## 2019-11-13 DIAGNOSIS — Z9889 Other specified postprocedural states: Secondary | ICD-10-CM

## 2019-11-13 DIAGNOSIS — K5900 Constipation, unspecified: Secondary | ICD-10-CM | POA: Diagnosis present

## 2019-11-13 DIAGNOSIS — Z8249 Family history of ischemic heart disease and other diseases of the circulatory system: Secondary | ICD-10-CM | POA: Diagnosis not present

## 2019-11-13 DIAGNOSIS — Z79899 Other long term (current) drug therapy: Secondary | ICD-10-CM

## 2019-11-13 DIAGNOSIS — Z820 Family history of epilepsy and other diseases of the nervous system: Secondary | ICD-10-CM | POA: Diagnosis not present

## 2019-11-13 DIAGNOSIS — I252 Old myocardial infarction: Secondary | ICD-10-CM

## 2019-11-13 DIAGNOSIS — I6522 Occlusion and stenosis of left carotid artery: Secondary | ICD-10-CM | POA: Diagnosis present

## 2019-11-13 DIAGNOSIS — Z7951 Long term (current) use of inhaled steroids: Secondary | ICD-10-CM

## 2019-11-13 DIAGNOSIS — I2510 Atherosclerotic heart disease of native coronary artery without angina pectoris: Secondary | ICD-10-CM | POA: Diagnosis present

## 2019-11-13 DIAGNOSIS — C3411 Malignant neoplasm of upper lobe, right bronchus or lung: Secondary | ICD-10-CM | POA: Diagnosis present

## 2019-11-13 DIAGNOSIS — Z955 Presence of coronary angioplasty implant and graft: Secondary | ICD-10-CM

## 2019-11-13 DIAGNOSIS — I10 Essential (primary) hypertension: Secondary | ICD-10-CM | POA: Diagnosis present

## 2019-11-13 DIAGNOSIS — Z902 Acquired absence of lung [part of]: Secondary | ICD-10-CM

## 2019-11-13 DIAGNOSIS — J90 Pleural effusion, not elsewhere classified: Principal | ICD-10-CM | POA: Diagnosis present

## 2019-11-13 DIAGNOSIS — R0603 Acute respiratory distress: Secondary | ICD-10-CM | POA: Diagnosis present

## 2019-11-13 DIAGNOSIS — Y838 Other surgical procedures as the cause of abnormal reaction of the patient, or of later complication, without mention of misadventure at the time of the procedure: Secondary | ICD-10-CM | POA: Diagnosis present

## 2019-11-13 DIAGNOSIS — D75838 Other thrombocytosis: Secondary | ICD-10-CM | POA: Diagnosis present

## 2019-11-13 DIAGNOSIS — Z888 Allergy status to other drugs, medicaments and biological substances status: Secondary | ICD-10-CM

## 2019-11-13 DIAGNOSIS — Z981 Arthrodesis status: Secondary | ICD-10-CM

## 2019-11-13 DIAGNOSIS — J95811 Postprocedural pneumothorax: Secondary | ICD-10-CM | POA: Diagnosis present

## 2019-11-13 DIAGNOSIS — J9811 Atelectasis: Secondary | ICD-10-CM | POA: Diagnosis present

## 2019-11-13 DIAGNOSIS — Z20822 Contact with and (suspected) exposure to covid-19: Secondary | ICD-10-CM | POA: Diagnosis present

## 2019-11-13 DIAGNOSIS — I998 Other disorder of circulatory system: Secondary | ICD-10-CM | POA: Diagnosis present

## 2019-11-13 DIAGNOSIS — Z7982 Long term (current) use of aspirin: Secondary | ICD-10-CM

## 2019-11-13 LAB — COMPREHENSIVE METABOLIC PANEL
ALT: 31 U/L (ref 0–44)
AST: 36 U/L (ref 15–41)
Albumin: 3.4 g/dL — ABNORMAL LOW (ref 3.5–5.0)
Alkaline Phosphatase: 74 U/L (ref 38–126)
Anion gap: 10 (ref 5–15)
BUN: 10 mg/dL (ref 8–23)
CO2: 25 mmol/L (ref 22–32)
Calcium: 8.6 mg/dL — ABNORMAL LOW (ref 8.9–10.3)
Chloride: 106 mmol/L (ref 98–111)
Creatinine, Ser: 0.53 mg/dL (ref 0.44–1.00)
GFR, Estimated: >60 mL/min (ref >60)
Glucose, Bld: 100 mg/dL — ABNORMAL HIGH (ref 70–99)
Potassium: 3.3 mmol/L — ABNORMAL LOW (ref 3.5–5.1)
Sodium: 141 mmol/L (ref 135–145)
Total Bilirubin: 0.5 mg/dL (ref 0.3–1.2)
Total Protein: 7.1 g/dL (ref 6.5–8.1)

## 2019-11-13 LAB — BODY FLUID CELL COUNT WITH DIFFERENTIAL
Eos, Fluid: 6 %
Lymphs, Fluid: 70 %
Monocyte-Macrophage-Serous Fluid: 14 %
Neutrophil Count, Fluid: 10 %
Total Nucleated Cell Count, Fluid: 2756 uL

## 2019-11-13 LAB — CBC WITH DIFFERENTIAL/PLATELET
Abs Immature Granulocytes: 0.05 10*3/uL (ref 0.00–0.07)
Basophils Absolute: 0.1 10*3/uL (ref 0.0–0.1)
Basophils Relative: 1 %
Eosinophils Absolute: 0.2 10*3/uL (ref 0.0–0.5)
Eosinophils Relative: 2 %
HCT: 39.3 % (ref 36.0–46.0)
Hemoglobin: 12.7 g/dL (ref 12.0–15.0)
Immature Granulocytes: 1 %
Lymphocytes Relative: 33 %
Lymphs Abs: 3 10*3/uL (ref 0.7–4.0)
MCH: 32.2 pg (ref 26.0–34.0)
MCHC: 32.3 g/dL (ref 30.0–36.0)
MCV: 99.7 fL (ref 80.0–100.0)
Monocytes Absolute: 0.6 10*3/uL (ref 0.1–1.0)
Monocytes Relative: 7 %
Neutro Abs: 5.2 10*3/uL (ref 1.7–7.7)
Neutrophils Relative %: 56 %
Platelets: 483 10*3/uL — ABNORMAL HIGH (ref 150–400)
RBC: 3.94 MIL/uL (ref 3.87–5.11)
RDW: 14.8 % (ref 11.5–15.5)
WBC: 9.2 10*3/uL (ref 4.0–10.5)
nRBC: 0 % (ref 0.0–0.2)

## 2019-11-13 LAB — RESPIRATORY PANEL BY RT PCR (FLU A&B, COVID)
Influenza A by PCR: NEGATIVE
Influenza B by PCR: NEGATIVE
SARS Coronavirus 2 by RT PCR: NEGATIVE

## 2019-11-13 LAB — GLUCOSE, PLEURAL OR PERITONEAL FLUID: Glucose, Fluid: 98 mg/dL

## 2019-11-13 LAB — FERRITIN: Ferritin: 212 ng/mL (ref 11–307)

## 2019-11-13 LAB — TROPONIN I (HIGH SENSITIVITY)
Troponin I (High Sensitivity): 17 ng/L (ref <18)
Troponin I (High Sensitivity): 15 ng/L (ref <18)

## 2019-11-13 LAB — BRAIN NATRIURETIC PEPTIDE: B Natriuretic Peptide: 226.4 pg/mL — ABNORMAL HIGH (ref 0.0–100.0)

## 2019-11-13 LAB — HIV ANTIBODY (ROUTINE TESTING W REFLEX): HIV Screen 4th Generation wRfx: NONREACTIVE

## 2019-11-13 MED ORDER — FUROSEMIDE 20 MG PO TABS: 20.0000 mg | ORAL_TABLET | Freq: Every day | ORAL | Status: DC | PRN

## 2019-11-13 MED ORDER — MORPHINE SULFATE (PF) 2 MG/ML IV SOLN
1.0000 mg | INTRAVENOUS | Status: DC | PRN
  Administered 2019-11-13 – 2019-11-14 (×3): 1 mg via INTRAVENOUS
  Filled 2019-11-13 (×3): qty 1

## 2019-11-13 MED ORDER — SODIUM CHLORIDE 0.9 % IV SOLN: INTRAVENOUS | Status: DC

## 2019-11-13 MED ORDER — POTASSIUM CHLORIDE 10 MEQ/100ML IV SOLN
10.0000 meq | INTRAVENOUS | Status: AC
  Administered 2019-11-13 (×4): 10 meq via INTRAVENOUS
  Filled 2019-11-13 (×4): qty 100

## 2019-11-13 MED ORDER — UMECLIDINIUM BROMIDE 62.5 MCG/INH IN AEPB
1.0000 | INHALATION_SPRAY | Freq: Every day | RESPIRATORY_TRACT | Status: DC
  Administered 2019-11-14: 1 via RESPIRATORY_TRACT
  Filled 2019-11-13: qty 7

## 2019-11-13 MED ORDER — OXYCODONE HCL 5 MG PO TABS: 5.0000 mg | ORAL_TABLET | Freq: Four times a day (QID) | ORAL | Status: DC | PRN

## 2019-11-13 MED ORDER — FLUTICASONE PROPIONATE 50 MCG/ACT NA SUSP
2.0000 | Freq: Every day | NASAL | Status: DC | PRN
  Filled 2019-11-13: qty 16

## 2019-11-13 MED ORDER — FLUTICASONE FUROATE-VILANTEROL 100-25 MCG/INH IN AEPB
1.0000 | INHALATION_SPRAY | Freq: Every day | RESPIRATORY_TRACT | Status: DC
  Administered 2019-11-13 – 2019-11-14 (×2): 1 via RESPIRATORY_TRACT
  Filled 2019-11-13: qty 28

## 2019-11-13 MED ORDER — IOHEXOL 350 MG/ML SOLN
75.0000 mL | Freq: Once | INTRAVENOUS | Status: AC | PRN
  Administered 2019-11-13: 75 mL via INTRAVENOUS

## 2019-11-13 MED ORDER — PANTOPRAZOLE SODIUM 40 MG IV SOLR
40.0000 mg | Freq: Every day | INTRAVENOUS | Status: DC
  Administered 2019-11-13: 40 mg via INTRAVENOUS
  Filled 2019-11-13: qty 40

## 2019-11-13 MED ORDER — METOPROLOL TARTRATE 25 MG PO TABS
25.0000 mg | ORAL_TABLET | Freq: Two times a day (BID) | ORAL | Status: DC
  Administered 2019-11-13 – 2019-11-14 (×3): 25 mg via ORAL
  Filled 2019-11-13 (×3): qty 1

## 2019-11-13 MED ORDER — OXYCODONE-ACETAMINOPHEN 5-325 MG PO TABS
1.0000 | ORAL_TABLET | ORAL | Status: DC | PRN
  Administered 2019-11-13: 2 via ORAL
  Filled 2019-11-13: qty 2

## 2019-11-13 MED ORDER — HEPARIN SODIUM (PORCINE) 5000 UNIT/ML IJ SOLN
5000.0000 [IU] | Freq: Three times a day (TID) | INTRAMUSCULAR | Status: DC
  Administered 2019-11-13 – 2019-11-14 (×4): 5000 [IU] via SUBCUTANEOUS
  Filled 2019-11-13 (×4): qty 1

## 2019-11-13 MED ORDER — FLUTICASONE-UMECLIDIN-VILANT 100-62.5-25 MCG/INH IN AEPB: 1.0000 | INHALATION_SPRAY | Freq: Every day | RESPIRATORY_TRACT | Status: DC

## 2019-11-13 MED ORDER — SPIRONOLACTONE 25 MG PO TABS
25.0000 mg | ORAL_TABLET | Freq: Every day | ORAL | Status: DC
  Administered 2019-11-13 – 2019-11-14 (×2): 25 mg via ORAL
  Filled 2019-11-13 (×2): qty 1

## 2019-11-13 MED ORDER — ROSUVASTATIN CALCIUM 10 MG PO TABS
20.0000 mg | ORAL_TABLET | Freq: Every day | ORAL | Status: DC
  Administered 2019-11-13 – 2019-11-14 (×2): 20 mg via ORAL
  Filled 2019-11-13: qty 1
  Filled 2019-11-13: qty 2

## 2019-11-13 MED ORDER — FUROSEMIDE 10 MG/ML IJ SOLN
40.0000 mg | Freq: Every day | INTRAMUSCULAR | Status: DC
  Administered 2019-11-13 – 2019-11-14 (×2): 40 mg via INTRAVENOUS
  Filled 2019-11-13 (×2): qty 4

## 2019-11-13 MED ORDER — METHOCARBAMOL 500 MG PO TABS
500.0000 mg | ORAL_TABLET | Freq: Three times a day (TID) | ORAL | Status: DC | PRN
  Administered 2019-11-13: 500 mg via ORAL
  Filled 2019-11-13 (×2): qty 1

## 2019-11-13 MED ORDER — IBUPROFEN 600 MG PO TABS
600.0000 mg | ORAL_TABLET | Freq: Four times a day (QID) | ORAL | Status: DC | PRN
  Administered 2019-11-13 – 2019-11-14 (×3): 600 mg via ORAL
  Filled 2019-11-13 (×2): qty 1
  Filled 2019-11-13: qty 2
  Filled 2019-11-13 (×3): qty 1

## 2019-11-13 NOTE — ED Notes (Signed)
Patient asking for pain medications, Dr. Genevive Bi notified, waiting for response

## 2019-11-13 NOTE — ED Notes (Signed)
Pt assisted to toilet in room at this time

## 2019-11-13 NOTE — Progress Notes (Signed)
Pt known to our service, recently discharged with re- accumulation of moderate pleural effusion and increased work of breathing.   To be admitted for O2 support, observation, and potential replacement of drain.  No marked distress with adequate sats at this time per report.

## 2019-11-13 NOTE — Clinical Social Work Note (Signed)
CSW acknowledges consult for home health/DME needs. Patient discharged home 11/9 and was set up with [facility] for PT and nursing. A walker was delivered to her room before she discharged. CSW will continue to follow for discharge needs.  Ann [name], [place]

## 2019-11-13 NOTE — Consult Note (Addendum)
Pulmonary Medicine          Date: 11/13/2019,   MRN# [id number] Ann [name] [birth year]     AdmissionWeight: 48.9 kg                 CurrentWeight: 48.9 kg  Referring physician: Dr. Genevive Bi    CHIEF COMPLAINT:   Chest discomfort and respiratory distress   HISTORY OF PRESENT ILLNESS   This pleasant 64 year old female who was seen by me in pulmonary clinic in October due to dyspnea and findings of lung nodule of the right upper lobe.  Patient was seen over last few months with evaluation of chest imaging via thoracic navigator subsequently required thoracic surgery evaluation with plan for surgery.  Previous to this she had episodes of syncope and was seen by cardiology with placement of a loop recorder and additional work-up for arrhythmias with EP.  She has a history of lifelong smoking and was actively smoking as of last month when I saw her.  When we met we reviewed her nodule which was about 1.2 cm and was mildly hypermetabolic at 2.6 SUVs on most recent PET scan.  She had a full pulmonary function test done which showed that her FEV1 is at approximately 1.5 L which is 83% of predicted.  Overall she is fully functional and had dyspnea at baseline only after prolonged walking.  Additionally due to COPD she has chronic phlegm production with cough.  Patient underwent right thoracotomy with right upper lobe lobectomy on November 3.  Postoperatively she did well developed transient ileus which resolved spontaneously was able to advance her diet and was subsequently discharged home with JP drain from right chest.  She came into emergency room overnight and complained of worsening dyspnea x1d.  Work-up in the ER shows blood work which is essentially normal with absence of leukocytosis or anemia and mild thrombocytosis which is likely reactive post surgery.  CMP is with borderline low potassium only otherwise within reference range.  BNP is slightly elevated over 200 and patient does  have significant cardiac history, however troponins x2 in the ER overnight have been normal.  She had CT angio done there is finding of 5th R rib fracture acute, small right pneumothorax, moderate right pleural effusion. Patient states she is constipated and belly is distended causing pain and hindering breathing. She is able to take tidal volume on incentive spirometer >1074m during examination today.   PAST MEDICAL HISTORY   Past Medical History:  Diagnosis Date  . [place]   . Coronary artery disease    Prior stenting  . Hypertension Hypoxic  . Hypokalemia   . Multiple thyroid nodules   . [place]   . [facility]   TIA  . Syncope      SURGICAL HISTORY   Past Surgical History:  Procedure Laterality Date  . BACK SURGERY     DECOMRESSION L4-5 WITH RODS AND FUSION  . CAROTID STENT  07/18/2003  . NECK SURGERY    . THORACOTOMY/LOBECTOMY Right 11/04/2019   Procedure: THORACOTOMY/LOBECTOMY;  Surgeon: ONestor Lewandowsky MD;  Location: [facility] ORS;  Service: Thoracic;  Laterality: Right;  . TONSILLECTOMY    . TUBAL LIGATION       FAMILY HISTORY   Family History  Problem Relation Age of Onset  . Alzheimer's disease Mother   . Heart attack Father   . Diabetes Mellitus II Brother   . Diabetes Mellitus II Brother  SOCIAL HISTORY   Social History   Tobacco Use  . Smoking status: Former Smoker    Packs/day: 0.25    Years: 49.00    Pack years: 12.25    Types: Cigarettes    Quit date: 10/17/2019    Years since quitting: 0.0  . Smokeless tobacco: Never Used  . Tobacco comment: 1 pack lasts 3-4 days  Vaping Use  . Vaping Use: Never used  Substance Use Topics  . Alcohol use: Yes    Comment: 1 shot of vodka with lemonade every evening  . Drug use: Never     MEDICATIONS    Home Medication:  Current Outpatient Rx  . Order #: [id number] Class: Historical Med  . Order #: [id number] Class: Historical Med  .  Order #: [id number] Class: Historical Med  . Order #: [id number] Class: Normal  . Order #: [id number] Class: Normal  . Order #: [id number] Class: Historical Med  . Order #: [id number] Class: Normal  . Order #: [id number] Class: Historical Med  . Order #: [id number] Class: Historical Med  . Order #: [id number] Class: Historical Med    Current Medication: No current facility-administered medications for this encounter.  Current Outpatient Medications:  .  aspirin 325 MG tablet, Take 325 mg by mouth daily. , Disp: , Rfl:  .  fluticasone (FLONASE) 50 MCG/ACT nasal spray, Place 2 sprays into both nostrils daily as needed for allergies. , Disp: , Rfl:  .  furosemide (LASIX) 20 MG tablet, Take 20 mg by mouth daily as needed for edema. , Disp: , Rfl:  .  ibuprofen (ADVIL) 600 MG tablet, Take 1 tablet (600 mg total) by mouth every 6 (six) hours as needed., Disp: 30 tablet, Rfl: 0 .  methocarbamol (ROBAXIN) 500 MG tablet, Take 1 tablet (500 mg total) by mouth every 8 (eight) hours as needed for muscle spasms., Disp: 30 tablet, Rfl: 0 .  metoprolol tartrate (LOPRESSOR) 25 MG tablet, Take 25 mg by mouth 2 (two) times daily., Disp: , Rfl:  .  oxyCODONE (OXY IR/ROXICODONE) 5 MG immediate release tablet, Take 1 tablet (5 mg total) by mouth every 6 (six) hours as needed for severe pain or breakthrough pain., Disp: 20 tablet, Rfl: 0 .  spironolactone (ALDACTONE) 25 MG tablet, Take 25 mg by mouth daily. , Disp: , Rfl:  .  TRELEGY ELLIPTA 100-62.5-25 MCG/INH AEPB, Inhale 1 puff into the lungs daily., Disp: , Rfl:  .  rosuvastatin (CRESTOR) 20 MG tablet, Take 20 mg by mouth daily.  (Patient not taking: Reported on 11/13/2019), Disp: , Rfl:     ALLERGIES   Clopidogrel     REVIEW OF SYSTEMS    Review of Systems:  Gen:  Denies  fever, sweats, chills weigh loss  HEENT: Denies blurred vision, double vision, ear pain, eye pain, hearing loss, nose bleeds, sore throat Cardiac:  No dizziness, chest pain or heaviness,  chest tightness,edema Resp:   Denies cough or sputum porduction, shortness of breath,wheezing, hemoptysis,  Gi: Denies swallowing difficulty, stomach pain, nausea or vomiting, diarrhea, constipation, bowel incontinence Gu:  Denies bladder incontinence, burning urine Ext:   Denies Joint pain, stiffness or swelling Skin: Denies  skin rash, easy bruising or bleeding or hives Endoc:  Denies polyuria, polydipsia , polyphagia or weight change Psych:   Denies depression, insomnia or hallucinations   Other:  All other systems negative   VS: BP (!) 146/94   Pulse 76   Temp 98.7 F (37.1 C) (Oral)   Resp 20   Ht _0  (1.575 m)  Wt 48.9 kg   SpO2 97%   BMI 19.72 kg/m      PHYSICAL EXAM    GENERAL:NAD, no fevers, chills, no weakness no fatigue HEAD: Normocephalic, atraumatic.  EYES: Pupils equal, round, reactive to light. Extraocular muscles intact. No scleral icterus.  MOUTH: Moist mucosal membrane. Dentition intact. No abscess noted.  EAR, NOSE, THROAT: Clear without exudates. No external lesions.  NECK: Supple. No thyromegaly. No nodules. No JVD.  PULMONARY: clear to auscultation bilaterally  CARDIOVASCULAR: S1 and S2. Regular rate and rhythm. No murmurs, rubs, or gallops. No edema. Pedal pulses 2+ bilaterally.  GASTROINTESTINAL: Soft, nontender, nondistended. No masses. Positive bowel sounds. No hepatosplenomegaly.  MUSCULOSKELETAL: No swelling, clubbing, or edema. Range of motion full in all extremities.  NEUROLOGIC: Cranial nerves II through XII are intact. No gross focal neurological deficits. Sensation intact. Reflexes intact.  SKIN: No ulceration, lesions, rashes, or cyanosis. Skin warm and dry. Turgor intact.  PSYCHIATRIC: Mood, affect within normal limits. The patient is awake, alert and oriented x 3. Insight, judgment intact.       IMAGING    DG Chest 2 View  Result Date: 11/08/2019 CLINICAL DATA:  Chest tube in place EXAM: CHEST - 2 VIEW COMPARISON:  11/06/2019  chest radiograph. FINDINGS: Loop recorder overlies the left heart. Partially visualized surgical hardware from ACDF. Apical and basilar right chest tubes are stable in position. Skin staples overlie the lower outer right chest. Stable cardiomediastinal silhouette with top-normal heart size. No pneumothorax. No pleural effusion. No pulmonary edema. Mild left basilar atelectasis, improved. IMPRESSION: 1. No pneumothorax. Stable right chest tubes. 2. Mild left basilar atelectasis, improved. Electronically Signed   By: Ilona Sorrel M.D.   On: 11/08/2019 09:04   Chest 2 View  Result Date: 11/02/2019 CLINICAL DATA:  Pre op images, surgery on Nov 2nd. COPD, hypertension, myocardial infarction. Former smoker EXAM: CHEST - 2 VIEW COMPARISON:  Chest CT, 09/23/2019 FINDINGS: Heart is mildly enlarged. Left coronary artery stent. No mediastinal or hilar masses. No evidence of adenopathy. Lungs are hyperexpanded, but clear. No pleural effusion or pneumothorax. Skeletal structures are intact. IMPRESSION: No active cardiopulmonary disease. Electronically Signed   By: Lajean Manes M.D.   On: 11/02/2019 04:02   CT Angio Chest PE W and/or Wo Contrast  Result Date: 11/13/2019 CLINICAL DATA:  Recent biopsy. EXAM: CT ANGIOGRAPHY CHEST WITH CONTRAST TECHNIQUE: Multidetector CT imaging of the chest was performed using the standard protocol during bolus administration of intravenous contrast. Multiplanar CT image reconstructions and MIPs were obtained to evaluate the vascular anatomy. CONTRAST:  55m OMNIPAQUE IOHEXOL 350 MG/ML SOLN COMPARISON:  CT chest dated 09/23/2019.  PET-CT dated 10/02/2019 FINDINGS: Cardiovascular: Contrast injection is sufficient to demonstrate satisfactory opacification of the pulmonary arteries to the segmental level. There is no pulmonary embolus or evidence of right heart strain. The size of the main pulmonary artery is normal. Cardiomegaly with coronary artery calcification. The course and caliber of  the aorta are normal. There is mild atherosclerotic calcification. Opacification decreased due to pulmonary arterial phase contrast bolus timing. 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 Mediastinum/Nodes: -- No mediastinal lymphadenopathy. -- No hilar lymphadenopathy. -- No axillary lymphadenopathy. -- No supraclavicular lymphadenopathy. --thyroid gland is enlarged with multiple thyroid nodules. This was previously evaluated by ultrasound. -  Unremarkable esophagus. Lungs/Pleura: The patient has undergone interval right upper lobe lobectomy. There is a trace right-sided pneumothorax. There is a moderate right-sided pleural effusion. Emphysematous changes are noted bilaterally. There is atelectasis  at the lung bases. There is a drainage catheter in the patient's right flank. Upper Abdomen: Contrast bolus timing is not optimized for evaluation of the abdominal organs. The visualized portions of the organs of the upper abdomen are normal. Musculoskeletal: There is an acute, minimally displaced fracture involving the anterolateral fifth rib on the right. No other additional acute displaced fracture identified on this study. There are postsurgical changes along the patient's right flank with a few pockets of subcutaneous gas and associated soft tissue edema. Review of the MIP images confirms the above findings. IMPRESSION: 1. No evidence of acute pulmonary embolus. 2. Status post right upper lobe lobectomy. 3. Trace right-sided pneumothorax. There is a drainage catheter situated in the soft tissues of the right flank. It is not clear if this catheter is well positioned or represents a malpositioned right-sided chest tube. Correlation with the patient's surgical history is recommended. None of this tube is currently located within the thoracic cavity. 4. Moderate right-sided pleural effusion. 5. Bibasilar atelectasis. 6. Cardiomegaly with coronary artery disease. Aortic Atherosclerosis  (ICD10-I70.0) and Emphysema (ICD10-J43.9). Electronically Signed   By: Constance Holster M.D.   On: 11/13/2019 03:48   DG Chest Port 1 View  Result Date: 11/13/2019 CLINICAL DATA:  Dyspnea EXAM: PORTABLE CHEST 1 VIEW COMPARISON:  11/09/2019, CT 09/23/2019 FINDINGS: Lung volumes are small. Surgical staple line noted at the right lung base with associated right-sided volume loss in keeping with partial right lung resection. There is a nodular pleural based density noted just superior to the staple line, similar to that noted on prior examination, which may represent a a laterally loculated pleural effusion or fluid within the fissure given its relatively rapid development. The lungs are otherwise clear. No pneumothorax. No pleural effusion on the left. Cardiac size within normal limits. Implanted loop recorder again noted. Surgical drain noted within the right breast with associated surgical skin staples. No acute bone abnormality. IMPRESSION: Stable examination. Pleural based opacity likely represents small loculated pleural fluid. No acute disease identified. Electronically Signed   By: Fidela Salisbury MD   On: 11/13/2019 02:20   DG Chest Port 1 View  Result Date: 11/09/2019 CLINICAL DATA:  RIGHT chest tube removal EXAM: PORTABLE CHEST 1 VIEW COMPARISON:  Portable exam 1535 hours compared to 0706 hours FINDINGS: Interval removal of thoracostomy tube. Additional surgical drain projects over the lower lateral RIGHT chest wall. Loop recorder projects over LEFT heart. Upper normal size of cardiac silhouette. Mediastinal contours and pulmonary vascularity normal. Atherosclerotic calcification aorta. Postsurgical changes RIGHT mid lung with subsegmental atelectasis at LEFT base. Small RIGHT apical pneumothorax post thoracostomy tube removal. No LEFT pneumothorax. Fracture of lateral RIGHT fifth rib. IMPRESSION: Small RIGHT apex pneumothorax following thoracostomy tube removal. Subsegmental atelectasis LEFT base.  Fracture of lateral RIGHT fifth rib. Findings called to [place] [facility] [place] on 11/07/201 at 1840 hrs. Findings called to Dr. Dahlia Byes on 11/09/2019 at [place] hrs. Electronically Signed   By: Lavonia Dana M.D.   On: 11/09/2019 19:09   DG CHEST PORT 1 VIEW  Result Date: 11/09/2019 CLINICAL DATA:  Status post right lobectomy, 1 right chest tube removed EXAM: PORTABLE CHEST 1 VIEW COMPARISON:  None. FINDINGS: The previous right chest tube extending to the apex has been removed. Stable right lower chest surgical drain and residual basilar chest tube. Stable aeration with postoperative changes noted and right hemithorax volume loss. No developing effusion or significant pneumothorax. Stable heart size and vascularity. Stable left lung aeration. Loop recorder device over  the left cardiac border. Trachea midline. Aorta atherosclerotic. Similar gaseous distention of the bowel in the epigastric region. IMPRESSION: Stable postoperative findings. Removal of 1 of the right chest tubes. No effusion or pneumothorax. Electronically Signed   By: Jerilynn Mages.  Shick M.D.   On: 11/09/2019 14:54   DG Chest Port 1 View  Result Date: 11/06/2019 CLINICAL DATA:  Postop check. EXAM: PORTABLE CHEST 1 VIEW COMPARISON:  11/04/2019. FINDINGS: Two right chest tubes in stable position. No pneumothorax identified. Cardiac monitor device in stable position. Stable cardiomegaly. Stable left base atelectasis. Postsurgical changes right lung. Mild right chest wall subcutaneous emphysema again noted. Prior cervical fusion. IMPRESSION: 1. Two right chest tubes in stable position. No pneumothorax identified. Stable right chest wall mild subcutaneous emphysema. 2. Stable left base atelectasis. Stable postsurgical changes right chest. 3. Stable cardiomegaly. Electronically Signed   By: Marcello Moores  Register   On: 11/06/2019 05:32   DG Chest Port 1 View  Result Date: 11/04/2019 CLINICAL DATA:  Postoperative check EXAM: PORTABLE CHEST 1 VIEW COMPARISON:  Portable exam  1220 hours compared to 10/31/2019 FINDINGS: RIGHT thoracostomy tubes new since prior exam. Loop recorder and coronary stent project over heart. Minimal enlargement of cardiac silhouette with slight vascular congestion. Atherosclerotic calcifications aorta. Lungs clear. Postsurgical changes RIGHT hemithorax. No pleural effusion or pneumothorax. IMPRESSION: Postsurgical changes of the RIGHT hemithorax. No acute abnormalities. Aortic Atherosclerosis (ICD10-I70.0). Electronically Signed   By: Lavonia Dana M.D.   On: 11/04/2019 12:47   DG ABD ACUTE 2+V W 1V CHEST  Result Date: 11/10/2019 CLINICAL DATA:  Syncope, ileus. EXAM: DG ABDOMEN ACUTE WITH 1 VIEW CHEST COMPARISON:  November 09, 2019. FINDINGS: There is no evidence of dilated bowel loops or free intraperitoneal air. No radiopaque calculi or other significant radiographic abnormality is seen. Heart size and mediastinal contours are within normal limits. Stable small right apical pneumothorax is noted. Stable bibasilar subsegmental atelectasis is noted. IMPRESSION: Stable small right apical pneumothorax. Stable bibasilar subsegmental atelectasis. No evidence of bowel obstruction or ileus. Electronically Signed   By: Marijo Conception M.D.   On: 11/10/2019 08:07   CUP PACEART REMOTE DEVICE CHECK  Result Date: 11/10/2019 ILR summary report received. Battery status OK. Normal device function. No new symptom, tachy, brady, or pause episodes. No new AF episodes. Monthly summary reports and ROV/PRN     ASSESSMENT/PLAN   Right upper lobe lung cancer  - stage IA - s/p thoracotomy and lobectomy  - right sided drain with minimal activity   - CT surgery on case - appreciate input   Moderate pleural effusion  - likely residual post operatively with partial ex-vacuo physiology - Unable to appreciate drainage catheter in pleural space -may need manipulation/repositioning  - will stop IV fluids and initiate gentle diuresis - consider thoracentesis - will  discuss with Dr Genevive Bi - patient on room air   Right fifth acute rib fracture  - causing pain with respiratory movements - contributing to dyspnea -supportive care - analgesia - patient currently at 7/10 - will add low dose low dose morphine , patient states she does not want PO -patient does have chronic constipation - this can also be worsened with opioids and will cause worsening dyspnea.  Patient states she took double dose of oxycontin while home    Trace right pneumothorax   - supportive care   - nasal canula supplemental O2    Bilateral Basal atelectasis    - will add BPH with metaNEB bid   - IS at  bedside - 1067m vT    Left common carotid occlusion    - patient had CVA in the past   - she wishes for this to be further investigated   - will ask vascular service for consultation        Thank you for allowing me to participate in the care of this patient.     Patient/Family are satisfied with care plan and all questions have been answered.  This document was prepared using Dragon voice recognition software and may include unintentional dictation errors.     FOttie Glazier M.D.  [facility] [place]

## 2019-11-13 NOTE — Progress Notes (Signed)
Second administration of soap  suds done.  Patient had a return bowel movement without stool noted. Only noticed clear liquid coming out from rectum that looks like the enema that was given.

## 2019-11-13 NOTE — ED Notes (Signed)
Soap suds enema x1 given ~1045. Patient up to [facility] a few minutes later but no appreciable BM. Will repeat in 2hr per order.

## 2019-11-13 NOTE — Procedures (Signed)
Right thoracentesis without difficulty  Complications:  None  Blood Loss: none  See dictation in canopy pacs

## 2019-11-13 NOTE — ED Triage Notes (Addendum)
Pt to ED via EMS from home. Pt states she had biopsy done 9 days ago, pt has drain to right chest, pt was dx with lung cancer 1 month ago, not receiving any chemo/radiation at this time. Pt arrives c/o sob, new onset tonight. Pt able to speak in complete sentences. Pt denies pain states she took 1 robaxin and 2 percocet at 0000.

## 2019-11-13 NOTE — ED Provider Notes (Signed)
[facility] Emergency Department Provider Note  ____________________________________________   First MD Initiated Contact with Patient 11/13/19 [phone]     (approximate)  I have reviewed the triage vital signs and the nursing notes.   HISTORY  Chief Complaint Shortness of Breath    HPI Ann [name] is a 64 y.o. female with below list of previous medical conditions including hypertension MI CVA lung cancer status post right upper  lobectomy performed on 11/04/2019 and discharged home on 11/11/2019 presents to the emergency department secondary to progressive dyspnea since midnight tonight.  Patient denies any chest pain at present.  Patient does admit to decreased drainage from the JP drain on the right.       Past Medical History:  Diagnosis Date  . COPD (chronic obstructive pulmonary disease) ([place])   . Coronary artery disease    Prior stenting  . Hypertension Hypoxic  . Hypokalemia   . Multiple thyroid nodules   . Myocardial infarction ([place])   . Stroke [facility]) 2005   TIA  . Syncope     Patient Active Problem List   Diagnosis Date Noted  . Hypokalemia 11/05/2019  . HLD (hyperlipidemia) 11/05/2019  . COPD (chronic obstructive pulmonary disease) ([place]) 11/05/2019  . Stroke ([place]) 11/05/2019  . Leukocytosis 11/05/2019  . Lung mass 11/04/2019  . Vasovagal syncope 09/23/2018  . H/O right coronary artery stent placement 09/23/2018  . Elevated hemoglobin A1c 09/04/2018  . Tobacco use 07/14/2016  . Status post lumbar surgery 07/14/2016  . HTN, goal below 140/90 07/14/2016  . Healthcare maintenance 07/14/2016  . Coronary artery disease involving native coronary artery of native heart without angina pectoris 07/14/2016    Past Surgical History:  Procedure Laterality Date  . BACK SURGERY     DECOMRESSION L4-5 WITH RODS AND FUSION  . CAROTID STENT  07/18/2003  . NECK SURGERY    . THORACOTOMY/LOBECTOMY Right 11/04/2019   Procedure:  THORACOTOMY/LOBECTOMY;  Surgeon: Nestor Lewandowsky, MD;  Location: [facility] ORS;  Service: Thoracic;  Laterality: Right;  . TONSILLECTOMY    . TUBAL LIGATION      Prior to Admission medications   Medication Sig Start Date End Date Taking? Authorizing Provider  aspirin 325 MG tablet Take 325 mg by mouth daily.     [provider]  fluticasone (FLONASE) 50 MCG/ACT nasal spray Place 2 sprays into both nostrils daily as needed for allergies.  09/10/19   [provider]  furosemide (LASIX) 20 MG tablet Take 20 mg by mouth daily as needed for edema.  09/24/19 09/23/20  [provider]  ibuprofen (ADVIL) 600 MG tablet Take 1 tablet (600 mg total) by mouth every 6 (six) hours as needed. 11/11/19   Tylene Fantasia, PA-C  methocarbamol (ROBAXIN) 500 MG tablet Take 1 tablet (500 mg total) by mouth every 8 (eight) hours as needed for muscle spasms. 11/11/19   Tylene Fantasia, PA-C  metoprolol tartrate (LOPRESSOR) 25 MG tablet Take 25 mg by mouth 2 (two) times daily. 07/12/19   [provider]  oxyCODONE (OXY IR/ROXICODONE) 5 MG immediate release tablet Take 1 tablet (5 mg total) by mouth every 6 (six) hours as needed for severe pain or breakthrough pain. 11/11/19   Tylene Fantasia, PA-C  rosuvastatin (CRESTOR) 20 MG tablet Take 20 mg by mouth daily.  Patient not taking: Reported on 11/04/2019 09/05/19   [provider]  spironolactone (ALDACTONE) 25 MG tablet Take 25 mg by mouth daily.  09/10/19   [provider]  TRELEGY ELLIPTA 100-62.5-25 MCG/INH AEPB Inhale 1 puff into the lungs daily. 10/15/19   [provider]    Allergies Clopidogrel  Family History  Problem Relation Age of Onset  . Alzheimer's disease Mother   . Heart attack Father   . Diabetes Mellitus II Brother   . Diabetes Mellitus II Brother     Social History Social History   Tobacco Use  . Smoking status: Former Smoker    Packs/day: 0.25    Years: 49.00    Pack years: 12.25     Types: Cigarettes    Quit date: 10/17/2019    Years since quitting: 0.0  . Smokeless tobacco: Never Used  . Tobacco comment: 1 pack lasts 3-4 days  Vaping Use  . Vaping Use: Never used  Substance Use Topics  . Alcohol use: Yes    Comment: 1 shot of vodka with lemonade every evening  . Drug use: Never    Review of Systems Constitutional: No fever/chills Eyes: No visual changes. ENT: No sore throat. Cardiovascular: Denies chest pain. Respiratory: Positive for shortness of breath. Gastrointestinal: No abdominal pain.  No nausea, no vomiting.  No diarrhea.  No constipation. Genitourinary: Negative for dysuria. Musculoskeletal: Negative for neck pain.  Negative for back pain. Integumentary: Negative for rash. Neurological: Negative for headaches, focal weakness or numbness.  ____________________________________________   PHYSICAL EXAM:  VITAL SIGNS: ED Triage Vitals  Enc Vitals Group     BP 11/13/19 0158 (!) 161/90     Pulse Rate 11/13/19 0158 90     Resp 11/13/19 0158 19     Temp 11/13/19 0158 98.7 F (37.1 C)     Temp Source 11/13/19 0158 Oral     SpO2 11/13/19 0158 100 %     Weight 11/13/19 0154 48.9 kg (107 lb 12.9 oz)     Height 11/13/19 0154 1.575 m (5\' 2" )     Head Circumference --      [place] Flow --      Pain Score 11/13/19 0154 0     Pain Loc --      Pain Edu? --      Excl. in [place]? --      Constitutional: Alert and oriented.  Eyes: Conjunctivae are normal.  Head: Atraumatic. Mouth/Throat: Patient is wearing a mask. Neck: No stridor.  No meningeal signs.   Cardiovascular: Normal rate, regular rhythm. Good peripheral circulation. Grossly normal heart sounds. Respiratory: Tachypnea bibasilar rhonchi worse on the right Gastrointestinal: Soft and nontender. No distention.  Musculoskeletal: No lower extremity tenderness nor edema. No gross deformities of extremities. Neurologic:  Normal speech and language. No gross focal neurologic deficits are appreciated.    Skin:  Skin is warm, dry and intact. Psychiatric: Mood and affect are normal. Speech and behavior are normal.  ____________________________________________   LABS (all labs ordered are listed, but only abnormal results are displayed)  Labs Reviewed  CBC WITH DIFFERENTIAL/PLATELET - Abnormal; Notable for the following components:      Result Value   Platelets 483 (*)    All other components within normal limits  BRAIN NATRIURETIC PEPTIDE - Abnormal; Notable for the following components:   B Natriuretic Peptide 226.4 (*)    All other components within normal limits  COMPREHENSIVE METABOLIC PANEL - Abnormal; Notable for the following components:   Potassium 3.3 (*)    Glucose, Bld 100 (*)    Calcium 8.6 (*)    Albumin 3.4 (*)    All other components  within normal limits  TROPONIN I (HIGH SENSITIVITY)  TROPONIN I (HIGH SENSITIVITY)   ____________________________________________  EKG  ED ECG REPORT I, [place] Kennetta Pavlovic, the attending physician, personally viewed and interpreted this ECG.   Date: 11/13/2019  EKG Time: 1:59 AM  Rate: 86  Rhythm: Normal sinus rhythm  Axis: Normal  Intervals: Normal  ST&T Change: None  ____________________________________________  RADIOLOGY I, [place] Moncia Annas, personally viewed and evaluated these images (plain radiographs) as part of my medical decision making, as well as reviewing the written report by the radiologist.  ED MD interpretation: No evidence of acute pulmonary emboli on CT chest.  Right-sided pleural effusion with drain located in the right flank  Official radiology report(s): CT Angio Chest PE W and/or Wo Contrast  Result Date: 11/13/2019 CLINICAL DATA:  Recent biopsy. EXAM: CT ANGIOGRAPHY CHEST WITH CONTRAST TECHNIQUE: Multidetector CT imaging of the chest was performed using the standard protocol during bolus administration of intravenous contrast. Multiplanar CT image reconstructions and MIPs were obtained to evaluate the  vascular anatomy. CONTRAST:  37mL OMNIPAQUE IOHEXOL 350 MG/ML SOLN COMPARISON:  CT chest dated 09/23/2019.  PET-CT dated 10/02/2019 FINDINGS: Cardiovascular: Contrast injection is sufficient to demonstrate satisfactory opacification of the pulmonary arteries to the segmental level. There is no pulmonary embolus or evidence of right heart strain. The size of the main pulmonary artery is normal. Cardiomegaly with coronary artery calcification. The course and caliber of the aorta are normal. There is mild atherosclerotic calcification. Opacification decreased due to pulmonary arterial phase contrast bolus timing. 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 Mediastinum/Nodes: -- No mediastinal lymphadenopathy. -- No hilar lymphadenopathy. -- No axillary lymphadenopathy. -- No supraclavicular lymphadenopathy. --thyroid gland is enlarged with multiple thyroid nodules. This was previously evaluated by ultrasound. -  Unremarkable esophagus. Lungs/Pleura: The patient has undergone interval right upper lobe lobectomy. There is a trace right-sided pneumothorax. There is a moderate right-sided pleural effusion. Emphysematous changes are noted bilaterally. There is atelectasis at the lung bases. There is a drainage catheter in the patient's right flank. Upper Abdomen: Contrast bolus timing is not optimized for evaluation of the abdominal organs. The visualized portions of the organs of the upper abdomen are normal. Musculoskeletal: There is an acute, minimally displaced fracture involving the anterolateral fifth rib on the right. No other additional acute displaced fracture identified on this study. There are postsurgical changes along the patient's right flank with a few pockets of subcutaneous gas and associated soft tissue edema. Review of the MIP images confirms the above findings. IMPRESSION: 1. No evidence of acute pulmonary embolus. 2. Status post right upper lobe lobectomy. 3. Trace right-sided  pneumothorax. There is a drainage catheter situated in the soft tissues of the right flank. It is not clear if this catheter is well positioned or represents a malpositioned right-sided chest tube. Correlation with the patient's surgical history is recommended. None of this tube is currently located within the thoracic cavity. 4. Moderate right-sided pleural effusion. 5. Bibasilar atelectasis. 6. Cardiomegaly with coronary artery disease. Aortic Atherosclerosis (ICD10-I70.0) and Emphysema (ICD10-J43.9). Electronically Signed   By: Constance Holster M.D.   On: 11/13/2019 03:48   DG Chest Port 1 View  Result Date: 11/13/2019 CLINICAL DATA:  Dyspnea EXAM: PORTABLE CHEST 1 VIEW COMPARISON:  11/09/2019, CT 09/23/2019 FINDINGS: Lung volumes are small. Surgical staple line noted at the right lung base with associated right-sided volume loss in keeping with partial right lung resection. There is a nodular pleural based density noted  just superior to the staple line, similar to that noted on prior examination, which may represent a a laterally loculated pleural effusion or fluid within the fissure given its relatively rapid development. The lungs are otherwise clear. No pneumothorax. No pleural effusion on the left. Cardiac size within normal limits. Implanted loop recorder again noted. Surgical drain noted within the right breast with associated surgical skin staples. No acute bone abnormality. IMPRESSION: Stable examination. Pleural based opacity likely represents small loculated pleural fluid. No acute disease identified. Electronically Signed   By: Fidela Salisbury MD   On: 11/13/2019 02:20      Procedures   ____________________________________________   INITIAL IMPRESSION / MDM / [place] / ED COURSE  As part of my medical decision making, I reviewed the following data within the electronic MEDICAL RECORD NUMBER   64 year old female presented with above-stated history and physical exam a  differential diagnosis including but not limited to pleural effusion, pneumothorax, pneumonia, pulmonary emboli.  CT scan revealed evidence of pleural effusion with JP drain abdomen good position.  Patient discussed with Dr. Mil[place] Gain general surgeon on-call who admitted the patient for further management.  ____________________________________________  FINAL CLINICAL IMPRESSION(S) / ED DIAGNOSES  Final diagnoses:  Pleural effusion on right     MEDICATIONS GIVEN DURING THIS VISIT:  Medications  iohexol (OMNIPAQUE) 350 MG/ML injection 75 mL (75 mLs Intravenous Contrast Given 11/13/19 0318)     ED Discharge Orders    None      *Please note:  Ann [name] was evaluated in Emergency Department on 11/13/2019 for the symptoms described in the history of present illness. She was evaluated in the context of the global COVID-19 pandemic, which necessitated consideration that the patient might be at risk for infection with the SARS-CoV-2 virus that causes COVID-19. Institutional protocols and algorithms that pertain to the evaluation of patients at risk for COVID-19 are in a state of rapid change based on information released by regulatory bodies including the CDC and federal and state organizations. These policies and algorithms were followed during the patient's care in the ED.  Some ED evaluations and interventions may be delayed as a result of limited staffing during and after the pandemic.*  Note:  This document was prepared using Dragon voice recognition software and may include unintentional dictation errors.   Gregor Hams, MD 11/13/19 [phone]

## 2019-11-13 NOTE — ED Notes (Signed)
Patient transported to CT scan . 

## 2019-11-13 NOTE — H&P (Signed)
Patient ID: Ann [name], female   DOB: 02-Jul-1955, 64 y.o.   MRN: [id number]  Chief Complaint  Patient presents with  . Shortness of Breath    HPI Ann [name] is a 64 y.o. female.  This patient is a 64 year old female who underwent a right upper lobectomy for to stage I carcinomas of the lung proximately 10 days ago.  She was discharged home 2 days ago.  She was doing well at home without any real complaints except for some constipation.  She states that last night about midnight she began to develop significant shortness of breath and despite all of the measures that she could at home she continued to be short of breath and was transported here by EMS.  Upon arrival here she received a chest x-ray which showed a small pleural effusion.  She then underwent a CT scan which did not reveal any evidence of a pulmonary embolism but a small right-sided pleural effusion.  Since she has been here she states that she does not feel significantly improved.  She continues to complain of shortness of breath.  Clinically she appears well lying in bed.  She is wearing a mask.  She has not inspiring nasal cannula oxygen and despite this her oxygen saturations are 99%.  She has excellent air entry in both right and left sides without evidence of wheezing.  She does state that she has had some constipation at home and was starting Senokot for that.  She has been walking at home.  She does not complain of significant pain.  She states that her pain is under good control.  I have discussed her care with Dr. Claudette Stapler in pulmonary medicine and he will see the patient when she is admitted to the hospital.   Past Medical History:  Diagnosis Date  . COPD (chronic obstructive pulmonary disease) ([place])   . Coronary artery disease    Prior stenting  . Hypertension Hypoxic  . Hypokalemia   . Multiple thyroid nodules   . Myocardial infarction ([place])   . Stroke [facility]) 2005   TIA  . Syncope     Past Surgical  History:  Procedure Laterality Date  . BACK SURGERY     DECOMRESSION L4-5 WITH RODS AND FUSION  . CAROTID STENT  07/18/2003  . NECK SURGERY    . THORACOTOMY/LOBECTOMY Right 11/04/2019   Procedure: THORACOTOMY/LOBECTOMY;  Surgeon: Nestor Lewandowsky, MD;  Location: [facility] ORS;  Service: Thoracic;  Laterality: Right;  . TONSILLECTOMY    . TUBAL LIGATION      Family History  Problem Relation Age of Onset  . Alzheimer's disease Mother   . Heart attack Father   . Diabetes Mellitus II Brother   . Diabetes Mellitus II Brother     Social History Social History   Tobacco Use  . Smoking status: Former Smoker    Packs/day: 0.25    Years: 49.00    Pack years: 12.25    Types: Cigarettes    Quit date: 10/17/2019    Years since quitting: 0.0  . Smokeless tobacco: Never Used  . Tobacco comment: 1 pack lasts 3-4 days  Vaping Use  . Vaping Use: Never used  Substance Use Topics  . Alcohol use: Yes    Comment: 1 shot of vodka with lemonade every evening  . Drug use: Never    Allergies  Allergen Reactions  . Clopidogrel Anaphylaxis and Hives    No current facility-administered medications for this encounter.   Current  Outpatient Medications  Medication Sig Dispense Refill  . aspirin 325 MG tablet Take 325 mg by mouth daily.     . fluticasone (FLONASE) 50 MCG/ACT nasal spray Place 2 sprays into both nostrils daily as needed for allergies.     . furosemide (LASIX) 20 MG tablet Take 20 mg by mouth daily as needed for edema.     Marland Kitchen ibuprofen (ADVIL) 600 MG tablet Take 1 tablet (600 mg total) by mouth every 6 (six) hours as needed. 30 tablet 0  . methocarbamol (ROBAXIN) 500 MG tablet Take 1 tablet (500 mg total) by mouth every 8 (eight) hours as needed for muscle spasms. 30 tablet 0  . metoprolol tartrate (LOPRESSOR) 25 MG tablet Take 25 mg by mouth 2 (two) times daily.    Marland Kitchen oxyCODONE (OXY IR/ROXICODONE) 5 MG immediate release tablet Take 1 tablet (5 mg total) by mouth every 6 (six) hours as  needed for severe pain or breakthrough pain. 20 tablet 0  . spironolactone (ALDACTONE) 25 MG tablet Take 25 mg by mouth daily.     . TRELEGY ELLIPTA 100-62.5-25 MCG/INH AEPB Inhale 1 puff into the lungs daily.    . rosuvastatin (CRESTOR) 20 MG tablet Take 20 mg by mouth daily.  (Patient not taking: Reported on 11/13/2019)      Location, Quality, Duration, Severity, Timing, Context, Modifying Factors, Associated Signs and Symptoms.  Review of Systems A complete review of systems was asked and was negative except for the following positive findings shortness of breath  Blood pressure (!) 146/94, pulse 76, temperature 98.7 F (37.1 C), temperature source Oral, resp. rate 20, height 5\' 2"  (1.575 m), weight 48.9 kg, SpO2 97 %.  Physical Exam CONSTITUTIONAL:  Pleasant, well-developed, well-nourished, and in no acute distress. EYES: Pupils equal and reactive to light, Sclera non-icteric EARS, NOSE, MOUTH AND THROAT:  The oropharynx was clear.  Dentition is good repair.  Oral mucosa pink and moist. LYMPH NODES:  Lymph nodes in the neck and axillae were normal RESPIRATORY:  Lungs were clear on the left and perhaps slightly diminished at the right base..  Normal respiratory effort without pathologic use of accessory muscles of respiration CARDIOVASCULAR: Heart was regular without murmurs.  There were no carotid bruits. GI: The abdomen was soft, nontender, and nondistended. There were no palpable masses. There was no hepatosplenomegaly. There were normal bowel sounds in all quadrants. GU:   MUSCULOSKELETAL:  Normal muscle strength and tone.  No clubbing or cyanosis.   SKIN:  There were no pathologic skin lesions.  There were no nodules on palpation.  She does have a surgical drain placed in the subcutaneous tissues.  It has serous drainage in it.  Her thoracotomy wound is healing as expected. NEUROLOGIC:  Sensation is normal.  Cranial nerves are grossly intact. PSYCH:  Oriented to person, place and  time.  Mood and affect are normal.  Data Reviewed CT scan and chest x-ray  I have personally reviewed the patient's imaging and medical records.    Assessment    Postoperative dyspnea    Plan    We will admit the patient to the hospital.  We will continue oxygen therapy as needed.  We will ask our pulmonary medicine colleagues to see the patient in consultation.  We will encourage ambulation and physical therapy.  We will monitor the drain output to see if it can be removed while she is here in the hospital.       Nestor Lewandowsky, MD 11/13/2019, 8:32 AM

## 2019-11-13 NOTE — Progress Notes (Signed)
PT Cancellation Note  Patient Details Name: Ann [name] MRN: [id number] DOB: 1955/07/18   Cancelled Treatment:    Reason Eval/Treat Not Completed: Patient at procedure or test/unavailable (Chart reviewed, evaluation attempted. Pt going off unit for [place] study. Will attempt again at later date/time.)  4:34 PM, 11/13/19 Etta Grandchild, PT, DPT Physical Therapist - [facility]  [phone] ([place])    Pavle Wiler C 11/13/2019, 4:34 PM

## 2019-11-14 ENCOUNTER — Inpatient Hospital Stay: Payer: Medicare Other

## 2019-11-14 ENCOUNTER — Encounter: Payer: Self-pay | Admitting: Surgery

## 2019-11-14 DIAGNOSIS — Z9889 Other specified postprocedural states: Secondary | ICD-10-CM | POA: Diagnosis not present

## 2019-11-14 DIAGNOSIS — I998 Other disorder of circulatory system: Secondary | ICD-10-CM | POA: Diagnosis present

## 2019-11-14 DIAGNOSIS — R0600 Dyspnea, unspecified: Secondary | ICD-10-CM | POA: Diagnosis not present

## 2019-11-14 DIAGNOSIS — J90 Pleural effusion, not elsewhere classified: Principal | ICD-10-CM

## 2019-11-14 LAB — PH, BODY FLUID: pH, Body Fluid: 7.7

## 2019-11-14 LAB — PATHOLOGIST SMEAR REVIEW

## 2019-11-14 MED ORDER — IOHEXOL 350 MG/ML SOLN
75.0000 mL | Freq: Once | INTRAVENOUS | Status: AC | PRN
  Administered 2019-11-14: 75 mL via INTRAVENOUS

## 2019-11-14 MED ORDER — MAGNESIUM CITRATE PO SOLN
1.0000 | Freq: Once | ORAL | Status: AC
  Administered 2019-11-14: 1 via ORAL
  Filled 2019-11-14: qty 296

## 2019-11-14 MED ORDER — PANTOPRAZOLE SODIUM 40 MG PO TBEC: 40.0000 mg | DELAYED_RELEASE_TABLET | Freq: Every day | ORAL | Status: DC

## 2019-11-14 NOTE — Progress Notes (Signed)
[place] Hospital Day(s): 1.   Interval History:  Patient seen and examined no acute events or new complaints overnight.  Patient reports she actually feels much better this morning and her breathing is more baseline No fever, chills, nausea, emesis She still is without a bowel movement No new labs this morning There was concern on CTA Chest yesterday for possible left carotid occlusion. She had follow up CTA neck today which was again concerning for high-grade occlusion of the left carotid, vascular surgery is following her for this She is tolerating a diet Drain with <5 ccs; serous; will remove   Vital signs in last 24 hours: [min-max] current  Temp:  [97.5 F (36.4 C)-98.1 F (36.7 C)] 98.1 F (36.7 C) (11/12 1008) Pulse Rate:  [75-88] 88 (11/12 1008) Resp:  [16-20] 20 (11/12 1008) BP: (137-156)/(81-94) 137/81 (11/12 1008) SpO2:  [97 %-100 %] 100 % (11/12 1008)     Height: 5\' 2"  (157.5 cm) Weight: 48.9 kg BMI (Calculated): 19.71   Intake/Output last 2 shifts:  11/11 0701 - 11/12 0700 In: 505 [P.O.:240] Out: 3419 [Urine:1575; Drains:5]   Physical Exam:  Constitutional: alert, cooperative and no distress  Respiratory: breathing non-labored at rest  Cardiovascular: regular rate and sinus rhythm  Integumentary: Thoracotomy incision to the right chest wall, CDI, with staples. Subcutaneous drain in correct position; serous output (we will remove this)  Labs:  CBC Latest Ref Rng & Units 11/13/2019 11/08/2019 11/06/2019  WBC 4.0 - 10.5 K/uL 9.2 5.8 9.0  Hemoglobin 12.0 - 15.0 g/dL 12.7 12.0 12.9  Hematocrit 36 - 46 % 39.3 36.9 39.4  Platelets 150 - 400 K/uL 483(H) 253 224   CMP Latest Ref Rng & Units 11/13/2019 11/10/2019 11/08/2019  Glucose 70 - 99 mg/dL 100(H) 104(H) 92  BUN 8 - 23 mg/dL 10 11 10   Creatinine 0.44 - 1.00 mg/dL 0.53 0.58 0.61  Sodium 135 - 145 mmol/L 141 140 134(L)  Potassium 3.5 - 5.1 mmol/L 3.3(L) 3.5 4.2   Chloride 98 - 111 mmol/L 106 102 94(L)  CO2 22 - 32 mmol/L 25 28 28   Calcium 8.9 - 10.3 mg/dL 8.6(L) 8.5(L) 9.1  Total Protein 6.5 - 8.1 g/dL 7.1 - -  Total Bilirubin 0.3 - 1.2 mg/dL 0.5 - -  Alkaline Phos 38 - 126 U/L 74 - -  AST 15 - 41 U/L 36 - -  ALT 0 - 44 U/L 31 - -     Imaging studies:   CTA Neck (11/14/2019) personally reviewed showing likely occlusion of left carotid, and radiologist report reviewed below:  IMPRESSION: 1. Limited assessment of the proximal left common carotid artery due to streak artifact from intravenous contrast. 2. Implied high-grade proximal left CCA stenosis with faint opacification of the left common carotid and proximal ICA. The left ICA appears occluded by the skull base, but a small channel could become apparent in the delayed phase. 3. Multifocal atheromatous plaque of the right carotid without flow reducing stenosis.   Assessment/Plan:  64 y.o. 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   - Will add magnesemia citrate for constipation relief  - Remove subcutaneous drain  - pain control prn  - Pulmonary toilet  - mobilization as toelrated   - follow up vascular surgery recommendations    - Discharge planning: Pending vascular surgery recommendations, bowel function return....anticipate she  will be ready for discharge tomorrow (11/13)   All of the above findings and recommendations were discussed with the patient, patient's family (husband), and the medical team, and all of patient's and family's questions were answered to their expressed satisfaction.  -- Edison Simon, PA-C [facility] 11/14/2019, 1:36 PM [phone] M-F: 7am - 4pm

## 2019-11-14 NOTE — Progress Notes (Signed)
Tumor Board Documentation  Jobe Igo Jaber was presented by Army Chaco, RN at our Tumor Board on 11/13/2019, which included representatives from medical oncology, radiation oncology, internal medicine, navigation, pathology, radiology, surgical, pharmacy, genetics, research, palliative care, pulmonology.  Ann [name] currently presents as a new patient, for [place], for new positive pathology with history of the following treatments: surgical intervention(s), active survellience.  Additionally, we reviewed previous medical and familial history, history of present illness, and recent lab results along with all available histopathologic and imaging studies. The tumor board considered available treatment options and made the following recommendations: Active surveillance    The following procedures/referrals were also placed: No orders of the defined types were placed in this encounter.   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  Tumor board is a meeting of clinicians from various specialty areas who evaluate and discuss patients for whom a multidisciplinary approach is being considered. Final determinations in the plan of care are those of the provider(s). The responsibility for follow up of recommendations given during tumor board is that of the provider.   Today's extended care, comprehensive team conference, Kyrin was not present for the discussion and was not examined.   Multidisciplinary Tumor Board is a multidisciplinary case peer review process.  Decisions discussed in the Multidisciplinary Tumor Board reflect the opinions of the specialists present at the conference without having examined the patient.  Ultimately, treatment and diagnostic decisions rest with the primary provider(s) and the patient.

## 2019-11-14 NOTE — Evaluation (Signed)
Physical Therapy Evaluation Patient Details Name: Ann [name] MRN: [id number] DOB: 1955-10-19 Today's Date: 11/14/2019   History of Present Illness  Ann [name] is a 16yoF who comes to [facility] on 11/11 [name] SOB. PMH: COPD, CAD s/p stents, HTN, hypoK+, MI, CVA, syncope. Pt underwnet US guided thoracentesis on 11/11. Pt rencetly here and DC to home on 11/9.  Clinical Impression  Pt admitted with above diagnosis. Pt currently with functional limitations due to the deficits listed below (see "PT Problem List"). Upon entry, pt in bed, awake and agreeable to participate. The pt is alert and oriented x4, pleasant, conversational, and generally a good historian. Pt reports breathing significantly improved since thoracentesis. ModI bed mobility, modI transfers and AMB. Pt able to AMB >334ft safely, good tolerance. Functional mobility assessment demonstrates increased effort/time requirements, fair tolerance, but no need for physical assistance, whereas the patient performed these at a similar level of independence PTA. Pt will benefit from skilled PT intervention to increase independence and safety with basic mobility in preparation for discharge to the venue listed below.       Follow Up Recommendations Home health PT    Equipment Recommendations  Other (comment) (shower seat)    Recommendations for Other Services       Precautions / Restrictions Precautions Precautions: Fall Precaution Comments: R chest tube (okay for water seal with ambulation per order), R JP drain Restrictions Weight Bearing Restrictions: No      Mobility  Bed Mobility Overal bed mobility: Modified Independent                  Transfers Overall transfer level: Modified independent Equipment used: Rolling walker (2 wheeled) Transfers: Sit to/from Stand              Ambulation/Gait Ambulation/Gait assistance: Min Gaffer (Feet): 350 Feet Assistive device: Rolling walker (2  wheeled) Gait Pattern/deviations: WFL(Within Functional Limits) Gait velocity: 0.63m/s   General Gait Details: consistne tpacing, rpeorts to feel good, no SOB.  Stairs            Wheelchair Mobility    Modified Rankin (Stroke Patients Only)       Balance                                             Pertinent Vitals/Pain Pain Assessment: No/denies pain    Home Living Family/patient expects to be discharged to:: Private residence Living Arrangements: Spouse/significant other Available Help at Discharge: Family Type of Home: House Home Access: Ramped entrance;Stairs to enter Entrance Stairs-Rails: None Entrance Stairs-Number of Steps: 3 Home Layout: One level Home Equipment: Walker - 2 wheels Additional Comments: Does have ramp access in front of home if needed    Prior Function Level of Independence: Independent         Comments: Indep with ADLs, household and community mobilization without assist device; no home O2.  Does endorse history of syncopal events; resolved with cardiac intervention (July, 2021) per patient     Hand Dominance        Extremity/Trunk Assessment   Upper Extremity Assessment Upper Extremity Assessment: Overall WFL for tasks assessed    Lower Extremity Assessment Lower Extremity Assessment: Overall WFL for tasks assessed       Communication   Communication: No difficulties  Cognition Arousal/Alertness: Awake/alert Behavior During Therapy: WFL for tasks assessed/performed Overall Cognitive Status: Within  Functional Limits for tasks assessed                                        General Comments      Exercises     Assessment/Plan    PT Assessment Patient needs continued PT services  PT Problem List Decreased activity tolerance;Decreased balance;Decreased mobility;Cardiopulmonary status limiting activity;Pain       PT Treatment Interventions DME instruction;Functional mobility  training;Therapeutic activities;Balance training;Therapeutic exercise;Gait training;Patient/family education    PT Goals (Current goals can be found in the Care Plan section)  Acute Rehab PT Goals Patient Stated Goal: maintain strtength, breath better PT Goal Formulation: With patient Time For Goal Achievement: 11/28/19 Potential to Achieve Goals: Good    Frequency Min 2X/week   Barriers to discharge        Co-evaluation               AM-PAC PT "6 Clicks" Mobility  Outcome Measure Help needed turning from your back to your side while in a flat bed without using bedrails?: None Help needed moving from lying on your back to sitting on the side of a flat bed without using bedrails?: None Help needed moving to and from a bed to a chair (including a wheelchair)?: A Little Help needed standing up from a chair using your arms (e.g., wheelchair or bedside chair)?: A Little Help needed to walk in hospital room?: A Little Help needed climbing 3-5 steps with a railing? : A Little 6 Click Score: 20    End of Session Equipment Utilized During Treatment: Gait belt Activity Tolerance: Patient tolerated treatment well;No increased pain Patient left: with call bell/phone within reach;with family/visitor present;in bed;with nursing/sitter in room Nurse Communication: Mobility status PT Visit Diagnosis: Muscle weakness (generalized) (M62.81);Difficulty in walking, not elsewhere classified (R26.2)    Time: 1761-6073 PT Time Calculation (min) (ACUTE ONLY): 23 min   Charges:   PT Evaluation $PT Eval Moderate Complexity: 1 Mod PT Treatments $Therapeutic Exercise: 8-22 mins        12:35 PM, 11/14/19 Etta Grandchild, PT, DPT Physical Therapist - [facility]  [phone] ([place])    Ann [name] 11/14/2019, 12:30 PM

## 2019-11-14 NOTE — Consult Note (Signed)
[facility] Consult Note  MRN : [id number]  Ann [name] is a 64 y.o. (05-03-1955) female who presents with chief complaint of  Chief Complaint  Patient presents with  . Shortness of Breath   History of Present Illness:  Ann [name] is an 64 year old female with a past medical history of hypertension, hyperlipidemia, COPD, CAD s/p stent placement, right lung mass s/p right thoracotomy / upper lobectomy on 11/04/19 recently discharged home returning with progressively worsening shortness of breath.  Patient also endorses history of CVA.  Notes this occurred somewhere in the 1980 / 1990s.  No residual effects.  She denies any TIA like symptoms.  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   Vascular surgery was consulted by Dr. Lanney Gins for carotid stenosis.  Current Facility-Administered Medications  Medication Dose Route Frequency Provider Last Rate Last Admin  . fluticasone (FLONASE) 50 MCG/ACT nasal spray 2 spray  2 spray Each Nare Daily PRN Ronny Bacon, MD      . fluticasone furoate-vilanterol (BREO ELLIPTA) 100-25 MCG/INH 1 puff  1 puff Inhalation Daily Renda Rolls, RPH   1 puff at 11/14/19 0959   And  . umeclidinium bromide (INCRUSE ELLIPTA) 62.5 MCG/INH 1 puff  1 puff Inhalation Daily Renda Rolls, RPH   1 puff at 11/14/19 1001  . furosemide (LASIX) injection 40 mg  40 mg Intravenous Daily Ottie Glazier, MD   40 mg at 11/14/19 1001  . heparin injection 5,000 Units  5,000 Units Subcutaneous Q8H Ronny Bacon, MD   5,000 Units at 11/14/19 0540  . ibuprofen (ADVIL) tablet 600 mg  600 mg Oral Q6H PRN Ronny Bacon, MD   600 mg at 11/14/19 1003  . magnesium citrate solution 1 Bottle  1 Bottle Oral Once Tylene Fantasia, PA-C      . methocarbamol (ROBAXIN) tablet 500 mg  500 mg Oral Q8H PRN Ronny Bacon, MD   500 mg at 11/13/19 2049  . metoprolol  tartrate (LOPRESSOR) tablet 25 mg  25 mg Oral BID Ronny Bacon, MD   25 mg at 11/14/19 1000  . morphine 2 MG/ML injection 1 mg  1 mg Intravenous Q1H PRN Ottie Glazier, MD   1 mg at 11/14/19 0403  . pantoprazole (PROTONIX) EC tablet 40 mg  40 mg Oral QHS Dallie Piles, [facility]      . rosuvastatin (CRESTOR) tablet 20 mg  20 mg Oral Daily Ronny Bacon, MD   20 mg at 11/14/19 1000  . spironolactone (ALDACTONE) tablet 25 mg  25 mg Oral Daily Ronny Bacon, MD   25 mg at 11/14/19 1000   Past Medical History:  Diagnosis Date  . COPD (chronic obstructive pulmonary disease) ([place])   . Coronary artery disease    Prior stenting  . Hypertension Hypoxic  . Hypokalemia   . Multiple thyroid nodules   . Myocardial infarction ([place])   . Stroke [facility]) 2005   TIA  . Syncope    Past Surgical History:  Procedure Laterality Date  . BACK SURGERY     DECOMRESSION L4-5 WITH RODS AND FUSION  . CAROTID STENT  07/18/2003  . NECK SURGERY    . THORACOTOMY/LOBECTOMY Right 11/04/2019   Procedure: THORACOTOMY/LOBECTOMY;  Surgeon: Nestor Lewandowsky, MD;  Location: [facility] ORS;  Service: Thoracic;  Laterality: Right;  . TONSILLECTOMY    . TUBAL LIGATION     Social History Social  History   Tobacco Use  . Smoking status: Former Smoker    Packs/day: 0.25    Years: 49.00    Pack years: 12.25    Types: Cigarettes    Quit date: 10/17/2019    Years since quitting: 0.0  . Smokeless tobacco: Never Used  . Tobacco comment: 1 pack lasts 3-4 days  Vaping Use  . Vaping Use: Never used  Substance Use Topics  . Alcohol use: Yes    Comment: 1 shot of vodka with lemonade every evening  . Drug use: Never   Family History Family History  Problem Relation Age of Onset  . Alzheimer's disease Mother   . Heart attack Father   . Diabetes Mellitus II Brother   . Diabetes Mellitus II Brother   Denies family history of peripheral artery disease, venous disease or renal disease.  Allergies  Allergen Reactions  .  Clopidogrel Anaphylaxis and Hives   REVIEW OF SYSTEMS (Negative unless checked)  Constitutional: [] Weight loss  [] Fever  [] Chills Cardiac: [] Chest pain   [] Chest pressure   [] Palpitations   [] Shortness of breath when laying flat   [] Shortness of breath at rest   [x] Shortness of breath with exertion. Vascular:  [] Pain in legs with walking   [] Pain in legs at rest   [] Pain in legs when laying flat   [] Claudication   [] Pain in feet when walking  [] Pain in feet at rest  [] Pain in feet when laying flat   [] History of DVT   [] Phlebitis   [] Swelling in legs   [] Varicose veins   [] Non-healing ulcers Pulmonary:   [] Uses home oxygen   [] Productive cough   [] Hemoptysis   [] Wheeze  [] COPD   [] Asthma Neurologic:  [] Dizziness  [] Blackouts   [] Seizures   [x] History of stroke   [] History of TIA  [] Aphasia   [] Temporary blindness   [] Dysphagia   [] Weakness or numbness in arms   [] Weakness or numbness in legs Musculoskeletal:  [] Arthritis   [] Joint swelling   [] Joint pain   [] Low back pain Hematologic:  [] Easy bruising  [] Easy bleeding   [] Hypercoagulable state   [] Anemic  [] Hepatitis Gastrointestinal:  [] Blood in stool   [] Vomiting blood  [] Gastroesophageal reflux/heartburn   [] Difficulty swallowing. Genitourinary:  [] Chronic kidney disease   [] Difficult urination  [] Frequent urination  [] Burning with urination   [] Blood in urine Skin:  [] Rashes   [] Ulcers   [] Wounds Psychological:  [] History of anxiety   []  History of major depression.  Physical Examination  Vitals:   11/13/19 1944 11/13/19 2355 11/14/19 0454 11/14/19 1008  BP: (!) 142/94 (!) 150/92 (!) 156/91 137/81  Pulse: 88 76 75 88  Resp: 16 16 20 20   Temp: 98 F (36.7 C) (!) 97.5 F (36.4 C) 97.9 F (36.6 C) 98.1 F (36.7 C)  TempSrc: Oral Oral Oral Oral  SpO2: 98% 100% 100% 100%  Weight:      Height:       Body mass index is 19.72 kg/m. Gen:  WD/WN, NAD Head: [place]/AT, No temporalis wasting. Prominent temp pulse not noted. Ear/Nose/Throat:  Hearing grossly intact, nares w/o erythema or drainage, oropharynx w/o Erythema/Exudate Eyes: Sclera non-icteric, conjunctiva clear Neck: Trachea midline.  No JVD.  Pulmonary:  Good air movement, respirations not labored, equal bilaterally.  Cardiac: RRR, normal S1, S2. Vascular:  Vessel Right Left  Radial Palpable Palpable  Ulnar Palpable Palpable  Brachial Palpable Palpable  Carotid Palpable, without bruit Palpable, without bruit  Aorta Not palpable N/A  Femoral Palpable Palpable  Popliteal Palpable Palpable  PT Palpable Palpable  DP Palpable Palpable   Gastrointestinal: soft, non-tender/non-distended. No guarding/reflex.  Musculoskeletal: M/S 5/5 throughout.  Extremities without ischemic changes.  No deformity or atrophy. No edema. Neurologic: Sensation grossly intact in extremities.  Symmetrical.  Speech is fluent. Motor exam as listed above. Psychiatric: Judgment intact, Mood & affect appropriate for pt's clinical situation. Dermatologic: No rashes or ulcers noted.  No cellulitis or open wounds. Lymph : No Cervical, Axillary, or Inguinal lymphadenopathy.  CBC Lab Results  Component Value Date   WBC 9.2 11/13/2019   HGB 12.7 11/13/2019   HCT 39.3 11/13/2019   MCV 99.7 11/13/2019   PLT 483 (H) 11/13/2019   BMET    Component Value Date/Time   NA 141 11/13/2019 0159   K 3.3 (L) 11/13/2019 0159   CL 106 11/13/2019 0159   CO2 25 11/13/2019 0159   GLUCOSE 100 (H) 11/13/2019 0159   BUN 10 11/13/2019 0159   CREATININE 0.53 11/13/2019 0159   CALCIUM 8.6 (L) 11/13/2019 0159   GFRNONAA >60 11/13/2019 0159   GFRAA >60 09/18/2019 1031   Estimated Creatinine Clearance: 54.1 mL/min (by C-G formula based on SCr of 0.53 mg/dL).  COAG Lab Results  Component Value Date   INR 0.9 10/31/2019   Radiology DG Chest 2 View  Result Date: 11/08/2019 CLINICAL DATA:  Chest tube in place EXAM: CHEST - 2 VIEW COMPARISON:  11/06/2019 chest radiograph. FINDINGS: Loop recorder overlies the  left heart. Partially visualized surgical hardware from ACDF. Apical and basilar right chest tubes are stable in position. Skin staples overlie the lower outer right chest. Stable cardiomediastinal silhouette with top-normal heart size. No pneumothorax. No pleural effusion. No pulmonary edema. Mild left basilar atelectasis, improved. IMPRESSION: 1. No pneumothorax. Stable right chest tubes. 2. Mild left basilar atelectasis, improved. Electronically Signed   By: Ilona Sorrel M.D.   On: 11/08/2019 09:04   Chest 2 View  Result Date: 11/02/2019 CLINICAL DATA:  Pre op images, surgery on Nov 2nd. COPD, hypertension, myocardial infarction. Former smoker EXAM: CHEST - 2 VIEW COMPARISON:  Chest CT, 09/23/2019 FINDINGS: Heart is mildly enlarged. Left coronary artery stent. No mediastinal or hilar masses. No evidence of adenopathy. Lungs are hyperexpanded, but clear. No pleural effusion or pneumothorax. Skeletal structures are intact. IMPRESSION: No active cardiopulmonary disease. Electronically Signed   By: Lajean Manes M.D.   On: 11/02/2019 04:02   DG Abd 1 View  Result Date: 11/13/2019 CLINICAL DATA:  Constipation.  Syncope. EXAM: ABDOMEN - 1 VIEW COMPARISON:  11/10/2019 FINDINGS: Gas in nondilated large and small bowel loops shows interval improvement particularly in the small bowel gas. Pedicle screw fusion L4-5. Bilateral fallopian tube clips. Atherosclerotic calcification aorta and iliac arteries. No acute skeletal abnormality. IMPRESSION: Nonobstructive bowel gas pattern. Improvement in small bowel gas since the prior study which may reflect resolving small-bowel obstruction or ileus. Electronically Signed   By: Franchot Gallo M.D.   On: 11/13/2019 14:27   CT ANGIO NECK W OR WO CONTRAST  Result Date: 11/14/2019 CLINICAL DATA:  Carotid artery stenosis workup EXAM: CT ANGIOGRAPHY NECK TECHNIQUE: Multidetector CT imaging of the neck was performed using the standard protocol during bolus administration of  intravenous contrast. Multiplanar CT image reconstructions and MIPs were obtained to evaluate the vascular anatomy. Carotid stenosis measurements (when applicable) are obtained utilizing NASCET criteria, using the distal internal carotid diameter as the denominator. CONTRAST:  6mL OMNIPAQUE IOHEXOL 350 MG/ML SOLN COMPARISON:  Chest CTA from yesterday FINDINGS: Aortic  arch: Atheromatous wall thickening of the arch. Three vessel branching. Right carotid system: Diffuse calcified plaque along the common carotid with bulky calcified plaque along the medial carotid bulb. No stenosis of 50% or greater. No ulceration or beading. Left carotid system: Limited assessment of the left common carotid artery due to streak artifact from intravenous contrast. There is diminished flow at the under filled left common carotid which was also seen on prior chest CT, although there is some luminal opacification faintly seen to the level of the ICA bulb. Bulky calcified plaque is present at the carotid bifurcation. Left ECA branches are filled from collaterals in the arterial phase. Vertebral arteries: Atheromatous plaque the subclavian arteries. Evaluation of the left subclavian and axillary arteries is not possible due to the degree of streak artifact. Both vertebral arteries are smooth and widely patent into the dura. Skeleton: C4-5, C5-6, and C6-7 ACDF with solid arthrodesis. Dental caries. Other neck: No incidental inflammation or adenopathy seen in the neck. Asymmetric larger right lobe thyroid. Thyroid ultrasound was performed 10/21/2019 at outside facility. Upper chest: Emphysema. IMPRESSION: 1. Limited assessment of the proximal left common carotid artery due to streak artifact from intravenous contrast. 2. Implied high-grade proximal left CCA stenosis with faint opacification of the left common carotid and proximal ICA. The left ICA appears occluded by the skull base, but a small channel could become apparent in the delayed  phase. 3. Multifocal atheromatous plaque of the right carotid without flow reducing stenosis. Electronically Signed   By: Monte Fantasia M.D.   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 Electronically Signed   By: Constance Holster M.D.   On: 11/13/2019 21:11   Result Date: 11/13/2019 CLINICAL DATA:  Recent biopsy. EXAM: CT ANGIOGRAPHY CHEST WITH CONTRAST TECHNIQUE: Multidetector CT imaging of the chest was performed using the standard protocol during bolus administration of intravenous contrast. Multiplanar CT image reconstructions and MIPs were obtained to evaluate the vascular anatomy. CONTRAST:  6mL OMNIPAQUE IOHEXOL 350 MG/ML SOLN COMPARISON:  CT chest dated 09/23/2019.  PET-CT dated 10/02/2019 FINDINGS: Cardiovascular: Contrast injection is sufficient to demonstrate satisfactory opacification of the pulmonary arteries to the segmental level. There is no pulmonary embolus or evidence of right heart strain. The size of the main pulmonary artery is normal. Cardiomegaly with coronary artery calcification. The course and caliber of the aorta are normal. There is mild atherosclerotic calcification. Opacification decreased due to pulmonary arterial phase contrast bolus timing. 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 Mediastinum/Nodes: -- No mediastinal lymphadenopathy. -- No hilar lymphadenopathy. -- No axillary lymphadenopathy. -- No supraclavicular lymphadenopathy. --thyroid gland is enlarged with multiple thyroid nodules. This was previously evaluated by ultrasound. -  Unremarkable esophagus. Lungs/Pleura: The patient has undergone interval right upper lobe lobectomy. There is a trace right-sided pneumothorax. There is a moderate right-sided pleural effusion.  Emphysematous changes are noted bilaterally. There is atelectasis at the lung bases. There is a drainage catheter in the patient's right flank. Upper Abdomen: Contrast bolus timing is not optimized for evaluation of the abdominal organs. The visualized portions of the organs of the upper abdomen are normal. Musculoskeletal: There is an acute, minimally displaced fracture involving the anterolateral fifth rib on the right. No other additional acute displaced fracture identified on this  study. There are postsurgical changes along the patient's right flank with a few pockets of subcutaneous gas and associated soft tissue edema. Review of the MIP images confirms the above findings. IMPRESSION: 1. No evidence of acute pulmonary embolus. 2. Status post right upper lobe lobectomy. 3. Trace right-sided pneumothorax. There is a drainage catheter situated in the soft tissues of the right flank. It is not clear if this catheter is well positioned or represents a malpositioned right-sided chest tube. Correlation with the patient's surgical history is recommended. None of this tube is currently located within the thoracic cavity. 4. Moderate right-sided pleural effusion. 5. Bibasilar atelectasis. 6. Cardiomegaly with coronary artery disease. Aortic Atherosclerosis (ICD10-I70.0) and Emphysema (ICD10-J43.9). Electronically Signed: By: Constance Holster M.D. On: 11/13/2019 03:48   DG Chest Port 1 View  Result Date: 11/13/2019 CLINICAL DATA:  Status post right thoracentesis EXAM: PORTABLE CHEST 1 VIEW COMPARISON:  11/13/2019 FINDINGS: Cardiac shadow is within normal limits. Loop recorder is again noted. Small residual right-sided effusion is noted likely related to the septation encountered on recent thoracentesis. Multiple right rib fractures are seen as well as some pleural based density similar to that seen on recent CT. No pneumothorax is noted. The previously placed chest tube is noted coiled within the chest wall  extrinsic to the ribcage. IMPRESSION: Minimal residual pleural effusion on the right with no evidence of pneumothorax. Electronically Signed   By: Inez Catalina M.D.   On: 11/13/2019 15:41   DG Chest Port 1 View  Result Date: 11/13/2019 CLINICAL DATA:  Dyspnea EXAM: PORTABLE CHEST 1 VIEW COMPARISON:  11/09/2019, CT 09/23/2019 FINDINGS: Lung volumes are small. Surgical staple line noted at the right lung base with associated right-sided volume loss in keeping with partial right lung resection. There is a nodular pleural based density noted just superior to the staple line, similar to that noted on prior examination, which may represent a a laterally loculated pleural effusion or fluid within the fissure given its relatively rapid development. The lungs are otherwise clear. No pneumothorax. No pleural effusion on the left. Cardiac size within normal limits. Implanted loop recorder again noted. Surgical drain noted within the right breast with associated surgical skin staples. No acute bone abnormality. IMPRESSION: Stable examination. Pleural based opacity likely represents small loculated pleural fluid. No acute disease identified. Electronically Signed   By: Fidela Salisbury MD   On: 11/13/2019 02:20   DG Chest Port 1 View  Result Date: 11/09/2019 CLINICAL DATA:  RIGHT chest tube removal EXAM: PORTABLE CHEST 1 VIEW COMPARISON:  Portable exam 1535 hours compared to 0706 hours FINDINGS: Interval removal of thoracostomy tube. Additional surgical drain projects over the lower lateral RIGHT chest wall. Loop recorder projects over LEFT heart. Upper normal size of cardiac silhouette. Mediastinal contours and pulmonary vascularity normal. Atherosclerotic calcification aorta. Postsurgical changes RIGHT mid lung with subsegmental atelectasis at LEFT base. Small RIGHT apical pneumothorax post thoracostomy tube removal. No LEFT pneumothorax. Fracture of lateral RIGHT fifth rib. IMPRESSION: Small RIGHT apex pneumothorax  following thoracostomy tube removal. Subsegmental atelectasis LEFT base. Fracture of lateral RIGHT fifth rib. Findings called to Amanda on [place] on 11/07/201 at 1840 hrs. Findings called to Dr. Dahlia Byes on 11/09/2019 at [place] hrs. Electronically Signed   By: Lavonia Dana M.D.   On: 11/09/2019 19:09   DG CHEST PORT 1 VIEW  Result Date: 11/09/2019 CLINICAL DATA:  Status post right lobectomy, 1 right chest tube removed EXAM: PORTABLE CHEST 1 VIEW COMPARISON:  None. FINDINGS: The previous  right chest tube extending to the apex has been removed. Stable right lower chest surgical drain and residual basilar chest tube. Stable aeration with postoperative changes noted and right hemithorax volume loss. No developing effusion or significant pneumothorax. Stable heart size and vascularity. Stable left lung aeration. Loop recorder device over the left cardiac border. Trachea midline. Aorta atherosclerotic. Similar gaseous distention of the bowel in the epigastric region. IMPRESSION: Stable postoperative findings. Removal of 1 of the right chest tubes. No effusion or pneumothorax. Electronically Signed   By: Jerilynn Mages.  Shick M.D.   On: 11/09/2019 14:54   DG Chest Port 1 View  Result Date: 11/06/2019 CLINICAL DATA:  Postop check. EXAM: PORTABLE CHEST 1 VIEW COMPARISON:  11/04/2019. FINDINGS: Two right chest tubes in stable position. No pneumothorax identified. Cardiac monitor device in stable position. Stable cardiomegaly. Stable left base atelectasis. Postsurgical changes right lung. Mild right chest wall subcutaneous emphysema again noted. Prior cervical fusion. IMPRESSION: 1. Two right chest tubes in stable position. No pneumothorax identified. Stable right chest wall mild subcutaneous emphysema. 2. Stable left base atelectasis. Stable postsurgical changes right chest. 3. Stable cardiomegaly. Electronically Signed   By: Marcello Moores  Register   On: 11/06/2019 05:32   DG Chest Port 1 View  Result Date: 11/04/2019 CLINICAL DATA:   Postoperative check EXAM: PORTABLE CHEST 1 VIEW COMPARISON:  Portable exam 1220 hours compared to 10/31/2019 FINDINGS: RIGHT thoracostomy tubes new since prior exam. Loop recorder and coronary stent project over heart. Minimal enlargement of cardiac silhouette with slight vascular congestion. Atherosclerotic calcifications aorta. Lungs clear. Postsurgical changes RIGHT hemithorax. No pleural effusion or pneumothorax. IMPRESSION: Postsurgical changes of the RIGHT hemithorax. No acute abnormalities. Aortic Atherosclerosis (ICD10-I70.0). Electronically Signed   By: Lavonia Dana M.D.   On: 11/04/2019 12:47   DG ABD ACUTE 2+V W 1V CHEST  Result Date: 11/10/2019 CLINICAL DATA:  Syncope, ileus. EXAM: DG ABDOMEN ACUTE WITH 1 VIEW CHEST COMPARISON:  November 09, 2019. FINDINGS: There is no evidence of dilated bowel loops or free intraperitoneal air. No radiopaque calculi or other significant radiographic abnormality is seen. Heart size and mediastinal contours are within normal limits. Stable small right apical pneumothorax is noted. Stable bibasilar subsegmental atelectasis is noted. IMPRESSION: Stable small right apical pneumothorax. Stable bibasilar subsegmental atelectasis. No evidence of bowel obstruction or ileus. Electronically Signed   By: Marijo Conception M.D.   On: 11/10/2019 08:07   CUP PACEART REMOTE DEVICE CHECK  Result Date: 11/10/2019 ILR summary report received. Battery status OK. Normal device function. No new symptom, tachy, brady, or pause episodes. No new AF episodes. Monthly summary reports and ROV/PRN  US THORACENTESIS ASP PLEURAL SPACE W/IMG GUIDE  Result Date: 11/13/2019 INDICATION: Right-sided pleural effusion EXAM: ULTRASOUND GUIDED RIGHT THORACENTESIS MEDICATIONS: None. COMPLICATIONS: None immediate. PROCEDURE: An ultrasound guided thoracentesis was thoroughly discussed with the patient and questions answered. The benefits, risks, alternatives and complications were also discussed. The  patient understands and wishes to proceed with the procedure. Written consent was obtained. Ultrasound was performed to localize and mark an adequate pocket of fluid in the right chest. The area was then prepped and draped in the normal sterile fashion. 1% Lidocaine was used for local anesthesia. Under ultrasound guidance a 6 Fr Safe-T-Centesis catheter was introduced. Thoracentesis was performed. The catheter was removed and a dressing applied. FINDINGS: A total of approximately 300 mL of bloody fluid was removed. Samples were sent to the laboratory as requested by the clinical team. IMPRESSION: Successful ultrasound guided right thoracentesis yielding  300 mL of pleural fluid. Some mild septation was fluid. Electronically Signed   By: Inez Catalina M.D.   On: 11/13/2019 15:35   Assessment/Plan Ann [name] is an 64 year old female with a past medical history of hypertension, hyperlipidemia, COPD, CAD s/p stent placement, right lung mass s/p right thoracotomy / upper lobectomy on 11/04/19 recently discharged home returning with progressively worsening shortness of breath.  1.  Carotid Stenosis: Patient with history of CVA in the past, states occurred in either the 1980s or 1990s.  She is currently asymptomatic at this time.  CTA of the chest yesterday was notable for probable occlusion of the left common carotid artery.  Follow-up CTA of the neck today unable to confirm occlusion stating possible "small channel".  Recommend undergoing a carotid angiogram in attempt assess the patient's anatomy and confirm whether or not there is actual occlusion of the vessel.  If not completely occluded, the patient may be a candidate for repair however since her CVA was 33 to 40 years ago would assume her vessel is most likely occluded.  Right cervical carotid system is patent.  The patient is hesitant to move forward with the angiogram in the setting of her multiple medical issues.  Since she is asymptomatic at this time and  has been shouldn't be an issue.  We are happy to see her in the outpatient setting.   2.  Hyperlipidemia: On aspirin and statin for medical management Encouraged good control as its slows the progression of atherosclerotic disease.   3. Hypertension: On appropriate medications. Encouraged good control as its slows the progression of atherosclerotic disease.   Discussed with [place]  Sela Hua, PA-C  11/14/2019 3:12 PM  This note was created with Dragon medical transcription system.  Any error is purely unintentional

## 2019-11-14 NOTE — Progress Notes (Signed)
Pulmonary Medicine          Date: 11/14/2019,   MRN# [id number] Ann [name] Nov 18, 1955     AdmissionWeight: 48.9 kg                 CurrentWeight: 48.9 kg  Referring physician: Dr. Genevive Bi    CHIEF COMPLAINT:   Chest discomfort and respiratory distress   HISTORY OF PRESENT ILLNESS   This pleasant 64 year old female who was seen by me in pulmonary clinic in October due to dyspnea and findings of lung nodule of the right upper lobe.  Patient was seen over last few months with evaluation of chest imaging via thoracic navigator subsequently required thoracic surgery evaluation with plan for surgery.  Previous to this she had episodes of syncope and was seen by cardiology with placement of a loop recorder and additional work-up for arrhythmias with EP.  She has a history of lifelong smoking and was actively smoking as of last month when I saw her.  When we met we reviewed her nodule which was about 1.2 cm and was mildly hypermetabolic at 2.6 SUVs on most recent PET scan.  She had a full pulmonary function test done which showed that her FEV1 is at approximately 1.5 L which is 83% of predicted.  Overall she is fully functional and had dyspnea at baseline only after prolonged walking.  Additionally due to COPD she has chronic phlegm production with cough.  Patient underwent right thoracotomy with right upper lobe lobectomy on November 3.  Postoperatively she did well developed transient ileus which resolved spontaneously was able to advance her diet and was subsequently discharged home with JP drain from right chest.  She came into emergency room overnight and complained of worsening dyspnea x1d.  Work-up in the ER shows blood work which is essentially normal with absence of leukocytosis or anemia and mild thrombocytosis which is likely reactive post surgery.  CMP is with borderline low potassium only otherwise within reference range.  BNP is slightly elevated over 200 and patient does  have significant cardiac history, however troponins x2 in the ER overnight have been normal.  She had CT angio done there is finding of 5th R rib fracture acute, small right pneumothorax, moderate right pleural effusion. Patient states she is constipated and belly is distended causing pain and hindering breathing. She is able to take tidal volume on incentive spirometer >1080m during examination today.   PAST MEDICAL HISTORY   Past Medical History:  Diagnosis Date  . COPD [place] obstructive [place] disease) ([place]   . Coronary artery disease    Prior stenting  . Hypertension Hypoxic  . Hypokalemia   . Multiple thyroid nodules   . [place]   . St[facility]   TIA  . Syncope      SURGICAL HISTORY   Past Surgical History:  Procedure Laterality Date  . BACK SURGERY     DECOMRESSION L4-5 WITH RODS AND FUSION  . CAROTID STENT  07/18/2003  . NECK SURGERY    . THORACOTOMY/LOBECTOMY Right 11/04/2019   Procedure: THORACOTOMY/LOBECTOMY;  Surgeon: ONestor Lewandowsky MD;  Location: [facility] ORS;  Service: Thoracic;  Laterality: Right;  . TONSILLECTOMY    . TUBAL LIGATION       FAMILY HISTORY   Family History  Problem Relation Age of Onset  . Alzheimer's disease Mother   . Heart attack Father   . Diabetes Mellitus II Brother   . Diabetes Mellitus II Brother  SOCIAL HISTORY   Social History   Tobacco Use  . Smoking status: Former Smoker    Packs/day: 0.25    Years: 49.00    Pack years: 12.25    Types: Cigarettes    Quit date: 10/17/2019    Years since quitting: 0.0  . Smokeless tobacco: Never Used  . Tobacco comment: 1 pack lasts 3-4 days  Vaping Use  . Vaping Use: Never used  Substance Use Topics  . Alcohol use: Yes    Comment: 1 shot of vodka with lemonade every evening  . Drug use: Never     MEDICATIONS    Home Medication:    Current Medication:  Current Facility-Administered Medications:  .  fluticasone (FLONASE) 50 MCG/ACT nasal  spray 2 spray, 2 spray, Each Nare, Daily PRN, Ronny Bacon, MD .  fluticasone furoate-vilanterol (BREO ELLIPTA) 100-25 MCG/INH 1 puff, 1 puff, Inhalation, Daily, 1 puff at 11/14/19 0959 **AND** umeclidinium bromide (INCRUSE ELLIPTA) 62.5 MCG/INH 1 puff, 1 puff, Inhalation, Daily, Belue, Alver Sorrow, RPH, 1 puff at 11/14/19 1001 .  furosemide (LASIX) injection 40 mg, 40 mg, Intravenous, Daily, Lanney Gins, Johnnetta Holstine, MD, 40 mg at 11/14/19 1001 .  heparin injection 5,000 Units, 5,000 Units, Subcutaneous, Q8H, Ronny Bacon, MD, 5,000 Units at 11/14/19 0540 .  ibuprofen (ADVIL) tablet 600 mg, 600 mg, Oral, Q6H PRN, Ronny Bacon, MD, 600 mg at 11/14/19 1003 .  methocarbamol (ROBAXIN) tablet 500 mg, 500 mg, Oral, Q8H PRN, Ronny Bacon, MD, 500 mg at 11/13/19 2049 .  metoprolol tartrate (LOPRESSOR) tablet 25 mg, 25 mg, Oral, BID, Ronny Bacon, MD, 25 mg at 11/14/19 1000 .  morphine 2 MG/ML injection 1 mg, 1 mg, Intravenous, Q1H PRN, Ottie Glazier, MD, 1 mg at 11/14/19 0403 .  pantoprazole (PROTONIX) injection 40 mg, 40 mg, Intravenous, QHS, Ronny Bacon, MD, 40 mg at 11/13/19 2049 .  rosuvastatin (CRESTOR) tablet 20 mg, 20 mg, Oral, Daily, Ronny Bacon, MD, 20 mg at 11/14/19 1000 .  spironolactone (ALDACTONE) tablet 25 mg, 25 mg, Oral, Daily, Ronny Bacon, MD, 25 mg at 11/14/19 1000    ALLERGIES   Clopidogrel     REVIEW OF SYSTEMS    Review of Systems:  Gen:  Denies  fever, sweats, chills weigh loss  HEENT: Denies blurred vision, double vision, ear pain, eye pain, hearing loss, nose bleeds, sore throat Cardiac:  No dizziness, chest pain or heaviness, chest tightness,edema Resp:   Denies cough or sputum porduction, shortness of breath,wheezing, hemoptysis,  Gi: Denies swallowing difficulty, stomach pain, nausea or vomiting, diarrhea, constipation, bowel incontinence Gu:  Denies bladder incontinence, burning urine Ext:   Denies Joint pain, stiffness or swelling Skin:  Denies  skin rash, easy bruising or bleeding or hives Endoc:  Denies polyuria, polydipsia , polyphagia or weight change Psych:   Denies depression, insomnia or hallucinations   Other:  All other systems negative   VS: BP 137/81   Pulse 88   Temp 98.1 F (36.7 C) (Oral)   Resp 20   Ht 5' 2"  (1.575 m)   Wt 48.9 kg   SpO2 100%   BMI 19.72 kg/m      PHYSICAL EXAM    GENERAL:NAD, no fevers, chills, no weakness no fatigue HEAD: Normocephalic, atraumatic.  EYES: Pupils equal, round, reactive to light. Extraocular muscles intact. No scleral icterus.  MOUTH: Moist mucosal membrane. Dentition intact. No abscess noted.  EAR, NOSE, THROAT: Clear without exudates. No external lesions.  NECK: Supple. No thyromegaly. No nodules. No JVD.  PULMONARY: clear to auscultation bilaterally  CARDIOVASCULAR: S1 and S2. Regular rate and rhythm. No murmurs, rubs, or gallops. No edema. Pedal pulses 2+ bilaterally.  GASTROINTESTINAL: Soft, nontender, nondistended. No masses. Positive bowel sounds. No hepatosplenomegaly.  MUSCULOSKELETAL: No swelling, clubbing, or edema. Range of motion full in all extremities.  NEUROLOGIC: Cranial nerves II through XII are intact. No gross focal neurological deficits. Sensation intact. Reflexes intact.  SKIN: No ulceration, lesions, rashes, or cyanosis. Skin warm and dry. Turgor intact.  PSYCHIATRIC: Mood, affect within normal limits. The patient is awake, alert and oriented x 3. Insight, judgment intact.       IMAGING    DG Chest 2 View  Result Date: 11/08/2019 CLINICAL DATA:  Chest tube in place EXAM: CHEST - 2 VIEW COMPARISON:  11/06/2019 chest radiograph. FINDINGS: Loop recorder overlies the left heart. Partially visualized surgical hardware from ACDF. Apical and basilar right chest tubes are stable in position. Skin staples overlie the lower outer right chest. Stable cardiomediastinal silhouette with top-normal heart size. No pneumothorax. No pleural effusion.  No pulmonary edema. Mild left basilar atelectasis, improved. IMPRESSION: 1. No pneumothorax. Stable right chest tubes. 2. Mild left basilar atelectasis, improved. Electronically Signed   By: Ilona Sorrel M.D.   On: 11/08/2019 09:04   Chest 2 View  Result Date: 11/02/2019 CLINICAL DATA:  Pre op images, surgery on Nov 2nd. COPD, hypertension, myocardial infarction. Former smoker EXAM: CHEST - 2 VIEW COMPARISON:  Chest CT, 09/23/2019 FINDINGS: Heart is mildly enlarged. Left coronary artery stent. No mediastinal or hilar masses. No evidence of adenopathy. Lungs are hyperexpanded, but clear. No pleural effusion or pneumothorax. Skeletal structures are intact. IMPRESSION: No active cardiopulmonary disease. Electronically Signed   By: Lajean Manes M.D.   On: 11/02/2019 04:02   DG Abd 1 View  Result Date: 11/13/2019 CLINICAL DATA:  Constipation.  Syncope. EXAM: ABDOMEN - 1 VIEW COMPARISON:  11/10/2019 FINDINGS: Gas in nondilated large and small bowel loops shows interval improvement particularly in the small bowel gas. Pedicle screw fusion L4-5. Bilateral fallopian tube clips. Atherosclerotic calcification aorta and iliac arteries. No acute skeletal abnormality. IMPRESSION: Nonobstructive bowel gas pattern. Improvement in small bowel gas since the prior study which may reflect resolving small-bowel obstruction or ileus. Electronically Signed   By: Franchot Gallo M.D.   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 Electronically Signed   By: Constance Holster M.D.   On: 11/13/2019 21:11   Result Date: 11/13/2019 CLINICAL DATA:  Recent biopsy. EXAM: CT ANGIOGRAPHY CHEST WITH CONTRAST TECHNIQUE: Multidetector CT imaging of the chest was performed using the standard protocol during bolus  administration of intravenous contrast. Multiplanar CT image reconstructions and MIPs were obtained to evaluate the vascular anatomy. CONTRAST:  67m OMNIPAQUE IOHEXOL 350 MG/ML SOLN COMPARISON:  CT chest dated 09/23/2019.  PET-CT dated 10/02/2019 FINDINGS: Cardiovascular: Contrast injection is sufficient to demonstrate satisfactory opacification of the pulmonary arteries to the segmental level. There is no pulmonary embolus or evidence of right heart strain. The size of the main pulmonary artery is normal. Cardiomegaly with coronary artery calcification. The course and caliber of the aorta are normal. There is mild atherosclerotic calcification. Opacification decreased due to pulmonary arterial phase contrast bolus timing. 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  Mediastinum/Nodes: -- No mediastinal lymphadenopathy. -- No hilar lymphadenopathy. -- No axillary lymphadenopathy. -- No supraclavicular lymphadenopathy. --thyroid gland is enlarged with multiple thyroid nodules. This was previously evaluated by ultrasound. -  Unremarkable esophagus. Lungs/Pleura: The patient has undergone interval right upper lobe lobectomy. There is a trace right-sided pneumothorax. There is a moderate right-sided pleural effusion. Emphysematous changes are noted bilaterally. There is atelectasis at the lung bases. There is a drainage catheter in the patient's right flank. Upper Abdomen: Contrast bolus timing is not optimized for evaluation of the abdominal organs. The visualized portions of the organs of the upper abdomen are normal. Musculoskeletal: There is an acute, minimally displaced fracture involving the anterolateral fifth rib on the right. No other additional acute displaced fracture identified on this study. There are postsurgical changes along the patient's right flank with a few pockets of subcutaneous gas and associated soft tissue edema. Review of the MIP images confirms the above findings.  IMPRESSION: 1. No evidence of acute pulmonary embolus. 2. Status post right upper lobe lobectomy. 3. Trace right-sided pneumothorax. There is a drainage catheter situated in the soft tissues of the right flank. It is not clear if this catheter is well positioned or represents a malpositioned right-sided chest tube. Correlation with the patient's surgical history is recommended. None of this tube is currently located within the thoracic cavity. 4. Moderate right-sided pleural effusion. 5. Bibasilar atelectasis. 6. Cardiomegaly with coronary artery disease. Aortic Atherosclerosis (ICD10-I70.0) and Emphysema (ICD10-J43.9). Electronically Signed: By: Constance Holster M.D. On: 11/13/2019 03:48   DG Chest Port 1 View  Result Date: 11/13/2019 CLINICAL DATA:  Status post right thoracentesis EXAM: PORTABLE CHEST 1 VIEW COMPARISON:  11/13/2019 FINDINGS: Cardiac shadow is within normal limits. Loop recorder is again noted. Small residual right-sided effusion is noted likely related to the septation encountered on recent thoracentesis. Multiple right rib fractures are seen as well as some pleural based density similar to that seen on recent CT. No pneumothorax is noted. The previously placed chest tube is noted coiled within the chest wall extrinsic to the ribcage. IMPRESSION: Minimal residual pleural effusion on the right with no evidence of pneumothorax. Electronically Signed   By: Inez Catalina M.D.   On: 11/13/2019 15:41   DG Chest Port 1 View  Result Date: 11/13/2019 CLINICAL DATA:  Dyspnea EXAM: PORTABLE CHEST 1 VIEW COMPARISON:  11/09/2019, CT 09/23/2019 FINDINGS: Lung volumes are small. Surgical staple line noted at the right lung base with associated right-sided volume loss in keeping with partial right lung resection. There is a nodular pleural based density noted just superior to the staple line, similar to that noted on prior examination, which may represent a a laterally loculated pleural effusion or  fluid within the fissure given its relatively rapid development. The lungs are otherwise clear. No pneumothorax. No pleural effusion on the left. Cardiac size within normal limits. Implanted loop recorder again noted. Surgical drain noted within the right breast with associated surgical skin staples. No acute bone abnormality. IMPRESSION: Stable examination. Pleural based opacity likely represents small loculated pleural fluid. No acute disease identified. Electronically Signed   By: Fidela Salisbury MD   On: 11/13/2019 02:20   DG Chest Port 1 View  Result Date: 11/09/2019 CLINICAL DATA:  RIGHT chest tube removal EXAM: PORTABLE CHEST 1 VIEW COMPARISON:  Portable exam 1535 hours compared to 0706 hours FINDINGS: Interval removal of thoracostomy tube. Additional surgical drain projects over the lower lateral RIGHT chest wall. Loop recorder projects over LEFT heart. Upper  normal size of cardiac silhouette. Mediastinal contours and pulmonary vascularity normal. Atherosclerotic calcification aorta. Postsurgical changes RIGHT mid lung with subsegmental atelectasis at LEFT base. Small RIGHT apical pneumothorax post thoracostomy tube removal. No LEFT pneumothorax. Fracture of lateral RIGHT fifth rib. IMPRESSION: Small RIGHT apex pneumothorax following thoracostomy tube removal. Subsegmental atelectasis LEFT base. Fracture of lateral RIGHT fifth rib. Findings called to [place] on [place] on 11/07/201 at 1840 hrs. Findings called to Dr. Dahlia Byes on 11/09/2019 at [place] hrs. Electronically Signed   By: Lavonia Dana M.D.   On: 11/09/2019 19:09   DG CHEST PORT 1 VIEW  Result Date: 11/09/2019 CLINICAL DATA:  Status post right lobectomy, 1 right chest tube removed EXAM: PORTABLE CHEST 1 VIEW COMPARISON:  None. FINDINGS: The previous right chest tube extending to the apex has been removed. Stable right lower chest surgical drain and residual basilar chest tube. Stable aeration with postoperative changes noted and right hemithorax  volume loss. No developing effusion or significant pneumothorax. Stable heart size and vascularity. Stable left lung aeration. Loop recorder device over the left cardiac border. Trachea midline. Aorta atherosclerotic. Similar gaseous distention of the bowel in the epigastric region. IMPRESSION: Stable postoperative findings. Removal of 1 of the right chest tubes. No effusion or pneumothorax. Electronically Signed   By: Jerilynn Mages.  Shick M.D.   On: 11/09/2019 14:54   DG Chest Port 1 View  Result Date: 11/06/2019 CLINICAL DATA:  Postop check. EXAM: PORTABLE CHEST 1 VIEW COMPARISON:  11/04/2019. FINDINGS: Two right chest tubes in stable position. No pneumothorax identified. Cardiac monitor device in stable position. Stable cardiomegaly. Stable left base atelectasis. Postsurgical changes right lung. Mild right chest wall subcutaneous emphysema again noted. Prior cervical fusion. IMPRESSION: 1. Two right chest tubes in stable position. No pneumothorax identified. Stable right chest wall mild subcutaneous emphysema. 2. Stable left base atelectasis. Stable postsurgical changes right chest. 3. Stable cardiomegaly. Electronically Signed   By: Marcello Moores  Register   On: 11/06/2019 05:32   DG Chest Port 1 View  Result Date: 11/04/2019 CLINICAL DATA:  Postoperative check EXAM: PORTABLE CHEST 1 VIEW COMPARISON:  Portable exam 1220 hours compared to 10/31/2019 FINDINGS: RIGHT thoracostomy tubes new since prior exam. Loop recorder and coronary stent project over heart. Minimal enlargement of cardiac silhouette with slight vascular congestion. Atherosclerotic calcifications aorta. Lungs clear. Postsurgical changes RIGHT hemithorax. No pleural effusion or pneumothorax. IMPRESSION: Postsurgical changes of the RIGHT hemithorax. No acute abnormalities. Aortic Atherosclerosis (ICD10-I70.0). Electronically Signed   By: Lavonia Dana M.D.   On: 11/04/2019 12:47   DG ABD ACUTE 2+V W 1V CHEST  Result Date: 11/10/2019 CLINICAL DATA:  Syncope,  ileus. EXAM: DG ABDOMEN ACUTE WITH 1 VIEW CHEST COMPARISON:  November 09, 2019. FINDINGS: There is no evidence of dilated bowel loops or free intraperitoneal air. No radiopaque calculi or other significant radiographic abnormality is seen. Heart size and mediastinal contours are within normal limits. Stable small right apical pneumothorax is noted. Stable bibasilar subsegmental atelectasis is noted. IMPRESSION: Stable small right apical pneumothorax. Stable bibasilar subsegmental atelectasis. No evidence of bowel obstruction or ileus. Electronically Signed   By: Marijo Conception M.D.   On: 11/10/2019 08:07   CUP PACEART REMOTE DEVICE CHECK  Result Date: 11/10/2019 ILR summary report received. Battery status OK. Normal device function. No new symptom, tachy, brady, or pause episodes. No new AF episodes. Monthly summary reports and ROV/PRN  US THORACENTESIS ASP PLEURAL SPACE W/IMG GUIDE  Result Date: 11/13/2019 INDICATION: Right-sided pleural effusion EXAM: ULTRASOUND GUIDED  RIGHT THORACENTESIS MEDICATIONS: None. COMPLICATIONS: None immediate. PROCEDURE: An ultrasound guided thoracentesis was thoroughly discussed with the patient and questions answered. The benefits, risks, alternatives and complications were also discussed. The patient understands and wishes to proceed with the procedure. Written consent was obtained. Ultrasound was performed to localize and mark an adequate pocket of fluid in the right chest. The area was then prepped and draped in the normal sterile fashion. 1% Lidocaine was used for local anesthesia. Under ultrasound guidance a 6 Fr Safe-T-Centesis catheter was introduced. Thoracentesis was performed. The catheter was removed and a dressing applied. FINDINGS: A total of approximately 300 mL of bloody fluid was removed. Samples were sent to the laboratory as requested by the clinical team. IMPRESSION: Successful ultrasound guided right thoracentesis yielding 300 mL of pleural fluid. Some  mild septation was fluid. Electronically Signed   By: Inez Catalina M.D.   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 - s/p thoracotomy and lobectomy  - right sided drain with minimal activity   - CT surgery on case - appreciate input   Moderate pleural effusion  - likely residual post operatively with partial ex-vacuo physiology - R PJ drain with serosang - 25cc - will stop IV fluids and initiate gentle diuresis - consider thoracentesis - will discuss with Dr Genevive Bi - patient on room air   Right fifth acute rib fracture  - causing pain with respiratory movements - contributing to dyspnea -supportive care - analgesia - patient currently at 7/10 - will add low dose low dose morphine , patient states she does not want PO -patient does have chronic constipation - this can also be worsened with opioids and will cause worsening dyspnea.  Patient states she took double dose of oxycontin while home    Trace right pneumothorax   - supportive care   - nasal canula supplemental O2    Bilateral Basal atelectasis    - will add BPH with metaNEB bid   - IS at bedside - 1059m vT    Chronic COPD  -Stage 1B - FEV1 83% predicted at 1.53L prior to lobectomy.  -No signs of acute exacerbation   - continue generic COPD carepath  - CPT - IS/Flutter   Left common carotid occlusion    - patient had CVA in the past   - she wishes for this to be further investigated   - will ask vascular service for consultation     -CTA today 11/12      Thank you for allowing me to participate in the care of this patient.     Patient/Family are satisfied with care plan and all questions have been answered.  This document was prepared using Dragon voice recognition software and may include unintentional dictation errors.     FOttie Glazier M.D.  [facility] of [place]

## 2019-11-14 NOTE — Progress Notes (Signed)
[facility] Daily Progress Note   Consult received for left carotid occlusion noted on CTA chest (11/13/19) Will order CTA neck Full consult to follow  Marcelle Overlie PA-C 11/14/2019 9:39 AM

## 2019-11-14 NOTE — Progress Notes (Signed)
PHARMACIST - PHYSICIAN COMMUNICATION  DR:   Genevive Bi  CONCERNING: IV to Oral Route Change Policy  RECOMMENDATION: This patient is receiving pantoprazole by the intravenous route.  Based on criteria approved by the Pharmacy and Therapeutics Committee, the intravenous medication(s) is/are being converted to the equivalent oral dose form(s).   DESCRIPTION: These criteria include:  The patient is eating (either orally or via tube) and/or has been taking other orally administered medications for a least 24 hours  The patient has no evidence of active gastrointestinal bleeding or impaired GI absorption (gastrectomy, short bowel, patient on TNA or NPO).  If you have questions about this conversion, please contact the Pharmacy Department  []   [phone] )  [facility] [x]   [phone] )  [facility] []   [phone] )  [facility] []   [phone] )  [facility] []   [phone] )  [place], [facility] 11/14/2019 1:48 PM

## 2019-11-17 LAB — BODY FLUID CULTURE: Culture: NO GROWTH

## 2019-11-18 ENCOUNTER — Encounter: Payer: Self-pay | Admitting: Cardiothoracic Surgery

## 2019-11-18 ENCOUNTER — Other Ambulatory Visit: Payer: Self-pay

## 2019-11-18 ENCOUNTER — Ambulatory Visit (INDEPENDENT_AMBULATORY_CARE_PROVIDER_SITE_OTHER): Payer: Medicare Other | Admitting: Cardiothoracic Surgery

## 2019-11-18 ENCOUNTER — Ambulatory Visit
Admission: RE | Admit: 2019-11-18 | Discharge: 2019-11-18 | Disposition: A | Discharge status: Home / Self Care | Payer: Medicare Other | Attending: Cardiothoracic Surgery | Admitting: Cardiothoracic Surgery

## 2019-11-18 ENCOUNTER — Ambulatory Visit
Admission: RE | Admit: 2019-11-18 | Discharge: 2019-11-18 | Disposition: A | Discharge status: Home / Self Care | Payer: Medicare Other | Source: Ambulatory Visit | Attending: Cardiothoracic Surgery | Admitting: Cardiothoracic Surgery

## 2019-11-18 VITALS — BP 144/81 | HR 67 | Temp 98.0°F | Ht 63.0 in | Wt 110.0 lb

## 2019-11-18 DIAGNOSIS — R911 Solitary pulmonary nodule: Secondary | ICD-10-CM | POA: Insufficient documentation

## 2019-11-18 NOTE — Progress Notes (Signed)
  Doing much better.  Not short of breath.  Eating well.  Still with some constipation.  Wounds clean and dry.  Sutures/staples removed. Lungs clear Heart reg  CXRay from today looks good  Will see Dr. Fabienne Bruns next week Will see me in 2 weeks

## 2019-11-18 NOTE — Patient Instructions (Addendum)
Dr Genevive Bi removed patient's staples at today's visit and applied steri strips to the area. Patient may resume with showering.  Patient will follow up in 2 weeks with Dr Genevive Bi with a chest xray prior to scheduled appointment.  GENERAL POST-OPERATIVE PATIENT INSTRUCTIONS   WOUND CARE INSTRUCTIONS:  Keep a dry clean dressing on the wound if there is drainage. The initial bandage may be removed after 24 hours.  Once the wound has quit draining you may leave it open to air.  If clothing rubs against the wound or causes irritation and the wound is not draining you may cover it with a dry dressing during the daytime.  Try to keep the wound dry and avoid ointments on the wound unless directed to do so.  If the wound becomes bright red and painful or starts to drain infected material that is not clear, please contact your physician immediately.  If the wound is mildly pink and has a thick firm ridge underneath it, this is normal, and is referred to as a healing ridge.  This will resolve over the next 4-6 weeks.  BATHING: You may shower if you have been informed of this by your surgeon. However, Please do not submerge in a tub, hot tub, or pool until incisions are completely sealed or have been told by your surgeon that you may do so.  DIET:  You may eat any foods that you can tolerate.  It is a good idea to eat a high fiber diet and take in plenty of fluids to prevent constipation.  If you do become constipated you may want to take a mild laxative or take ducolax tablets on a daily basis until your bowel habits are regular.  Constipation can be very uncomfortable, along with straining, after recent surgery.  ACTIVITY:  You are encouraged to cough and deep breath or use your incentive spirometer if you were given one, every 15-30 minutes when awake.  This will help prevent respiratory complications and low grade fevers post-operatively if you had a general anesthetic.  You may want to hug a pillow when coughing and  sneezing to add additional support to the surgical area, if you had abdominal or chest surgery, which will decrease pain during these times.  You are encouraged to walk and engage in light activity for the next two weeks.  You should not lift more than 20 pounds, until 4 to 6 weeks after surgery as it could put you at increased risk for complications.  Twenty pounds is roughly equivalent to a plastic bag of groceries. At that time- Listen to your body when lifting, if you have pain when lifting, stop and then try again in a few days. Soreness after doing exercises or activities of daily living is normal as you get back in to your normal routine.  MEDICATIONS:  Try to take narcotic medications and anti-inflammatory medications, such as tylenol, ibuprofen, naprosyn, etc., with food.  This will minimize stomach upset from the medication.  Should you develop nausea and vomiting from the pain medication, or develop a rash, please discontinue the medication and contact your physician.  You should not drive, make important decisions, or operate machinery when taking narcotic pain medication.  SUNBLOCK Use sun block to incision area over the next year if this area will be exposed to sun. This helps decrease scarring and will allow you avoid a permanent darkened area over your incision.  QUESTIONS:  Please feel free to call our office if you have any  questions, and we will be glad to assist you. [phone].   Wound Care, Adult Taking care of your wound properly can help to prevent pain, infection, and scarring. It can also help your wound to heal more quickly. How to care for your wound Wound care      Follow instructions from your health care provider about how to take care of your wound. Make sure you: ? Wash your hands with soap and water before you change the bandage (dressing). If soap and water are not available, use hand sanitizer. ? Change your dressing as told by your health care  provider. ? Leave stitches (sutures), skin glue, or adhesive strips in place. These skin closures may need to stay in place for 2 weeks or longer. If adhesive strip edges start to loosen and curl up, you may trim the loose edges. Do not remove adhesive strips completely unless your health care provider tells you to do that.  Check your wound area every day for signs of infection. Check for: ? Redness, swelling, or pain. ? Fluid or blood. ? Warmth. ? Pus or a bad smell.  Ask your health care provider if you should clean the wound with mild soap and water. Doing this may include: ? Using a clean towel to pat the wound dry after cleaning it. Do not rub or scrub the wound. ? Applying a cream or ointment. Do this only as told by your health care provider. ? Covering the incision with a clean dressing.  Ask your health care provider when you can leave the wound uncovered.  Keep the dressing dry until your health care provider says it can be removed. Do not take baths, swim, use a hot tub, or do anything that would put the wound underwater until your health care provider approves. Ask your health care provider if you can take showers. You may only be allowed to take sponge baths. Medicines   If you were prescribed an antibiotic medicine, cream, or ointment, take or use the antibiotic as told by your health care provider. Do not stop taking or using the antibiotic even if your condition improves.  Take over-the-counter and prescription medicines only as told by your health care provider. If you were prescribed pain medicine, take it 30 or more minutes before you do any wound care or as told by your health care provider. General instructions  Return to your normal activities as told by your health care provider. Ask your health care provider what activities are safe.  Do not scratch or pick at the wound.  Do not use any products that contain nicotine or tobacco, such as cigarettes and  e-cigarettes. These may delay wound healing. If you need help quitting, ask your health care provider.  Keep all follow-up visits as told by your health care provider. This is important.  Eat a diet that includes protein, vitamin A, vitamin C, and other nutrient-rich foods to help the wound heal. ? Foods rich in protein include meat, dairy, beans, nuts, and other sources. ? Foods rich in vitamin A include carrots and dark green, leafy vegetables. ? Foods rich in vitamin C include citrus, tomatoes, and other fruits and vegetables. ? Nutrient-rich foods have protein, carbohydrates, fat, vitamins, or minerals. Eat a variety of healthy foods including vegetables, fruits, and whole grains. Contact a health care provider if:  You received a tetanus shot and you have swelling, severe pain, redness, or bleeding at the injection site.  Your pain is not controlled  with medicine.  You have redness, swelling, or pain around the wound.  You have fluid or blood coming from the wound.  Your wound feels warm to the touch.  You have pus or a bad smell coming from the wound.  You have a fever or chills.  You are nauseous or you vomit.  You are dizzy. Get help right away if:  You have a red streak going away from your wound.  The edges of the wound open up and separate.  Your wound is bleeding, and the bleeding does not stop with gentle pressure.  You have a rash.  You faint.  You have trouble breathing. Summary  Always wash your hands with soap and water before changing your bandage (dressing).  To help with healing, eat foods that are rich in protein, vitamin A, vitamin C, and other nutrients.  Check your wound every day for signs of infection. Contact your health care provider if you suspect that your wound is infected. This information is not intended to replace advice given to you by your health care provider. Make sure you discuss any questions you have with your health care  provider. Document Revised: 04/08/2018 Document Reviewed: 07/06/2015 [employer] Patient Education  [place].

## 2019-11-20 ENCOUNTER — Other Ambulatory Visit: Payer: Self-pay

## 2019-11-20 ENCOUNTER — Inpatient Hospital Stay: Payer: Medicare Other

## 2019-11-20 ENCOUNTER — Inpatient Hospital Stay: Payer: Medicare Other | Attending: Oncology | Admitting: Oncology

## 2019-11-20 VITALS — BP 112/98 | HR 81 | Temp 98.6°F | Resp 16 | Wt 108.3 lb

## 2019-11-20 DIAGNOSIS — I739 Peripheral vascular disease, unspecified: Secondary | ICD-10-CM

## 2019-11-20 DIAGNOSIS — C3411 Malignant neoplasm of upper lobe, right bronchus or lung: Secondary | ICD-10-CM

## 2019-11-20 DIAGNOSIS — Z9889 Other specified postprocedural states: Secondary | ICD-10-CM

## 2019-11-20 DIAGNOSIS — Z7189 Other specified counseling: Secondary | ICD-10-CM

## 2019-11-20 DIAGNOSIS — Z122 Encounter for screening for malignant neoplasm of respiratory organs: Secondary | ICD-10-CM

## 2019-11-20 DIAGNOSIS — I2510 Atherosclerotic heart disease of native coronary artery without angina pectoris: Secondary | ICD-10-CM | POA: Diagnosis not present

## 2019-11-20 DIAGNOSIS — R911 Solitary pulmonary nodule: Secondary | ICD-10-CM

## 2019-11-23 ENCOUNTER — Encounter: Payer: Self-pay | Admitting: Oncology

## 2019-11-23 DIAGNOSIS — C3411 Malignant neoplasm of upper lobe, right bronchus or lung: Secondary | ICD-10-CM | POA: Insufficient documentation

## 2019-11-23 DIAGNOSIS — Z7189 Other specified counseling: Secondary | ICD-10-CM | POA: Insufficient documentation

## 2019-11-23 NOTE — Progress Notes (Signed)
Hematology/Oncology Consult note [facility] [phone] [phone]  Patient Care Team: Kirk Ruths, MD as PCP - General (Internal Medicine) Telford Nab, RN as Oncology Nurse Navigator   Name of the patient: Ann [name]  [id number]  January 09, 1955    Reason for referral-lung cancer s/p surgery   Referring physician- Dr. Lanney Gins  Date of visit: 11/23/19   History of presenting illness- Patient is a 64 year old female with a past medical history significant for tobacco dependence, peripheral vascular disease and coronary artery disease.  She underwent CT lung cancer screening protocol in September 2021 which showed a suspicious right upper lobe lung nodule 10.3 mm in size.  This was followed by a PET scan which showed hypermetabolic spiculated right upper lobe lung nodule with an SUV of 7 T1 8 with no evidence of locoregional adenopathy or distant metastatic disease.  Incidental finding of an enlarged right lobe of the thyroid gland.  Intense metabolic activity at the level of distal rectum/anus favored physiologic  Patient was evaluated by Dr. 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  No evidence of visceral pleural invasion.  There was another tumor also in the upper lobe of the lung 0.8 cm which was adenocarcinoma in situ nonmucinous.  Margins negative lymph node sampling of 7 nodes did not show any evidence of malignancy pT1b pN0  Patient had pleural effusion post surgery which was tapped and was negative for malignancy.  Patient is still recovering from her surgery and reports  Significant pain at the site of surgery.  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  She is  following up with Dr. Genevive Bi for the same.   ECOG PS- 1  Pain scale- 4   Review of systems- Review of Systems  Constitutional: Negative for chills, fever, malaise/fatigue and weight loss.  HENT: Negative for congestion, ear discharge and nosebleeds.   Eyes: Negative for blurred vision.  Respiratory: Negative for cough, hemoptysis, sputum production, shortness of breath and wheezing.        Right chest wall pain  Cardiovascular: Negative for chest pain, palpitations, orthopnea and claudication.  Gastrointestinal: Negative for abdominal pain, blood in stool, constipation, diarrhea, heartburn, melena, nausea and vomiting.  Genitourinary: Negative for dysuria, flank pain, frequency, hematuria and urgency.  Musculoskeletal: Negative for back pain, joint pain and myalgias.  Skin: Negative for rash.  Neurological: Negative for dizziness, tingling, focal weakness, seizures, weakness and headaches.  Endo/Heme/Allergies: Does not bruise/bleed easily.  Psychiatric/Behavioral: Negative for depression and suicidal ideas. The patient does not have insomnia.     Allergies  Allergen Reactions  . Clopidogrel Anaphylaxis and Hives    Patient Active Problem List   Diagnosis Date Noted  . Ischemia of extremity 11/14/2019  . Dyspnea 11/13/2019  . Pleural effusion 11/13/2019  . Hypokalemia 11/05/2019  . HLD (hyperlipidemia) 11/05/2019  . COPD (chronic obstructive pulmonary disease) ([place]) 11/05/2019  . Stroke ([place]) 11/05/2019  . Leukocytosis 11/05/2019  . Lung mass 11/04/2019  . Vasovagal syncope 09/23/2018  . H/O right coronary artery stent placement 09/23/2018  . Elevated hemoglobin A1c 09/04/2018  . Tobacco use 07/14/2016  . Status post lumbar surgery 07/14/2016  . HTN, goal below 140/90 07/14/2016  . Healthcare maintenance 07/14/2016  . Coronary artery  disease involving native coronary artery of native heart without angina pectoris 07/14/2016     Past Medical History:  Diagnosis Date    . COPD (chronic obstructive pulmonary disease) ([place])   . Coronary artery disease    Prior stenting  . Hypertension Hypoxic  . Hypokalemia   . Multiple thyroid nodules   . Myocardial infarction ([place])   . Stroke [facility]) 2005   TIA  . Syncope      Past Surgical History:  Procedure Laterality Date  . BACK SURGERY     DECOMRESSION L4-5 WITH RODS AND FUSION  . CAROTID STENT  07/18/2003  . NECK SURGERY    . THORACOTOMY/LOBECTOMY Right 11/04/2019   Procedure: THORACOTOMY/LOBECTOMY;  Surgeon: Nestor Lewandowsky, MD;  Location: [facility] ORS;  Service: Thoracic;  Laterality: Right;  . TONSILLECTOMY    . TUBAL LIGATION      Social History   Socioeconomic History  . Marital status: Married    Spouse name: Not on file  . Number of children: Not on file  . Years of education: Not on file  . Highest education level: Not on file  Occupational History  . Not on file  Tobacco Use  . Smoking status: Former Smoker    Packs/day: 0.25    Years: 49.00    Pack years: 12.25    Types: Cigarettes    Quit date: 10/17/2019    Years since quitting: 0.1  . Smokeless tobacco: Never Used  . Tobacco comment: 1 pack lasts 3-4 days  Vaping Use  . Vaping Use: Never used  Substance and Sexual Activity  . Alcohol use: Yes    Comment: 1 shot of vodka with lemonade every evening  . Drug use: Never  . Sexual activity: Not on file  Other Topics Concern  . Not on file  Social History Narrative  . Not on file   Social Determinants of Health   Financial Resource Strain:   . Difficulty of Paying Living Expenses: Not on file  Food Insecurity:   . Worried About Charity fundraiser in the Last Year: Not on file  . Ran Out of Food in the Last Year: Not on file  Transportation Needs:   . Lack of Transportation (Medical): Not on file  . Lack of Transportation (Non-Medical): Not on file  Physical Activity:   . Days of Exercise per Week: Not on file  . Minutes of Exercise per Session: Not on file  Stress:   .  Feeling of Stress : Not on file  Social Connections:   . Frequency of Communication with Friends and Family: Not on file  . Frequency of Social Gatherings with Friends and Family: Not on file  . Attends Religious Services: Not on file  . Active Member of Clubs or Organizations: Not on file  . Attends Archivist Meetings: Not on file  . Marital Status: Not on file  Intimate Partner Violence:   . Fear of Current or Ex-Partner: Not on file  . Emotionally Abused: Not on file  . Physically Abused: Not on file  . Sexually Abused: Not on file     Family History  Problem Relation Age of Onset  . Alzheimer's disease Mother   . Heart attack Father   . Diabetes Mellitus II Brother   . Diabetes Mellitus II Brother      Current Outpatient Medications:  .  aspirin 325 MG tablet, Take 325 mg by mouth daily. , Disp: , Rfl:  .  fluticasone (  FLONASE) 50 MCG/ACT nasal spray, Place 2 sprays into both nostrils daily as needed for allergies. , Disp: , Rfl:  .  furosemide (LASIX) 20 MG tablet, Take 20 mg by mouth daily as needed for edema. , Disp: , Rfl:  .  ibuprofen (ADVIL) 600 MG tablet, Take 1 tablet (600 mg total) by mouth every 6 (six) hours as needed., Disp: 30 tablet, Rfl: 0 .  lisinopril-hydrochlorothiazide (ZESTORETIC) 20-12.5 MG tablet, Take 1 tablet by mouth daily., Disp: , Rfl:  .  methocarbamol (ROBAXIN) 500 MG tablet, Take 1 tablet (500 mg total) by mouth every 8 (eight) hours as needed for muscle spasms., Disp: 30 tablet, Rfl: 0 .  metoprolol tartrate (LOPRESSOR) 25 MG tablet, Take 25 mg by mouth 2 (two) times daily., Disp: , Rfl:  .  oxyCODONE (OXY IR/ROXICODONE) 5 MG immediate release tablet, Take 1 tablet (5 mg total) by mouth every 6 (six) hours as needed for severe pain or breakthrough pain., Disp: 20 tablet, Rfl: 0 .  spironolactone (ALDACTONE) 25 MG tablet, Take 25 mg by mouth daily. , Disp: , Rfl:  .  TRELEGY ELLIPTA 100-62.5-25 MCG/INH AEPB, Inhale 1 puff into the lungs  daily., Disp: , Rfl:    Physical exam:  Vitals:   11/20/19 1511  BP: (!) 112/98  Pulse: 81  Resp: 16  Temp: 98.6 F (37 C)  TempSrc: Tympanic  SpO2: 99%  Weight: 108 lb 4.8 oz (49.1 kg)   Physical Exam Constitutional:      General: She is not in acute distress.    Comments: Appears fatigued  Eyes:     Pupils: Pupils are equal, round, and reactive to light.  Cardiovascular:     Rate and Rhythm: Normal rate and regular rhythm.     Heart sounds: Normal heart sounds.  Pulmonary:     Effort: Pulmonary effort is normal.     Breath sounds: Normal breath sounds.     Comments: Steri-Strips in place in the region of recent right upper lobectomy Abdominal:     General: Bowel sounds are normal.     Palpations: Abdomen is soft.  Musculoskeletal:     Cervical back: Normal range of motion.  Skin:    General: Skin is warm and dry.  Neurological:     Mental Status: She is alert and oriented to person, place, and time.        CMP Latest Ref Rng & Units 11/13/2019  Glucose 70 - 99 mg/dL 100(H)  BUN 8 - 23 mg/dL 10  Creatinine 0.44 - 1.00 mg/dL 0.53  Sodium 135 - 145 mmol/L 141  Potassium 3.5 - 5.1 mmol/L 3.3(L)  Chloride 98 - 111 mmol/L 106  CO2 22 - 32 mmol/L 25  Calcium 8.9 - 10.3 mg/dL 8.6(L)  Total Protein 6.5 - 8.1 g/dL 7.1  Total Bilirubin 0.3 - 1.2 mg/dL 0.5  Alkaline Phos 38 - 126 U/L 74  AST 15 - 41 U/L 36  ALT 0 - 44 U/L 31   CBC Latest Ref Rng & Units 11/13/2019  WBC 4.0 - 10.5 K/uL 9.2  Hemoglobin 12.0 - 15.0 g/dL 12.7  Hematocrit 36 - 46 % 39.3  Platelets 150 - 400 K/uL 483(H)    No images are attached to the encounter.  DG Chest 2 View  Result Date: 11/18/2019 CLINICAL DATA:  64 year old female status post Right lobectomy on 11/04/19 for Lung Cancer. EXAM: CHEST - 2 VIEW COMPARISON:  Chest CTA 11/13/2019 and earlier. FINDINGS: PA and lateral views. Left  chest cardiac event recorder type device again noted. Stable cardiac size and mediastinal contours.  Right pleural effusion has regressed and possibly resolved from the prior CTA. Stable reduction in the right lung volume. Right lateral chest wall skin staples again noted. No pneumothorax identified. No pulmonary edema or confluent pulmonary opacity. No acute osseous abnormality identified. Prior cervical ACDF. Partially visible lumbar spine hardware. Negative visible bowel gas pattern. IMPRESSION: 1. Regressed and possibly resolved right pleural effusion since 11/13/2019. 2. Otherwise satisfactory postoperative appearance of the right chest. 3. No new cardiopulmonary abnormality. Electronically Signed   By: Genevie Ann M.D.   On: 11/18/2019 15:17   DG Chest 2 View  Result Date: 11/08/2019 CLINICAL DATA:  Chest tube in place EXAM: CHEST - 2 VIEW COMPARISON:  11/06/2019 chest radiograph. FINDINGS: Loop recorder overlies the left heart. Partially visualized surgical hardware from ACDF. Apical and basilar right chest tubes are stable in position. Skin staples overlie the lower outer right chest. Stable cardiomediastinal silhouette with top-normal heart size. No pneumothorax. No pleural effusion. No pulmonary edema. Mild left basilar atelectasis, improved. IMPRESSION: 1. No pneumothorax. Stable right chest tubes. 2. Mild left basilar atelectasis, improved. Electronically Signed   By: Ilona Sorrel M.D.   On: 11/08/2019 09:04   Chest 2 View  Result Date: 11/02/2019 CLINICAL DATA:  Pre op images, surgery on Nov 2nd. COPD, hypertension, myocardial infarction. Former smoker EXAM: CHEST - 2 VIEW COMPARISON:  Chest CT, 09/23/2019 FINDINGS: Heart is mildly enlarged. Left coronary artery stent. No mediastinal or hilar masses. No evidence of adenopathy. Lungs are hyperexpanded, but clear. No pleural effusion or pneumothorax. Skeletal structures are intact. IMPRESSION: No active cardiopulmonary disease. Electronically Signed   By: Lajean Manes M.D.   On: 11/02/2019 04:02   DG Abd 1 View  Result Date:  11/13/2019 CLINICAL DATA:  Constipation.  Syncope. EXAM: ABDOMEN - 1 VIEW COMPARISON:  11/10/2019 FINDINGS: Gas in nondilated large and small bowel loops shows interval improvement particularly in the small bowel gas. Pedicle screw fusion L4-5. Bilateral fallopian tube clips. Atherosclerotic calcification aorta and iliac arteries. No acute skeletal abnormality. IMPRESSION: Nonobstructive bowel gas pattern. Improvement in small bowel gas since the prior study which may reflect resolving small-bowel obstruction or ileus. Electronically Signed   By: Franchot Gallo M.D.   On: 11/13/2019 14:27   CT ANGIO NECK W OR WO CONTRAST  Result Date: 11/14/2019 CLINICAL DATA:  Carotid artery stenosis workup EXAM: CT ANGIOGRAPHY NECK TECHNIQUE: Multidetector CT imaging of the neck was performed using the standard protocol during bolus administration of intravenous contrast. Multiplanar CT image reconstructions and MIPs were obtained to evaluate the vascular anatomy. Carotid stenosis measurements (when applicable) are obtained utilizing NASCET criteria, using the distal internal carotid diameter as the denominator. CONTRAST:  27mL OMNIPAQUE IOHEXOL 350 MG/ML SOLN COMPARISON:  Chest CTA from yesterday FINDINGS: Aortic arch: Atheromatous wall thickening of the arch. Three vessel branching. Right carotid system: Diffuse calcified plaque along the common carotid with bulky calcified plaque along the medial carotid bulb. No stenosis of 50% or greater. No ulceration or beading. Left carotid system: Limited assessment of the left common carotid artery due to streak artifact from intravenous contrast. There is diminished flow at the under filled left common carotid which was also seen on prior chest CT, although there is some luminal opacification faintly seen to the level of the ICA bulb. Bulky calcified plaque is present at the carotid bifurcation. Left ECA branches are filled from collaterals in the arterial phase.  Vertebral  arteries: Atheromatous plaque the subclavian arteries. Evaluation of the left subclavian and axillary arteries is not possible due to the degree of streak artifact. Both vertebral arteries are smooth and widely patent into the dura. Skeleton: C4-5, C5-6, and C6-7 ACDF with solid arthrodesis. Dental caries. Other neck: No incidental inflammation or adenopathy seen in the neck. Asymmetric larger right lobe thyroid. Thyroid ultrasound was performed 10/21/2019 at outside facility. Upper chest: Emphysema. IMPRESSION: 1. Limited assessment of the proximal left common carotid artery due to streak artifact from intravenous contrast. 2. Implied high-grade proximal left CCA stenosis with faint opacification of the left common carotid and proximal ICA. The left ICA appears occluded by the skull base, but a small channel could become apparent in the delayed phase. 3. Multifocal atheromatous plaque of the right carotid without flow reducing stenosis. Electronically Signed   By: Monte Fantasia M.D.   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 Electronically Signed   By: Constance Holster M.D.   On: 11/13/2019 21:11   Result Date: 11/13/2019 CLINICAL DATA:  Recent biopsy. EXAM: CT ANGIOGRAPHY CHEST WITH CONTRAST TECHNIQUE: Multidetector CT imaging of the chest was performed using the standard protocol during bolus administration of intravenous contrast. Multiplanar CT image reconstructions and MIPs were obtained to evaluate the vascular anatomy. CONTRAST:  27mL OMNIPAQUE IOHEXOL 350 MG/ML SOLN COMPARISON:  CT chest dated 09/23/2019.  PET-CT dated 10/02/2019 FINDINGS: Cardiovascular: Contrast injection is sufficient to demonstrate satisfactory opacification of the pulmonary arteries to the segmental level.  There is no pulmonary embolus or evidence of right heart strain. The size of the main pulmonary artery is normal. Cardiomegaly with coronary artery calcification. The course and caliber of the aorta are normal. There is mild atherosclerotic calcification. Opacification decreased due to pulmonary arterial phase contrast bolus timing. 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 Mediastinum/Nodes: -- No mediastinal lymphadenopathy. -- No hilar lymphadenopathy. -- No axillary lymphadenopathy. -- No supraclavicular lymphadenopathy. --thyroid gland is enlarged with multiple thyroid nodules. This was previously evaluated by ultrasound. -  Unremarkable esophagus. Lungs/Pleura: The patient has undergone interval right upper lobe lobectomy. There is a trace right-sided pneumothorax. There is a moderate right-sided pleural effusion. Emphysematous changes are noted bilaterally. There is atelectasis at the lung bases. There is a drainage catheter in the patient's right flank. Upper Abdomen: Contrast bolus timing is not optimized for evaluation of the abdominal organs. The visualized portions of the organs of the upper abdomen are normal. Musculoskeletal: There is an acute, minimally displaced fracture involving the anterolateral fifth rib on the right. No other additional acute displaced fracture identified on this study. There are postsurgical changes along the patient's right flank with a few pockets of subcutaneous gas and associated soft tissue edema. Review of the MIP images confirms the above findings. IMPRESSION: 1. No evidence of acute pulmonary embolus. 2. Status post right upper lobe lobectomy. 3. Trace right-sided pneumothorax. There is a drainage catheter situated in the soft tissues of the right flank. It is not clear if this catheter is well positioned or represents a malpositioned right-sided chest tube. Correlation with the patient's surgical history is recommended. None of this tube is  currently located within the thoracic cavity. 4. Moderate right-sided pleural effusion. 5. Bibasilar atelectasis. 6. Cardiomegaly  with coronary artery disease. Aortic Atherosclerosis (ICD10-I70.0) and Emphysema (ICD10-J43.9). Electronically Signed: By: Constance Holster M.D. On: 11/13/2019 03:48   DG Chest Port 1 View  Result Date: 11/13/2019 CLINICAL DATA:  Status post right thoracentesis EXAM: PORTABLE CHEST 1 VIEW COMPARISON:  11/13/2019 FINDINGS: Cardiac shadow is within normal limits. Loop recorder is again noted. Small residual right-sided effusion is noted likely related to the septation encountered on recent thoracentesis. Multiple right rib fractures are seen as well as some pleural based density similar to that seen on recent CT. No pneumothorax is noted. The previously placed chest tube is noted coiled within the chest wall extrinsic to the ribcage. IMPRESSION: Minimal residual pleural effusion on the right with no evidence of pneumothorax. Electronically Signed   By: Inez Catalina M.D.   On: 11/13/2019 15:41   DG Chest Port 1 View  Result Date: 11/13/2019 CLINICAL DATA:  Dyspnea EXAM: PORTABLE CHEST 1 VIEW COMPARISON:  11/09/2019, CT 09/23/2019 FINDINGS: Lung volumes are small. Surgical staple line noted at the right lung base with associated right-sided volume loss in keeping with partial right lung resection. There is a nodular pleural based density noted just superior to the staple line, similar to that noted on prior examination, which may represent a a laterally loculated pleural effusion or fluid within the fissure given its relatively rapid development. The lungs are otherwise clear. No pneumothorax. No pleural effusion on the left. Cardiac size within normal limits. Implanted loop recorder again noted. Surgical drain noted within the right breast with associated surgical skin staples. No acute bone abnormality. IMPRESSION: Stable examination. Pleural based opacity likely represents  small loculated pleural fluid. No acute disease identified. Electronically Signed   By: Fidela Salisbury MD   On: 11/13/2019 02:20   DG Chest Port 1 View  Result Date: 11/09/2019 CLINICAL DATA:  RIGHT chest tube removal EXAM: PORTABLE CHEST 1 VIEW COMPARISON:  Portable exam 1535 hours compared to 0706 hours FINDINGS: Interval removal of thoracostomy tube. Additional surgical drain projects over the lower lateral RIGHT chest wall. Loop recorder projects over LEFT heart. Upper normal size of cardiac silhouette. Mediastinal contours and pulmonary vascularity normal. Atherosclerotic calcification aorta. Postsurgical changes RIGHT mid lung with subsegmental atelectasis at LEFT base. Small RIGHT apical pneumothorax post thoracostomy tube removal. No LEFT pneumothorax. Fracture of lateral RIGHT fifth rib. IMPRESSION: Small RIGHT apex pneumothorax following thoracostomy tube removal. Subsegmental atelectasis LEFT base. Fracture of lateral RIGHT fifth rib. Findings called to [place] on [place] on 11/07/201 at 1840 hrs. Findings called to Dr. Dahlia Byes on 11/09/2019 at [place] hrs. Electronically Signed   By: Lavonia Dana M.D.   On: 11/09/2019 19:09   DG CHEST PORT 1 VIEW  Result Date: 11/09/2019 CLINICAL DATA:  Status post right lobectomy, 1 right chest tube removed EXAM: PORTABLE CHEST 1 VIEW COMPARISON:  None. FINDINGS: The previous right chest tube extending to the apex has been removed. Stable right lower chest surgical drain and residual basilar chest tube. Stable aeration with postoperative changes noted and right hemithorax volume loss. No developing effusion or significant pneumothorax. Stable heart size and vascularity. Stable left lung aeration. Loop recorder device over the left cardiac border. Trachea midline. Aorta atherosclerotic. Similar gaseous distention of the bowel in the epigastric region. IMPRESSION: Stable postoperative findings. Removal of 1 of the right chest tubes. No effusion or pneumothorax.  Electronically Signed   By: Jerilynn Mages.  Shick M.D.   On: 11/09/2019 14:54   DG Chest Port 1 View  Result Date: 11/06/2019  CLINICAL DATA:  Postop check. EXAM: PORTABLE CHEST 1 VIEW COMPARISON:  11/04/2019. FINDINGS: Two right chest tubes in stable position. No pneumothorax identified. Cardiac monitor device in stable position. Stable cardiomegaly. Stable left base atelectasis. Postsurgical changes right lung. Mild right chest wall subcutaneous emphysema again noted. Prior cervical fusion. IMPRESSION: 1. Two right chest tubes in stable position. No pneumothorax identified. Stable right chest wall mild subcutaneous emphysema. 2. Stable left base atelectasis. Stable postsurgical changes right chest. 3. Stable cardiomegaly. Electronically Signed   By: Marcello Moores  Register   On: 11/06/2019 05:32   DG Chest Port 1 View  Result Date: 11/04/2019 CLINICAL DATA:  Postoperative check EXAM: PORTABLE CHEST 1 VIEW COMPARISON:  Portable exam 1220 hours compared to 10/31/2019 FINDINGS: RIGHT thoracostomy tubes new since prior exam. Loop recorder and coronary stent project over heart. Minimal enlargement of cardiac silhouette with slight vascular congestion. Atherosclerotic calcifications aorta. Lungs clear. Postsurgical changes RIGHT hemithorax. No pleural effusion or pneumothorax. IMPRESSION: Postsurgical changes of the RIGHT hemithorax. No acute abnormalities. Aortic Atherosclerosis (ICD10-I70.0). Electronically Signed   By: Lavonia Dana M.D.   On: 11/04/2019 12:47   DG ABD ACUTE 2+V W 1V CHEST  Result Date: 11/10/2019 CLINICAL DATA:  Syncope, ileus. EXAM: DG ABDOMEN ACUTE WITH 1 VIEW CHEST COMPARISON:  November 09, 2019. FINDINGS: There is no evidence of dilated bowel loops or free intraperitoneal air. No radiopaque calculi or other significant radiographic abnormality is seen. Heart size and mediastinal contours are within normal limits. Stable small right apical pneumothorax is noted. Stable bibasilar subsegmental atelectasis is  noted. IMPRESSION: Stable small right apical pneumothorax. Stable bibasilar subsegmental atelectasis. No evidence of bowel obstruction or ileus. Electronically Signed   By: Marijo Conception M.D.   On: 11/10/2019 08:07   CUP PACEART REMOTE DEVICE CHECK  Result Date: 11/10/2019 ILR summary report received. Battery status OK. Normal device function. No new symptom, tachy, brady, or pause episodes. No new AF episodes. Monthly summary reports and ROV/PRN  US THORACENTESIS ASP PLEURAL SPACE W/IMG GUIDE  Result Date: 11/13/2019 INDICATION: Right-sided pleural effusion EXAM: ULTRASOUND GUIDED RIGHT THORACENTESIS MEDICATIONS: None. COMPLICATIONS: None immediate. PROCEDURE: An ultrasound guided thoracentesis was thoroughly discussed with the patient and questions answered. The benefits, risks, alternatives and complications were also discussed. The patient understands and wishes to proceed with the procedure. Written consent was obtained. Ultrasound was performed to localize and mark an adequate pocket of fluid in the right chest. The area was then prepped and draped in the normal sterile fashion. 1% Lidocaine was used for local anesthesia. Under ultrasound guidance a 6 Fr Safe-T-Centesis catheter was introduced. Thoracentesis was performed. The catheter was removed and a dressing applied. FINDINGS: A total of approximately 300 mL of bloody fluid was removed. Samples were sent to the laboratory as requested by the clinical team. IMPRESSION: Successful ultrasound guided right thoracentesis yielding 300 mL of pleural fluid. Some mild septation was fluid. Electronically Signed   By: Inez Catalina M.D.   On: 11/13/2019 15:35    Assessment and plan- Patient is a 64 y.o. 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  7 sampled lymph nodes were negative for malignancy.  For stage Ia lung cancer patient  does not require any adjuvant chemotherapy or radiation at this time.  We can observe this with repeat surveillance scans after 6 months.  Surgery was done with a curative intent.  I will see her back in 6 months following CT scans   Thank you for this kind referral and the opportunity to participate in the care of this  Patient   Visit Diagnosis 1. Malignant neoplasm of upper lobe of right lung ([place])   2. Screening for malignant neoplasm of respiratory organ   3. Goals of care, counseling/discussion     Dr. Randa Evens, MD, MPH [facility] [phone] 11/23/2019 4:11 PM

## 2019-11-24 ENCOUNTER — Ambulatory Visit
Admission: RE | Admit: 2019-11-24 | Discharge: 2019-11-24 | Disposition: A | Discharge status: Home / Self Care | Payer: Medicare Other | Attending: Cardiothoracic Surgery | Admitting: Cardiothoracic Surgery

## 2019-11-24 ENCOUNTER — Other Ambulatory Visit: Payer: Self-pay

## 2019-11-24 ENCOUNTER — Encounter: Payer: Self-pay | Admitting: *Deleted

## 2019-11-24 ENCOUNTER — Ambulatory Visit (INDEPENDENT_AMBULATORY_CARE_PROVIDER_SITE_OTHER): Payer: Medicare Other | Admitting: Cardiothoracic Surgery

## 2019-11-24 ENCOUNTER — Ambulatory Visit
Admission: RE | Admit: 2019-11-24 | Discharge: 2019-11-24 | Disposition: A | Discharge status: Home / Self Care | Payer: Medicare Other | Source: Ambulatory Visit | Attending: Cardiothoracic Surgery | Admitting: Cardiothoracic Surgery

## 2019-11-24 ENCOUNTER — Encounter: Payer: Self-pay | Admitting: Cardiothoracic Surgery

## 2019-11-24 VITALS — BP 138/89 | HR 65 | Temp 98.5°F | Ht 63.0 in | Wt 109.4 lb

## 2019-11-24 DIAGNOSIS — Z9889 Other specified postprocedural states: Secondary | ICD-10-CM | POA: Insufficient documentation

## 2019-11-24 DIAGNOSIS — R911 Solitary pulmonary nodule: Secondary | ICD-10-CM

## 2019-11-24 NOTE — Patient Instructions (Signed)
Stop the Robaxin. Stop the ibuprofen.Please pick up the medication at the pharmacy. Try magnesium citrate for your constipation. Also take Miralax daily for constipation if needed.

## 2019-11-24 NOTE — Discharge Summary (Addendum)
Physician Discharge Summary  Patient ID: Ann [name] MRN: [id number] DOB/AGE: 1955/03/28 64 y.o.  Admit date: 11/13/2019 Discharge date: 11/14/19  Admission Diagnoses: SOB  Discharge Diagnoses:  Same as above plus left carotid artery occulsion  Discharged Condition: good  Hospital Course: admitted for above. S/p right thoracotomy and right upper lobectomyfor right upper lobe masses x2.  Path showed + invasive adenocarcinoma, lymph nodes negative x2. Returns to hospital after complaints above.  Underwent thoracentesis which resolved the SOB. Also had constipation issues which resolved prior to d/c.  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  Vascular consulted and they recommended outpt management.  Cleared from pulm standpoint as well.  Consults: pulmonary/intensive care and vascular surgery  Discharge Exam: Blood pressure (!) 132/92, pulse 83, temperature 98.7 F (37.1 C), temperature source Oral, resp. rate 18, height 5\' 2"  (1.575 m), weight 48.9 kg, SpO2 100 %. General appearance: alert, cooperative and no distress  Disposition:  Discharge disposition: 01-Home or Self Care       Discharge Instructions    Discharge patient   Complete by: As directed    Discharge disposition: 01-Home or Self Care   Discharge patient date: 11/14/2019     Allergies as of 11/14/2019      Reactions   Clopidogrel Anaphylaxis, Hives      Medication List    STOP taking these medications   rosuvastatin 20 MG tablet Commonly known as: CRESTOR     TAKE these medications   aspirin 325 MG tablet Take 325 mg by mouth daily.   fluticasone 50 MCG/ACT nasal spray Commonly known as: FLONASE Place 2 sprays into both nostrils daily as needed for allergies.   furosemide 20 MG tablet Commonly known as: LASIX Take 20 mg by mouth daily as needed for edema.   ibuprofen 600 MG tablet Commonly known as: ADVIL Take 1 tablet (600 mg total) by mouth every 6 (six) hours as needed.    methocarbamol 500 MG tablet Commonly known as: ROBAXIN Take 1 tablet (500 mg total) by mouth every 8 (eight) hours as needed for muscle spasms.   metoprolol tartrate 25 MG tablet Commonly known as: LOPRESSOR Take 25 mg by mouth 2 (two) times daily.   oxyCODONE 5 MG immediate release tablet Commonly known as: Oxy IR/ROXICODONE Take 1 tablet (5 mg total) by mouth every 6 (six) hours as needed for severe pain or breakthrough pain.   spironolactone 25 MG tablet Commonly known as: ALDACTONE Take 25 mg by mouth daily.   Trelegy Ellipta 100-62.5-25 MCG/INH Aepb Generic drug: Fluticasone-Umeclidin-Vilant Inhale 1 puff into the lungs daily.       Follow-up Information    Schnier, Dolores Lory, MD Follow up.   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   Specialties: Cardiothoracic Surgery, General Surgery Why: followup hospitalization Contact information: [address] [place] [address] [phone]                Total time spent arranging discharge was <18min. Signed: Benjamine Sprague 11/24/2019, 2:01 PM

## 2019-11-24 NOTE — Progress Notes (Signed)
  She returns today in follow-up.  Her biggest complaint is constipation as well as numbness in the anterior aspect of her breast and chest wall.  She does have some discomfort along her decision but this is not her major complaint.  She is been exercising some.  She states that she has had slow return of her bowel function.  She is only taking Tylenol today for pain.  She would like to request something stronger.  On physical exam her wounds are healing as expected.  The Steri-Strips are still in place.  Her wounds are clean dry and intact.  Her lungs are equal bilaterally.  Her heart is regular.  She did have a chest x-ray which have independently reviewed.  This looks quite good overall.  There is no pneumothorax or pleural effusion  I will prescribe her some Percocet to take for pain.  Will discontinue the ibuprofen and the Robaxin. I suggested that she try the Senokot on a daily basis and also try some magnesium citrate if she has continued problems with  pain constipation.  She will follow-up with Dr. Celine Ahr for her thyroid lesion She will follow-up with Dr. Rogue Bussing for her lung cancer She will follow-up with me in 2 months

## 2019-12-01 ENCOUNTER — Encounter: Payer: Medicare Other | Admitting: Cardiothoracic Surgery

## 2019-12-11 ENCOUNTER — Ambulatory Visit (INDEPENDENT_AMBULATORY_CARE_PROVIDER_SITE_OTHER): Payer: Medicare Other

## 2019-12-11 DIAGNOSIS — R55 Syncope and collapse: Secondary | ICD-10-CM

## 2019-12-12 LAB — CUP PACEART REMOTE DEVICE CHECK
Date Time Interrogation Session: 20211210082008
Implantable Pulse Generator Implant Date: 20210928

## 2019-12-15 ENCOUNTER — Telehealth: Payer: Self-pay | Admitting: Cardiothoracic Surgery

## 2019-12-15 NOTE — Telephone Encounter (Signed)
Patient is calling and complaining of having some pain and is asking if some pain medication could be called into pharmacy , patient said she has been taken tylenol but feel like it's not helping. Please call patient and advise.

## 2019-12-16 ENCOUNTER — Other Ambulatory Visit: Payer: Self-pay | Admitting: Physician Assistant

## 2019-12-16 MED ORDER — OXYCODONE HCL 5 MG PO TABS: 5.0000 mg | ORAL_TABLET | Freq: Four times a day (QID) | ORAL | 0 refills | Status: DC | PRN

## 2019-12-16 NOTE — Telephone Encounter (Signed)
Spoke with patient to notify her that Ann [name], [place] sent over refill prescription Oxycodone for patient this morning on behalf of Dr Genevive Bi being out of the office. Patient was advised to give our office a call back if she has any question or concerns to arise. Patient verbalized understanding and has no further questions at this time.

## 2019-12-23 NOTE — Progress Notes (Signed)
Carelink Summary Report / Loop Recorder 

## 2020-01-12 ENCOUNTER — Ambulatory Visit (INDEPENDENT_AMBULATORY_CARE_PROVIDER_SITE_OTHER): Payer: Medicare Other

## 2020-01-12 DIAGNOSIS — I639 Cerebral infarction, unspecified: Secondary | ICD-10-CM

## 2020-01-14 LAB — CUP PACEART REMOTE DEVICE CHECK
Date Time Interrogation Session: 20220112081713
Implantable Pulse Generator Implant Date: 20210928

## 2020-01-25 NOTE — Progress Notes (Signed)
[facility] Summary Report / Loop Recorder

## 2020-01-27 ENCOUNTER — Emergency Department: Payer: Medicare Other

## 2020-01-27 ENCOUNTER — Emergency Department
Admission: EM | Admit: 2020-01-27 | Discharge: 2020-01-27 | Disposition: A | Discharge status: Home / Self Care | Payer: Medicare Other | Attending: Emergency Medicine | Admitting: Emergency Medicine

## 2020-01-27 ENCOUNTER — Other Ambulatory Visit: Payer: Self-pay

## 2020-01-27 DIAGNOSIS — R1012 Left upper quadrant pain: Secondary | ICD-10-CM | POA: Insufficient documentation

## 2020-01-27 DIAGNOSIS — Z87891 Personal history of nicotine dependence: Secondary | ICD-10-CM | POA: Insufficient documentation

## 2020-01-27 DIAGNOSIS — M542 Cervicalgia: Secondary | ICD-10-CM | POA: Insufficient documentation

## 2020-01-27 DIAGNOSIS — I10 Essential (primary) hypertension: Secondary | ICD-10-CM | POA: Insufficient documentation

## 2020-01-27 DIAGNOSIS — R600 Localized edema: Secondary | ICD-10-CM | POA: Diagnosis not present

## 2020-01-27 DIAGNOSIS — R079 Chest pain, unspecified: Secondary | ICD-10-CM

## 2020-01-27 DIAGNOSIS — M79622 Pain in left upper arm: Secondary | ICD-10-CM | POA: Diagnosis not present

## 2020-01-27 DIAGNOSIS — J449 Chronic obstructive pulmonary disease, unspecified: Secondary | ICD-10-CM | POA: Diagnosis not present

## 2020-01-27 DIAGNOSIS — M25512 Pain in left shoulder: Secondary | ICD-10-CM | POA: Diagnosis present

## 2020-01-27 DIAGNOSIS — Z7982 Long term (current) use of aspirin: Secondary | ICD-10-CM | POA: Insufficient documentation

## 2020-01-27 DIAGNOSIS — Z79899 Other long term (current) drug therapy: Secondary | ICD-10-CM | POA: Diagnosis not present

## 2020-01-27 DIAGNOSIS — J9811 Atelectasis: Secondary | ICD-10-CM | POA: Insufficient documentation

## 2020-01-27 DIAGNOSIS — I2510 Atherosclerotic heart disease of native coronary artery without angina pectoris: Secondary | ICD-10-CM | POA: Diagnosis not present

## 2020-01-27 LAB — BASIC METABOLIC PANEL
Anion gap: 12 (ref 5–15)
BUN: 14 mg/dL (ref 8–23)
CO2: 28 mmol/L (ref 22–32)
Calcium: 9.2 mg/dL (ref 8.9–10.3)
Chloride: 100 mmol/L (ref 98–111)
Creatinine, Ser: 0.58 mg/dL (ref 0.44–1.00)
GFR, Estimated: >60 mL/min (ref >60)
Glucose, Bld: 108 mg/dL — ABNORMAL HIGH (ref 70–99)
Potassium: 3.3 mmol/L — ABNORMAL LOW (ref 3.5–5.1)
Sodium: 140 mmol/L (ref 135–145)

## 2020-01-27 LAB — CBC
HCT: 40.9 % (ref 36.0–46.0)
Hemoglobin: 13.2 g/dL (ref 12.0–15.0)
MCH: 30.5 pg (ref 26.0–34.0)
MCHC: 32.3 g/dL (ref 30.0–36.0)
MCV: 94.5 fL (ref 80.0–100.0)
Platelets: 277 10*3/uL (ref 150–400)
RBC: 4.33 MIL/uL (ref 3.87–5.11)
RDW: 14.3 % (ref 11.5–15.5)
WBC: 6.7 10*3/uL (ref 4.0–10.5)
nRBC: 0 % (ref 0.0–0.2)

## 2020-01-27 LAB — BRAIN NATRIURETIC PEPTIDE: B Natriuretic Peptide: 88.9 pg/mL (ref 0.0–100.0)

## 2020-01-27 LAB — HEPATIC FUNCTION PANEL
ALT: 16 U/L (ref 0–44)
AST: 16 U/L (ref 15–41)
Albumin: 3.6 g/dL (ref 3.5–5.0)
Alkaline Phosphatase: 85 U/L (ref 38–126)
Bilirubin, Direct: <0.1 mg/dL (ref 0.0–0.2)
Total Bilirubin: 0.4 mg/dL (ref 0.3–1.2)
Total Protein: 6.7 g/dL (ref 6.5–8.1)

## 2020-01-27 LAB — TROPONIN I (HIGH SENSITIVITY)
Troponin I (High Sensitivity): 13 ng/L (ref <18)
Troponin I (High Sensitivity): 16 ng/L (ref <18)

## 2020-01-27 LAB — LIPASE, BLOOD: Lipase: 26 U/L (ref 11–51)

## 2020-01-27 MED ORDER — HYDROCODONE-ACETAMINOPHEN 5-325 MG PO TABS: 1.0000 | ORAL_TABLET | Freq: Four times a day (QID) | ORAL | 0 refills | Status: DC | PRN

## 2020-01-27 MED ORDER — ONDANSETRON HCL 4 MG/2ML IJ SOLN
4.0000 mg | INTRAMUSCULAR | Status: AC
  Administered 2020-01-27: 4 mg via INTRAVENOUS
  Filled 2020-01-27: qty 2

## 2020-01-27 MED ORDER — MORPHINE SULFATE (PF) 2 MG/ML IV SOLN
2.0000 mg | Freq: Once | INTRAVENOUS | Status: AC
  Administered 2020-01-27: 2 mg via INTRAVENOUS
  Filled 2020-01-27: qty 1

## 2020-01-27 MED ORDER — AZITHROMYCIN 250 MG PO TABS: ORAL_TABLET | ORAL | 0 refills | Status: DC

## 2020-01-27 MED ORDER — HYDROCODONE-ACETAMINOPHEN 5-325 MG PO TABS
1.0000 | ORAL_TABLET | Freq: Once | ORAL | Status: AC
  Administered 2020-01-27: 1 via ORAL
  Filled 2020-01-27: qty 1

## 2020-01-27 MED ORDER — AZITHROMYCIN 500 MG PO TABS
500.0000 mg | ORAL_TABLET | Freq: Once | ORAL | Status: AC
  Administered 2020-01-27: 500 mg via ORAL
  Filled 2020-01-27: qty 1

## 2020-01-27 MED ORDER — METOPROLOL TARTRATE 25 MG PO TABS
25.0000 mg | ORAL_TABLET | Freq: Once | ORAL | Status: AC
  Administered 2020-01-27: 25 mg via ORAL
  Filled 2020-01-27: qty 1

## 2020-01-27 MED ORDER — IOHEXOL 350 MG/ML SOLN
75.0000 mL | Freq: Once | INTRAVENOUS | Status: AC | PRN
  Administered 2020-01-27: 75 mL via INTRAVENOUS

## 2020-01-27 NOTE — ED Triage Notes (Signed)
Pt here with c/o left sided neck and shoulder swelling. Pt states she has lung cancer and underwent surgery back in November and they removed part of her lung.  Pt states no chemo or radiation.  Pt states this pain started yesterday when she woke up.

## 2020-01-27 NOTE — ED Notes (Signed)
Patient given warm blankets.

## 2020-01-27 NOTE — ED Provider Notes (Signed)
[facility] Emergency Department Provider Note   ____________________________________________   Event Date/Time   First MD Initiated Contact with Patient 01/27/20 1755     (approximate)  I have reviewed the triage vital signs and the nursing notes.   HISTORY  Chief Complaint Left sided shoulder chest abdominal pain and swelling     HPI Ann [name] is a 65 y.o. female with recent surgery for lung cancer with Dr. Faith Rogue.  She also has a history of COPD and coronary disease  She reports that about 3 weeks ago she noticed left-sided shoulder pain, she went and saw orthopedic doctor and they did an injection noting it was possibly bursitis.  Then over the last couple weeks she is also noticed her belly is feeling a bit swollen or distended, she is having pain over her left abdomen left chest and left upper arm and she is also noticed her left upper arm seems slightly swollen from the shoulder down to about the mid left arm.  There is also painful achy.  Symptoms seem to be getting worse over the last 3 weeks, and last night also started experiencing achy pain in her left side of her neck.  She has not yet been able to follow-up with Dr. Genevive Bi as he is out of medical illness   Past Medical History:  Diagnosis Date  . COPD (chronic obstructive pulmonary disease) ([place])   . Coronary artery disease    Prior stenting  . Hypertension Hypoxic  . Hypokalemia   . Multiple thyroid nodules   . Myocardial infarction ([place])   . Stroke [facility]) 2005   TIA  . Syncope     Patient Active Problem List   Diagnosis Date Noted  . Goals of care, counseling/discussion 11/23/2019  . Malignant neoplasm of upper lobe of right lung ([place]) 11/23/2019  . Ischemia of extremity 11/14/2019  . Dyspnea 11/13/2019  . Pleural effusion 11/13/2019  . Hypokalemia 11/05/2019  . HLD (hyperlipidemia) 11/05/2019  . COPD (chronic obstructive pulmonary disease) ([place]) 11/05/2019  . Stroke ([place])  11/05/2019  . Leukocytosis 11/05/2019  . Lung mass 11/04/2019  . Vasovagal syncope 09/23/2018  . H/O right coronary artery stent placement 09/23/2018  . Elevated hemoglobin A1c 09/04/2018  . Tobacco use 07/14/2016  . Status post lumbar surgery 07/14/2016  . HTN, goal below 140/90 07/14/2016  . Healthcare maintenance 07/14/2016  . Coronary artery disease involving native coronary artery of native heart without angina pectoris 07/14/2016    Past Surgical History:  Procedure Laterality Date  . BACK SURGERY     DECOMRESSION L4-5 WITH RODS AND FUSION  . CAROTID STENT  07/18/2003  . NECK SURGERY    . THORACOTOMY/LOBECTOMY Right 11/04/2019   Procedure: THORACOTOMY/LOBECTOMY;  Surgeon: Nestor Lewandowsky, MD;  Location: [facility] ORS;  Service: Thoracic;  Laterality: Right;  . TONSILLECTOMY    . TUBAL LIGATION      Prior to Admission medications   Medication Sig Start Date End Date Taking? Authorizing Provider  azithromycin (ZITHROMAX) 250 MG tablet 1 tab by mouth daily 01/27/20  Yes Delman Kitten, MD  HYDROcodone-acetaminophen (NORCO/VICODIN) 5-325 MG tablet Take 1 tablet by mouth every 6 (six) hours as needed for moderate pain. 01/27/20  Yes Delman Kitten, MD  aspirin 325 MG tablet Take 325 mg by mouth daily.     [provider]  fluticasone (FLONASE) 50 MCG/ACT nasal spray Place 2 sprays into both nostrils daily as needed for allergies.  09/10/19   [provider]  furosemide (LASIX) 20 MG tablet Take 20 mg by mouth daily as needed for edema.  09/24/19 09/23/20  [provider]  ibuprofen (ADVIL) 600 MG tablet Take 1 tablet (600 mg total) by mouth every 6 (six) hours as needed. 11/11/19   Tylene Fantasia, PA-C  lisinopril-hydrochlorothiazide (ZESTORETIC) 20-12.5 MG tablet Take 1 tablet by mouth daily. 11/17/19   [provider]  methocarbamol (ROBAXIN) 500 MG tablet Take 1 tablet (500 mg total) by mouth every 8 (eight) hours as needed for muscle spasms. 11/11/19   Tylene Fantasia, PA-C  metoprolol tartrate (LOPRESSOR) 25 MG tablet Take 25 mg by mouth 2 (two) times daily. 07/12/19   [provider]  spironolactone (ALDACTONE) 25 MG tablet Take 25 mg by mouth daily.  09/10/19   [provider]  TRELEGY ELLIPTA 100-62.5-25 MCG/INH AEPB Inhale 1 puff into the lungs daily. 10/15/19   [provider]    Allergies Clopidogrel  Family History  Problem Relation Age of Onset  . Alzheimer's disease Mother   . Heart attack Father   . Diabetes Mellitus II Brother   . Diabetes Mellitus II Brother     Social History Social History   Tobacco Use  . Smoking status: Former Smoker    Packs/day: 0.25    Years: 49.00    Pack years: 12.25    Types: Cigarettes    Quit date: 10/17/2019    Years since quitting: 0.2  . Smokeless tobacco: Never Used  . Tobacco comment: 1 pack lasts 3-4 days  Vaping Use  . Vaping Use: Never used  Substance Use Topics  . Alcohol use: Yes    Comment: 1 shot of vodka with lemonade every evening  . Drug use: Never    Review of Systems Constitutional: No fever/chills Eyes: No visual changes. ENT: No sore throat.  Some pain over the left side of her neck for the last day Cardiovascular: Pain over left chest primarily over the left shoulder region worse with movement but also some in her left upper chest and it runs all the way down into her left mid abdomen Respiratory: Denies shortness of breath. Gastrointestinal: Abdomen seems swollen worsening over the last few weeks, also reports when she walks the left side of her abdomen hurts and she has decreased appetite but seems to be gaining weight Genitourinary: Negative for dysuria. Musculoskeletal: Negative for back pain. Skin: Negative for rash except she feels like there is some slight swelling primarily over her left shoulder left upper arm and also into her left upper chest. Neurological: Negative for headaches, areas of focal weakness or  numbness.    ____________________________________________   PHYSICAL EXAM:  VITAL SIGNS: ED Triage Vitals  Enc Vitals Group     BP 01/27/20 1408 (!) 164/87     Pulse Rate 01/27/20 1408 80     Resp 01/27/20 1408 17     Temp 01/27/20 1408 98.4 F (36.9 C)     Temp Source 01/27/20 1408 Oral     SpO2 01/27/20 1408 99 %     Weight --      Height --      Head Circumference --      Peak Flow --      Pain Score 01/27/20 1355 8     Pain Loc --      Pain Edu? --      Excl. in [place]? --     Constitutional: Alert and oriented.  Chronically ill-appearing slight discomfort reporting  shoulder region pain and left-sided abdominal pain but in no acute distress Eyes: Conjunctivae are normal. Head: Atraumatic. Nose: No congestion/rhinnorhea. Mouth/Throat: Mucous membranes are moist. Neck: No stridor.  Cardiovascular: Normal rate, regular rhythm. Grossly normal heart sounds.  Good peripheral circulation.  She has some tenderness and apparent slight edema over her left shoulder region without erythema or warmth that seem to extend just down to about the mid left humerus when she reports the area to be slightly tender. Respiratory: Normal respiratory effort.  No retractions. Lungs CTAB. Gastrointestinal: Soft and mildly distended and palpation on the left side reproduces tenderness along her left pelvic and left upper quadrant without rebound or guarding.  No right-sided abdominal pain.  No hernias nor masses noted Musculoskeletal: No lower extremity tenderness nor edema. Neurologic:  Normal speech and language. No gross focal neurologic deficits are appreciated.  Skin:  Skin is warm, dry and intact. No rash noted. Psychiatric: Mood and affect are normal. Speech and behavior are normal.  ____________________________________________   LABS (all labs ordered are listed, but only abnormal results are displayed)  Labs Reviewed  BASIC METABOLIC PANEL - Abnormal; Notable for the following  components:      Result Value   Potassium 3.3 (*)    Glucose, Bld 108 (*)    All other components within normal limits  CBC  BRAIN NATRIURETIC PEPTIDE  LIPASE, BLOOD  HEPATIC FUNCTION PANEL  TROPONIN I (HIGH SENSITIVITY)  TROPONIN I (HIGH SENSITIVITY)   ____________________________________________  EKG  Reviewed inter by me at 1405 Heart rate 89 QRS 110 QTc 460 Normal sinus rhythm, first-degree AV block.  No evidence of acute ischemia. ____________________________________________  RADIOLOGY  DG Chest 1 View  Result Date: 01/27/2020 CLINICAL DATA:  Neck swelling EXAM: CHEST  1 VIEW COMPARISON:  11/24/2019 FINDINGS: Heart is normal size. Clearing of the previously seen right basilar opacity. Minimal left basilar opacity, atelectasis or early infiltrate. No effusions or acute bony abnormality. IMPRESSION: New minimal left basilar atelectasis or early infiltrate. Electronically Signed   By: Rolm Baptise M.D.   On: 01/27/2020 15:01   CT Angio Chest PE W and/or Wo Contrast  Result Date: 01/27/2020 CLINICAL DATA:  Acute abdominal pain. Left-sided neck and shoulder swelling. History of lung cancer postoperative from November. EXAM: CT ANGIOGRAPHY CHEST CT ABDOMEN AND PELVIS WITH CONTRAST TECHNIQUE: Multidetector CT imaging of the chest was performed using the standard protocol during bolus administration of intravenous contrast. Multiplanar CT image reconstructions and MIPs were obtained to evaluate the vascular anatomy. Multidetector CT imaging of the abdomen and pelvis was performed using the standard protocol during bolus administration of intravenous contrast. CONTRAST:  60mL OMNIPAQUE IOHEXOL 350 MG/ML SOLN COMPARISON:  CT chest 11/13/2019 FINDINGS: CTA CHEST FINDINGS Cardiovascular: Good opacification of the central and segmental pulmonary arteries. No focal filling defects. No evidence of significant pulmonary embolus. Normal caliber thoracic aorta. No aortic dissection. Great vessel  origins are patent although there is evidence of significant stenosis at the origin of the left common carotid artery. Calcification in the aorta and coronary arteries. Mild cardiac enlargement. No pericardial effusions. Mediastinum/Nodes: Esophagus is decompressed. No significant lymphadenopathy in the chest. Lungs/Pleura: Emphysematous changes in the lungs. Surgical clips in the right middle lung. No airspace disease or consolidation in the lungs. Mild infiltration or atelectasis in the left costophrenic angle. No focal consolidation. No pulmonary nodules. Airways are patent. No pleural effusion or pneumothorax. Musculoskeletal: Healing fracture in a right lateral rib consistent with thoracotomy. Degenerative changes in the  spine. Postoperative changes in the cervical spine. No destructive bone lesions. Review of the MIP images confirms the above findings. CT ABDOMEN and PELVIS FINDINGS Hepatobiliary: No focal liver abnormality is seen. No gallstones, gallbladder wall thickening, or biliary dilatation. Pancreas: Unremarkable. No pancreatic ductal dilatation or surrounding inflammatory changes. Spleen: Normal in size without focal abnormality. Adrenals/Urinary Tract: Adrenal glands are unremarkable. Kidneys are normal, without renal calculi, focal lesion, or hydronephrosis. Bladder is unremarkable. Stomach/Bowel: Stomach is within normal limits. Appendix appears normal. No evidence of bowel wall thickening, distention, or inflammatory changes. Vascular/Lymphatic: Aortic atherosclerosis. No enlarged abdominal or pelvic lymph nodes. Reproductive: Uterus and bilateral adnexa are unremarkable. Other: No abdominal wall hernia or abnormality. No abdominopelvic ascites. Musculoskeletal: Posterior fixation at L4-5. No destructive bone lesions. Review of the MIP images confirms the above findings. IMPRESSION: 1. No evidence of significant pulmonary embolus. 2. Emphysematous changes in the lungs. 3. Mild infiltration or  atelectasis in the left costophrenic angle. 4. Postoperative right thoracotomy. 5. No acute process demonstrated in the abdomen or pelvis. 6. Emphysema and aortic atherosclerosis. Left common carotid artery origin stenosis. Aortic Atherosclerosis (ICD10-I70.0) and Emphysema (ICD10-J43.9). Electronically Signed   By: Lucienne Capers M.D.   On: 01/27/2020 20:20   CT Cervical Spine Wo Contrast  Result Date: 01/27/2020 CLINICAL DATA:  Left-sided neck and shoulder swelling, history of lung cancer EXAM: CT CERVICAL SPINE WITHOUT CONTRAST TECHNIQUE: Multidetector CT imaging of the cervical spine was performed without intravenous contrast. Multiplanar CT image reconstructions were also generated. COMPARISON:  11/14/2019, 10/21/2019 FINDINGS: Alignment: Alignment is anatomic. Skull base and vertebrae: No acute fracture. No primary bone lesion or focal pathologic process. Soft tissues and spinal canal: Extensive atherosclerosis of the carotid vessels. 3.1 x 2.2 by 2.6 cm right lobe thyroid nodule. No prevertebral fluid or swelling. No visible canal hematoma. Disc levels: Previous ACDF spanning C4 through C7, with bony fusion across the disc spaces. Spondylosis at C2-3 and to a lesser extent C3-4. Symmetrical neural foraminal encroachment at C2-3, with right neural foraminal encroachment at C3-4. Upper chest: Airway is patent. Lung apices are clear. Other: Reconstructed images demonstrate no additional findings. IMPRESSION: 1. No acute cervical spine fracture. 2. Previous ACDF spanning C4 through C7, with bony fusion across the disc spaces. 3. Spondylosis at C2-3 and to a lesser extent C3-4. Symmetrical neural foraminal encroachment at C2-3, with right neural foraminal encroachment at C3-4. 4. A 3.1 cm right lobe thyroid nodule. This has been evaluated on previous imaging. (ref: J Am Coll Radiol. 2015 Feb;12(2): 143-50). Electronically Signed   By: Randa Ngo M.D.   On: 01/27/2020 20:15   CT ABDOMEN PELVIS W  CONTRAST  Result Date: 01/27/2020 CLINICAL DATA:  Acute abdominal pain. Left-sided neck and shoulder swelling. History of lung cancer postoperative from November. EXAM: CT ANGIOGRAPHY CHEST CT ABDOMEN AND PELVIS WITH CONTRAST TECHNIQUE: Multidetector CT imaging of the chest was performed using the standard protocol during bolus administration of intravenous contrast. Multiplanar CT image reconstructions and MIPs were obtained to evaluate the vascular anatomy. Multidetector CT imaging of the abdomen and pelvis was performed using the standard protocol during bolus administration of intravenous contrast. CONTRAST:  67mL OMNIPAQUE IOHEXOL 350 MG/ML SOLN COMPARISON:  CT chest 11/13/2019 FINDINGS: CTA CHEST FINDINGS Cardiovascular: Good opacification of the central and segmental pulmonary arteries. No focal filling defects. No evidence of significant pulmonary embolus. Normal caliber thoracic aorta. No aortic dissection. Great vessel origins are patent although there is evidence of significant stenosis at the origin of the  left common carotid artery. Calcification in the aorta and coronary arteries. Mild cardiac enlargement. No pericardial effusions. Mediastinum/Nodes: Esophagus is decompressed. No significant lymphadenopathy in the chest. Lungs/Pleura: Emphysematous changes in the lungs. Surgical clips in the right middle lung. No airspace disease or consolidation in the lungs. Mild infiltration or atelectasis in the left costophrenic angle. No focal consolidation. No pulmonary nodules. Airways are patent. No pleural effusion or pneumothorax. Musculoskeletal: Healing fracture in a right lateral rib consistent with thoracotomy. Degenerative changes in the spine. Postoperative changes in the cervical spine. No destructive bone lesions. Review of the MIP images confirms the above findings. CT ABDOMEN and PELVIS FINDINGS Hepatobiliary: No focal liver abnormality is seen. No gallstones, gallbladder wall thickening, or  biliary dilatation. Pancreas: Unremarkable. No pancreatic ductal dilatation or surrounding inflammatory changes. Spleen: Normal in size without focal abnormality. Adrenals/Urinary Tract: Adrenal glands are unremarkable. Kidneys are normal, without renal calculi, focal lesion, or hydronephrosis. Bladder is unremarkable. Stomach/Bowel: Stomach is within normal limits. Appendix appears normal. No evidence of bowel wall thickening, distention, or inflammatory changes. Vascular/Lymphatic: Aortic atherosclerosis. No enlarged abdominal or pelvic lymph nodes. Reproductive: Uterus and bilateral adnexa are unremarkable. Other: No abdominal wall hernia or abnormality. No abdominopelvic ascites. Musculoskeletal: Posterior fixation at L4-5. No destructive bone lesions. Review of the MIP images confirms the above findings. IMPRESSION: 1. No evidence of significant pulmonary embolus. 2. Emphysematous changes in the lungs. 3. Mild infiltration or atelectasis in the left costophrenic angle. 4. Postoperative right thoracotomy. 5. No acute process demonstrated in the abdomen or pelvis. 6. Emphysema and aortic atherosclerosis. Left common carotid artery origin stenosis. Aortic Atherosclerosis (ICD10-I70.0) and Emphysema (ICD10-J43.9). Electronically Signed   By: Lucienne Capers M.D.   On: 01/27/2020 20:20   US Venous Img Upper Uni Left  Result Date: 01/27/2020 CLINICAL DATA:  Pain and swelling EXAM: LEFT UPPER EXTREMITY VENOUS DOPPLER ULTRASOUND TECHNIQUE: Gray-scale sonography with graded compression, as well as color Doppler and duplex ultrasound were performed to evaluate the upper extremity deep venous system from the level of the subclavian vein and including the jugular, axillary, basilic, radial, ulnar and upper cephalic vein. Spectral Doppler was utilized to evaluate flow at rest and with distal augmentation maneuvers. COMPARISON:  None. FINDINGS: There is no DVT identified involving the left upper extremity including the  left subclavian vein, left axillary vein, brachial vein, radial vein, or ulnar vein. The cephalic vein was unremarkable. The jugular vein was unremarkable. There is some decreased phasicity throughout the left upper extremity which is of unknown clinical significance. IMPRESSION: 1. No definite DVT identified. 2. Diminished phasicity in the left upper extremity of unknown clinical significance. This may be secondary to an obstruction or stenosis more centrally. Attention on the patient's subsequent CT of the chest is recommended. Electronically Signed   By: Constance Holster M.D.   On: 01/27/2020 19:48    Imaging reviewed, generally no acute findings on chest abdomen pelvis.  I do not see evidence of acute findings in cervical spine, please see radiology report so has multiple incidental.  Additionally left upper extremity ultrasound does show a potential stenosis or other obstructive lesion due to diminished phasicity, though no overt blockage or DVT is identified.  Discussed this with the patient and she will follow up with vascular surgery was well ____________________________________________   PROCEDURES  Procedure(s) performed: None  Procedures  Critical Care performed: No  ____________________________________________   INITIAL IMPRESSION / ASSESSMENT AND PLAN / ED COURSE  Pertinent labs & imaging results that were  available during my care of the patient were reviewed by me and considered in my medical decision making (see chart for details).   Patient presents for complaints of worsening pain including left chest pain left neck pain left-sided abdominal pain over the last 3 weeks.  Reassuring exam though she does have some slight soft but may be mild distention of the abdomen.  She demonstrates good use of the shoulder but has some very slight swelling over the left shoulder left upper extremity.  Strong radial pulses with normal capillary perfusion and dorsalis pedis pulses bilateral.   I do not see evidence of acute arterial lesion.  Will obtain imaging studies given the patient's history of lung cancer and recent surgery including chest abdomen pelvis cervical spine and DVT evaluation of the left upper extremity  Low suspicion for ACS and she does not have any acute infectious symptoms.  Nontoxic somewhat hypertensive but reports missing her home blood pressure medicine and reports her blood pressure tends to go up if she is having pain  Clinical Course as of 01/27/20 2156  Tue Jan 27, 2020  2110 Message sent to surgery Dr. Dahlia Byes as well as copy to Dr. Genevive Bi requesting a follow-up appointment for the patient [MQ]  2110 Patient does feel improved.  She reports that she is previously used oxycodone for pain relief but has no longer had this for at least a month.  She would like to try something for prescription pain relief at home, think this is reasonable.  Also discussed somewhat unclear if she may have a slight or early developing pneumonia though I favor unlikely but given her recent thoracotomy procedure and left-sided chest pain we discussed risk benefits and patient would be like to have [MQ]  2111 Effect with Z-Pak per the patient.   antibiotic prescription, previously patient comfortable with plan for discharge and close follow-up with her primary and also recommendation follow-up with vascular surgery. [MQ]    Clinical Course User Index [MQ] Delman Kitten, MD    ----------------------------------------- 9:13 PM on 01/27/2020 -----------------------------------------  I will prescribe the patient a narcotic pain medicine due to their condition which I anticipate will cause at least moderate pain short term. I discussed with the patient safe use of narcotic pain medicines, and that they are not to drive, work in dangerous areas, or ever take more than prescribed (no more than 1 pill every 6 hours). We discussed that this is the type of medication that can be  overdosed on and  the risks of this type of medicine. Patient is very agreeable to only use as prescribed and to never use more than prescribed.  Return precautions and treatment recommendations and follow-up discussed with the patient who is agreeable with the plan.  Trop 2 normal , patient does report her pain is improved.  Resting comfortably husband coming to pick her up ____________________________________________   FINAL CLINICAL IMPRESSION(S) / ED DIAGNOSES  Final diagnoses:  Left-sided chest pain  Atelectasis of left lung        Note:  This document was prepared using Dragon voice recognition software and may include unintentional dictation errors       Delman Kitten, MD 01/27/20 2156

## 2020-01-28 ENCOUNTER — Encounter: Payer: Self-pay | Admitting: Emergency Medicine

## 2020-01-28 ENCOUNTER — Emergency Department
Admission: EM | Admit: 2020-01-28 | Discharge: 2020-01-28 | Disposition: A | Discharge status: Home / Self Care | Payer: Medicare Other | Attending: Emergency Medicine | Admitting: Emergency Medicine

## 2020-01-28 ENCOUNTER — Telehealth: Payer: Self-pay

## 2020-01-28 ENCOUNTER — Other Ambulatory Visit: Payer: Self-pay

## 2020-01-28 DIAGNOSIS — M25512 Pain in left shoulder: Secondary | ICD-10-CM | POA: Diagnosis not present

## 2020-01-28 DIAGNOSIS — Z79899 Other long term (current) drug therapy: Secondary | ICD-10-CM | POA: Insufficient documentation

## 2020-01-28 DIAGNOSIS — Z7982 Long term (current) use of aspirin: Secondary | ICD-10-CM | POA: Insufficient documentation

## 2020-01-28 DIAGNOSIS — J449 Chronic obstructive pulmonary disease, unspecified: Secondary | ICD-10-CM | POA: Insufficient documentation

## 2020-01-28 DIAGNOSIS — Z85850 Personal history of malignant neoplasm of thyroid: Secondary | ICD-10-CM | POA: Insufficient documentation

## 2020-01-28 DIAGNOSIS — Z87891 Personal history of nicotine dependence: Secondary | ICD-10-CM | POA: Insufficient documentation

## 2020-01-28 DIAGNOSIS — I10 Essential (primary) hypertension: Secondary | ICD-10-CM | POA: Diagnosis not present

## 2020-01-28 DIAGNOSIS — M25412 Effusion, left shoulder: Secondary | ICD-10-CM

## 2020-01-28 DIAGNOSIS — R221 Localized swelling, mass and lump, neck: Secondary | ICD-10-CM | POA: Insufficient documentation

## 2020-01-28 DIAGNOSIS — I2510 Atherosclerotic heart disease of native coronary artery without angina pectoris: Secondary | ICD-10-CM | POA: Diagnosis not present

## 2020-01-28 HISTORY — DX: Malignant neoplasm of thyroid gland: C73

## 2020-01-28 NOTE — Telephone Encounter (Signed)
Spoke with Dr.Oconnell at this time- he wanted DR.Cannon to know the Affirma results are suspicious-patient is scheduled to see Dr.Cannon 02/03/2020-Affirma results will be faxed over today from [facility].

## 2020-01-28 NOTE — ED Triage Notes (Signed)
Pt reports swelling to the entire left side of her body from her neck down. Pt states that she was seen here yesterday for the same and advised to follow up with  A surgeon. Pt reports has not made the appt yet but has one with her MD on 2/9

## 2020-01-28 NOTE — ED Provider Notes (Signed)
[facility] Emergency Department Provider Note   ____________________________________________   Event Date/Time   First MD Initiated Contact with Patient 01/28/20 1804     (approximate)  I have reviewed the triage vital signs and the nursing notes.   HISTORY  Chief Complaint Arm Swelling and bodyaches    HPI Ann [name] is a 65 y.o. female with a stated past medical history of recent surgery for lung cancer with Dr. Genevive Bi, COPD, CAD, hypertension, and thyroid nodules who presents for the second day in a row for left-sided swelling.  Patient states that she came back today as the left side of her neck appears more swollen than usual and she is "afraid to eat, drink, or go to sleep".  Patient states that her home narcotic prescription helps with her pain but the swelling has gotten worse.  Patient made an appointment with her surgeon for 2/9.  Patient denies any shortness of breath, or difficulty swallowing or handling her secretions.  Patient currently denies any vision changes, tinnitus, difficulty speaking, facial droop, sore throat, chest pain, shortness of breath, abdominal pain, nausea/vomiting/diarrhea, dysuria, or weakness/numbness/paresthesias in any extremity         Past Medical History:  Diagnosis Date  . COPD (chronic obstructive pulmonary disease) ([place])   . Coronary artery disease    Prior stenting  . Hypertension Hypoxic  . Hypokalemia   . Multiple thyroid nodules   . Myocardial infarction ([place])   . Stroke [facility]) 2005   TIA  . Syncope   . Thyroid cancer [facility])     Patient Active Problem List   Diagnosis Date Noted  . Goals of care, counseling/discussion 11/23/2019  . Malignant neoplasm of upper lobe of right lung ([place]) 11/23/2019  . Ischemia of extremity 11/14/2019  . Dyspnea 11/13/2019  . Pleural effusion 11/13/2019  . Hypokalemia 11/05/2019  . HLD (hyperlipidemia) 11/05/2019  . COPD (chronic obstructive pulmonary disease)  ([place]) 11/05/2019  . Stroke ([place]) 11/05/2019  . Leukocytosis 11/05/2019  . Lung mass 11/04/2019  . Vasovagal syncope 09/23/2018  . H/O right coronary artery stent placement 09/23/2018  . Elevated hemoglobin A1c 09/04/2018  . Tobacco use 07/14/2016  . Status post lumbar surgery 07/14/2016  . HTN, goal below 140/90 07/14/2016  . Healthcare maintenance 07/14/2016  . Coronary artery disease involving native coronary artery of native heart without angina pectoris 07/14/2016    Past Surgical History:  Procedure Laterality Date  . BACK SURGERY     DECOMRESSION L4-5 WITH RODS AND FUSION  . CAROTID STENT  07/18/2003  . NECK SURGERY    . THORACOTOMY/LOBECTOMY Right 11/04/2019   Procedure: THORACOTOMY/LOBECTOMY;  Surgeon: Nestor Lewandowsky, MD;  Location: [facility] ORS;  Service: Thoracic;  Laterality: Right;  . TONSILLECTOMY    . TUBAL LIGATION      Prior to Admission medications   Medication Sig Start Date End Date Taking? Authorizing Provider  aspirin 325 MG tablet Take 325 mg by mouth daily.     [provider]  azithromycin (ZITHROMAX) 250 MG tablet 1 tab by mouth daily 01/27/20   Delman Kitten, MD  fluticasone [facility]) 50 MCG/ACT nasal spray Place 2 sprays into both nostrils daily as needed for allergies.  09/10/19   [provider]  furosemide (LASIX) 20 MG tablet Take 20 mg by mouth daily as needed for edema.  09/24/19 09/23/20  [provider]  HYDROcodone-acetaminophen (NORCO/VICODIN) 5-325 MG tablet Take 1 tablet by mouth every 6 (six) hours as needed for moderate  pain. 01/27/20   Delman Kitten, MD  ibuprofen (ADVIL) 600 MG tablet Take 1 tablet (600 mg total) by mouth every 6 (six) hours as needed. 11/11/19   Tylene Fantasia, PA-C  lisinopril-hydrochlorothiazide (ZESTORETIC) 20-12.5 MG tablet Take 1 tablet by mouth daily. 11/17/19   [provider]  methocarbamol (ROBAXIN) 500 MG tablet Take 1 tablet (500 mg total) by mouth every 8 (eight) hours as needed for  muscle spasms. 11/11/19   Tylene Fantasia, PA-C  metoprolol tartrate (LOPRESSOR) 25 MG tablet Take 25 mg by mouth 2 (two) times daily. 07/12/19   [provider]  spironolactone (ALDACTONE) 25 MG tablet Take 25 mg by mouth daily.  09/10/19   [provider]  TRELEGY ELLIPTA 100-62.5-25 MCG/INH AEPB Inhale 1 puff into the lungs daily. 10/15/19   [provider]    Allergies Clopidogrel  Family History  Problem Relation Age of Onset  . Alzheimer's disease Mother   . Heart attack Father   . Diabetes Mellitus II Brother   . Diabetes Mellitus II Brother     Social History Social History   Tobacco Use  . Smoking status: Former Smoker    Packs/day: 0.25    Years: 49.00    Pack years: 12.25    Types: Cigarettes    Quit date: 10/17/2019    Years since quitting: 0.2  . Smokeless tobacco: Never Used  . Tobacco comment: 1 pack lasts 3-4 days  Vaping Use  . Vaping Use: Never used  Substance Use Topics  . Alcohol use: Yes    Comment: 1 shot of vodka with lemonade every evening  . Drug use: Never    Review of Systems Constitutional: No fever/chills Eyes: No visual changes. ENT: No sore throat.  Endorses left-sided neck swelling Cardiovascular: Denies chest pain. Respiratory: Denies shortness of breath. Gastrointestinal: No abdominal pain.  No nausea, no vomiting.  No diarrhea. Genitourinary: Negative for dysuria. Musculoskeletal: Negative for acute arthralgias Skin: Negative for rash. Neurological: Negative for headaches, weakness/numbness/paresthesias in any extremity Psychiatric: Negative for suicidal ideation/homicidal ideation   ____________________________________________   PHYSICAL EXAM:  VITAL SIGNS: ED Triage Vitals  Enc Vitals Group     BP 01/28/20 1725 (!) 155/88     Pulse Rate 01/28/20 1725 84     Resp 01/28/20 1725 20     Temp 01/28/20 1725 98.8 F (37.1 C)     Temp Source 01/28/20 1725 Oral     SpO2 01/28/20 1725 96 %      Weight 01/28/20 1726 120 lb (54.4 kg)     Height 01/28/20 1726 5\' 3"  (1.6 m)     Head Circumference --      Peak Flow --      Pain Score 01/28/20 1726 8     Pain Loc --      Pain Edu? --      Excl. in [place]? --    Constitutional: Alert and oriented. Well appearing and in no acute distress. Eyes: Conjunctivae are normal. PERRL. Head: Atraumatic. Nose: No congestion/rhinnorhea. Mouth/Throat: Mucous membranes are moist. Neck: No stridor.  Mild swelling to the left aspect of the anterior neck Cardiovascular: Grossly normal heart sounds.  Good peripheral circulation. Respiratory: Normal respiratory effort.  No retractions. Gastrointestinal: Soft and nontender. No distention. Musculoskeletal: No obvious deformities Neurologic:  Normal speech and language. No gross focal neurologic deficits are appreciated. Skin:  Skin is warm and dry. No rash noted. Psychiatric: Mood and affect are normal. Speech and behavior are  normal.  ____________________________________________   LABS (all labs ordered are listed, but only abnormal results are displayed)  Labs Reviewed - No data to display   RADIOLOGY  ED MD interpretation:   Official radiology report(s): CT Angio Chest PE W and/or Wo Contrast  Result Date: 01/27/2020 CLINICAL DATA:  Acute abdominal pain. Left-sided neck and shoulder swelling. History of lung cancer postoperative from November. EXAM: CT ANGIOGRAPHY CHEST CT ABDOMEN AND PELVIS WITH CONTRAST TECHNIQUE: Multidetector CT imaging of the chest was performed using the standard protocol during bolus administration of intravenous contrast. Multiplanar CT image reconstructions and MIPs were obtained to evaluate the vascular anatomy. Multidetector CT imaging of the abdomen and pelvis was performed using the standard protocol during bolus administration of intravenous contrast. CONTRAST:  68mL OMNIPAQUE IOHEXOL 350 MG/ML SOLN COMPARISON:  CT chest 11/13/2019 FINDINGS: CTA CHEST FINDINGS  Cardiovascular: Good opacification of the central and segmental pulmonary arteries. No focal filling defects. No evidence of significant pulmonary embolus. Normal caliber thoracic aorta. No aortic dissection. Great vessel origins are patent although there is evidence of significant stenosis at the origin of the left common carotid artery. Calcification in the aorta and coronary arteries. Mild cardiac enlargement. No pericardial effusions. Mediastinum/Nodes: Esophagus is decompressed. No significant lymphadenopathy in the chest. Lungs/Pleura: Emphysematous changes in the lungs. Surgical clips in the right middle lung. No airspace disease or consolidation in the lungs. Mild infiltration or atelectasis in the left costophrenic angle. No focal consolidation. No pulmonary nodules. Airways are patent. No pleural effusion or pneumothorax. Musculoskeletal: Healing fracture in a right lateral rib consistent with thoracotomy. Degenerative changes in the spine. Postoperative changes in the cervical spine. No destructive bone lesions. Review of the MIP images confirms the above findings. CT ABDOMEN and PELVIS FINDINGS Hepatobiliary: No focal liver abnormality is seen. No gallstones, gallbladder wall thickening, or biliary dilatation. Pancreas: Unremarkable. No pancreatic ductal dilatation or surrounding inflammatory changes. Spleen: Normal in size without focal abnormality. Adrenals/Urinary Tract: Adrenal glands are unremarkable. Kidneys are normal, without renal calculi, focal lesion, or hydronephrosis. Bladder is unremarkable. Stomach/Bowel: Stomach is within normal limits. Appendix appears normal. No evidence of bowel wall thickening, distention, or inflammatory changes. Vascular/Lymphatic: Aortic atherosclerosis. No enlarged abdominal or pelvic lymph nodes. Reproductive: Uterus and bilateral adnexa are unremarkable. Other: No abdominal wall hernia or abnormality. No abdominopelvic ascites. Musculoskeletal: Posterior  fixation at L4-5. No destructive bone lesions. Review of the MIP images confirms the above findings. IMPRESSION: 1. No evidence of significant pulmonary embolus. 2. Emphysematous changes in the lungs. 3. Mild infiltration or atelectasis in the left costophrenic angle. 4. Postoperative right thoracotomy. 5. No acute process demonstrated in the abdomen or pelvis. 6. Emphysema and aortic atherosclerosis. Left common carotid artery origin stenosis. Aortic Atherosclerosis (ICD10-I70.0) and Emphysema (ICD10-J43.9). Electronically Signed   By: Lucienne Capers M.D.   On: 01/27/2020 20:20   CT Cervical Spine Wo Contrast  Result Date: 01/27/2020 CLINICAL DATA:  Left-sided neck and shoulder swelling, history of lung cancer EXAM: CT CERVICAL SPINE WITHOUT CONTRAST TECHNIQUE: Multidetector CT imaging of the cervical spine was performed without intravenous contrast. Multiplanar CT image reconstructions were also generated. COMPARISON:  11/14/2019, 10/21/2019 FINDINGS: Alignment: Alignment is anatomic. Skull base and vertebrae: No acute fracture. No primary bone lesion or focal pathologic process. Soft tissues and spinal canal: Extensive atherosclerosis of the carotid vessels. 3.1 x 2.2 by 2.6 cm right lobe thyroid nodule. No prevertebral fluid or swelling. No visible canal hematoma. Disc levels: Previous ACDF spanning C4 through C7, with bony  fusion across the disc spaces. Spondylosis at C2-3 and to a lesser extent C3-4. Symmetrical neural foraminal encroachment at C2-3, with right neural foraminal encroachment at C3-4. Upper chest: Airway is patent. Lung apices are clear. Other: Reconstructed images demonstrate no additional findings. IMPRESSION: 1. No acute cervical spine fracture. 2. Previous ACDF spanning C4 through C7, with bony fusion across the disc spaces. 3. Spondylosis at C2-3 and to a lesser extent C3-4. Symmetrical neural foraminal encroachment at C2-3, with right neural foraminal encroachment at C3-4. 4. A 3.1  cm right lobe thyroid nodule. This has been evaluated on previous imaging. (ref: J Am Coll Radiol. 2015 Feb;12(2): 143-50). Electronically Signed   By: Randa Ngo M.D.   On: 01/27/2020 20:15   CT ABDOMEN PELVIS W CONTRAST  Result Date: 01/27/2020 CLINICAL DATA:  Acute abdominal pain. Left-sided neck and shoulder swelling. History of lung cancer postoperative from November. EXAM: CT ANGIOGRAPHY CHEST CT ABDOMEN AND PELVIS WITH CONTRAST TECHNIQUE: Multidetector CT imaging of the chest was performed using the standard protocol during bolus administration of intravenous contrast. Multiplanar CT image reconstructions and MIPs were obtained to evaluate the vascular anatomy. Multidetector CT imaging of the abdomen and pelvis was performed using the standard protocol during bolus administration of intravenous contrast. CONTRAST:  11mL OMNIPAQUE IOHEXOL 350 MG/ML SOLN COMPARISON:  CT chest 11/13/2019 FINDINGS: CTA CHEST FINDINGS Cardiovascular: Good opacification of the central and segmental pulmonary arteries. No focal filling defects. No evidence of significant pulmonary embolus. Normal caliber thoracic aorta. No aortic dissection. Great vessel origins are patent although there is evidence of significant stenosis at the origin of the left common carotid artery. Calcification in the aorta and coronary arteries. Mild cardiac enlargement. No pericardial effusions. Mediastinum/Nodes: Esophagus is decompressed. No significant lymphadenopathy in the chest. Lungs/Pleura: Emphysematous changes in the lungs. Surgical clips in the right middle lung. No airspace disease or consolidation in the lungs. Mild infiltration or atelectasis in the left costophrenic angle. No focal consolidation. No pulmonary nodules. Airways are patent. No pleural effusion or pneumothorax. Musculoskeletal: Healing fracture in a right lateral rib consistent with thoracotomy. Degenerative changes in the spine. Postoperative changes in the cervical  spine. No destructive bone lesions. Review of the MIP images confirms the above findings. CT ABDOMEN and PELVIS FINDINGS Hepatobiliary: No focal liver abnormality is seen. No gallstones, gallbladder wall thickening, or biliary dilatation. Pancreas: Unremarkable. No pancreatic ductal dilatation or surrounding inflammatory changes. Spleen: Normal in size without focal abnormality. Adrenals/Urinary Tract: Adrenal glands are unremarkable. Kidneys are normal, without renal calculi, focal lesion, or hydronephrosis. Bladder is unremarkable. Stomach/Bowel: Stomach is within normal limits. Appendix appears normal. No evidence of bowel wall thickening, distention, or inflammatory changes. Vascular/Lymphatic: Aortic atherosclerosis. No enlarged abdominal or pelvic lymph nodes. Reproductive: Uterus and bilateral adnexa are unremarkable. Other: No abdominal wall hernia or abnormality. No abdominopelvic ascites. Musculoskeletal: Posterior fixation at L4-5. No destructive bone lesions. Review of the MIP images confirms the above findings. IMPRESSION: 1. No evidence of significant pulmonary embolus. 2. Emphysematous changes in the lungs. 3. Mild infiltration or atelectasis in the left costophrenic angle. 4. Postoperative right thoracotomy. 5. No acute process demonstrated in the abdomen or pelvis. 6. Emphysema and aortic atherosclerosis. Left common carotid artery origin stenosis. Aortic Atherosclerosis (ICD10-I70.0) and Emphysema (ICD10-J43.9). Electronically Signed   By: Lucienne Capers M.D.   On: 01/27/2020 20:20   US Venous Img Upper Uni Left  Result Date: 01/27/2020 CLINICAL DATA:  Pain and swelling EXAM: LEFT UPPER EXTREMITY VENOUS DOPPLER ULTRASOUND TECHNIQUE: Gray-scale  sonography with graded compression, as well as color Doppler and duplex ultrasound were performed to evaluate the upper extremity deep venous system from the level of the subclavian vein and including the jugular, axillary, basilic, radial, ulnar and  upper cephalic vein. Spectral Doppler was utilized to evaluate flow at rest and with distal augmentation maneuvers. COMPARISON:  None. FINDINGS: There is no DVT identified involving the left upper extremity including the left subclavian vein, left axillary vein, brachial vein, radial vein, or ulnar vein. The cephalic vein was unremarkable. The jugular vein was unremarkable. There is some decreased phasicity throughout the left upper extremity which is of unknown clinical significance. IMPRESSION: 1. No definite DVT identified. 2. Diminished phasicity in the left upper extremity of unknown clinical significance. This may be secondary to an obstruction or stenosis more centrally. Attention on the patient's subsequent CT of the chest is recommended. Electronically Signed   By: Constance Holster M.D.   On: 01/27/2020 19:48    ____________________________________________   PROCEDURES  Procedure(s) performed (including Critical Care):  Procedures   ____________________________________________   INITIAL IMPRESSION / ASSESSMENT AND PLAN / ED COURSE  As part of my medical decision making, I reviewed the following data within the [facility] notes reviewed and incorporated, Labs reviewed, EKG interpreted, Old chart reviewed, Radiograph reviewed and Notes from prior ED visits reviewed and incorporated        Patient is a 65 year old female with a past medical history of lung cancer status post resection by Dr. Genevive Bi recently who presents for left-sided swelling that is worsened today.  Patient shows no worsening of symptoms from what was noted and the ED providers note from yesterday.  Patient is p.o. tolerant without difficulty.  Patient has no stridor, hoarse voice, or hot potato voice.  Patient had full work-up yesterday including Doppler ultrasound and CT angiography without any evidence of obstruction.  The patient has been reexamined and is ready to be discharged.  All  diagnostic results have been reviewed and discussed with the patient/family.  Care plan has been outlined and the patient/family understands all current diagnoses, results, and treatment plans.  There are no new complaints, changes, or physical findings at this time.  All questions have been addressed and answered.  Patient was instructed to, and agrees to follow-up with their primary care physician as well as return to the emergency department if any new or worsening symptoms develop.      ____________________________________________   FINAL CLINICAL IMPRESSION(S) / ED DIAGNOSES  Final diagnoses:  Pain and swelling of left shoulder  Neck swelling     ED Discharge Orders    None       Note:  This document was prepared using Dragon voice recognition software and may include unintentional dictation errors.   Naaman Plummer, MD 01/28/20 [phone]

## 2020-02-03 ENCOUNTER — Encounter: Payer: Self-pay | Admitting: General Surgery

## 2020-02-03 ENCOUNTER — Ambulatory Visit (INDEPENDENT_AMBULATORY_CARE_PROVIDER_SITE_OTHER): Payer: Medicare Other | Admitting: General Surgery

## 2020-02-03 ENCOUNTER — Other Ambulatory Visit: Payer: Self-pay

## 2020-02-03 VITALS — BP 145/91 | HR 79 | Temp 98.7°F | Ht 63.0 in | Wt 131.6 lb

## 2020-02-03 DIAGNOSIS — E041 Nontoxic single thyroid nodule: Secondary | ICD-10-CM

## 2020-02-03 NOTE — Patient Instructions (Signed)
Our surgery scheduler Pamala Hurry will contact you within 24-48 hours. If you have not heard anything by Friday, call our office. Please have the blue sheet available when she calls to write down important information. If you have concerns or questions, please feel free to call our office.     Thyroidectomy, Care After This sheet gives you information about how to care for yourself after your procedure. Your health care provider may also give you more specific instructions. If you have problems or questions, contact your health care provider. What can I expect after the procedure? After the procedure, it is common to have:  Mild pain in the neck or upper body, especially when swallowing.  A swollen neck.  A sore throat.  A weak or hoarse voice.  Slight tingling or numbness around your mouth, or in your fingers or toes. This may last for a day or two after surgery. This condition is caused by low levels of calcium. You may be given calcium supplements to treat it. Follow these instructions at home: Medicines  Take over-the-counter and prescription medicines only as told by your health care provider.  Do not drive or use heavy machinery while taking prescription pain medicine.  Do not take medicines that contain aspirin and ibuprofen until your health care provider says that you can. These medicines can increase your risk of bleeding.  Take a thyroid hormone medicine as recommended by your health care provider. You will have to take this medicine for the rest of your life if your entire thyroid was removed. Eating and drinking  Start slowly with eating. You may need to have only liquids and soft foods for a few days or as directed by your health care provider.  To prevent or treat constipation while you are taking prescription pain medicine, your health care provider may recommend that you: ? Drink enough fluid to keep your urine pale yellow. ? Take over-the-counter or prescription  medicines. ? Eat foods that are high in fiber, such as fresh fruits and vegetables, whole grains, and beans. ? Limit foods that are high in fat and processed sugars, such as fried and sweet foods. Incision care  Follow instructions from your health care provider about how to take care of your incision. Make sure you: ? Wash your hands with soap and water before you change your bandage (dressing). If soap and water are not available, use hand sanitizer. ? Change your dressing as told by your health care provider. ? Leave stitches (sutures), skin glue, or adhesive strips in place. These skin closures may need to stay in place for 2 weeks or longer. If adhesive strip edges start to loosen and curl up, you may trim the loose edges. Do not remove adhesive strips completely unless your health care provider tells you to do that.  Check your incision area every day for signs of infection. Check for: ? Redness, swelling, or pain. ? Fluid or blood. ? Warmth. ? Pus or a bad smell.  Do not take baths, swim, or use a hot tub until your health care provider approves. Activity  For the first 10 days after the procedure or as instructed by your health care provider: ? Do not lift anything that is heavier than 10 lb (4.5 kg). ? Do not jog, swim, or do other strenuous exercises. ? Do not play contact sports.  Avoid sitting for a long time without moving. Get up to take short walks every 1-2 hours. This is needed to improve blood  flow and breathing. Ask for help if you feel weak or unsteady.  Return to your normal activities as told by your health care provider. Ask your health care provider what activities are safe for you. General instructions  Do not use any products that contain nicotine or tobacco, such as cigarettes and e-cigarettes. These can delay healing after surgery. If you need help quitting, ask your health care provider.  Keep all follow-up visits as told by your health care provider. This  is important. Your health care provider needs to monitor the calcium level in your blood to make sure that it does not become low.   Contact a health care provider if you:  Have a fever.  Have more redness, swelling, or pain around your incision area.  Have fluid or blood coming from your incision area.  Notice that your incision area feels warm to the touch.  Have pus or a bad smell coming from your incision area.  Have trouble talking.  Have nausea or vomiting for more than 2 days. Get help right away if you:  Have trouble breathing.  Have trouble swallowing.  Develop a rash.  Develop a cough that gets worse.  Notice that your speech changes, or you have hoarseness that gets worse.  Develop numbness, tingling, or muscle spasms in the arms, hands, feet, or face. Summary  After the procedure, it is common to feel mild pain in the neck or upper body, especially when swallowing.  Take medicines as told by your health care provider. These include pain medicines and thyroid hormones, if required.  Follow instructions from your health care provider about how to take care of your incision. Watch for signs of infection.  Keep all follow-up visits as told by your health care provider. This is important. Your health care provider needs to monitor the calcium level in your blood to make sure that it does not become low.  Get help right away if you develop difficulty breathing, or numbness, tingling, or muscle spasms in the arms, hands, feet, or face. This information is not intended to replace advice given to you by your health care provider. Make sure you discuss any questions you have with your health care provider. Document Revised: 08/28/2019 Document Reviewed: 08/28/2019 Elsevier Patient Education  [place].

## 2020-02-03 NOTE — H&P (View-Only) (Signed)
Patient ID: Ann [name], female   DOB: 13-Jan-1955, 65 y.o.   MRN: [id number]  Chief Complaint  Patient presents with  . Follow-up    Thyroid nodule biopsy    HPI ANNASTYN [name] is a 65 y.o. female.   She is here today for follow-up of a PET avid thyroid nodule.  I initially saw her in October 2021.  My initial history of present illness is copied here:  "She has been referred by Dr. Genevive Bi for further evaluation and management of a thyroid nodule that was PET avid on a recent scan performed for a lung mass.  She reports that had the nodule not been discovered on other imaging, she would not be aware of its presence.  She denies any heart palpitations or hand tremors.  She does feel somewhat jittery and anxious, primarily due to her recent lung diagnosis.  She denies any dysphagia or frequent throat clearing.  No pressure sensation in her neck while supine.  No hoarseness or other voice changes.  No heat or cold intolerance.  She denies any changes in the texture of her hair, skin, or fingernails.  No concerns with diarrhea or constipation.  No increased fatigue.  She denies any unexplained weight loss or weight gain.  No ocular symptoms.  She says that 3 of her maternal aunts have undergone thyroid surgery, but she is not certain the reasons behind the operations.  She does not take any thyroid medication at this time.  She denies any occupational or therapeutic exposure to head or neck irradiation."  At that visit, she declined fine-needle aspiration biopsy, wishing to defer it until after she had her lung surgery.  Her surgery with Dr. Genevive Bi was completed on November 04, 2019.  It appears that her primary care provider was unaware of her scheduled follow-up with me for biopsy and referred her to endocrinology at the [facility] clinic.  Dr. Honor Junes performed a fine-needle aspiration biopsy of the nodule of concern.  It was Bethesda 3, atypia of undetermined significance.  Further evaluation via the  Afirma gene expression classifier was suspicious, carrying a 50% risk of malignancy.  The nodule was negative for RET/PTC or BRAF mutations.  She is here today to discuss surgical intervention.  She states that she continues to be asymptomatic from the nodule..  Overall, her mood and affect are significantly brighter than at the the time of our last visit.  She states that she is eager to get this taken care of so that she can go visit her grandchildren and great-grandchildren in [place].   Past Medical History:  Diagnosis Date  . Cancer ([place])    Lung  . COPD (chronic obstructive pulmonary disease) ([place])   . Coronary artery disease    Prior stenting  . Hypertension Hypoxic  . Hypokalemia   . Multiple thyroid nodules   . Myocardial infarction ([place])   . Stroke [facility]) 2005   TIA  . Syncope     Past Surgical History:  Procedure Laterality Date  . BACK SURGERY     DECOMRESSION L4-5 WITH RODS AND FUSION  . CAROTID STENT  07/18/2003  . NECK SURGERY    . THORACOTOMY/LOBECTOMY Right 11/04/2019   Procedure: THORACOTOMY/LOBECTOMY;  Surgeon: Nestor Lewandowsky, MD;  Location: [facility] ORS;  Service: Thoracic;  Laterality: Right;  . TONSILLECTOMY    . TUBAL LIGATION      Family History  Problem Relation Age of Onset  . Alzheimer's disease Mother   . Heart attack  Father   . Diabetes Mellitus II Brother   . Diabetes Mellitus II Brother     Social History Social History   Tobacco Use  . Smoking status: Former Smoker    Packs/day: 0.25    Years: 49.00    Pack years: 12.25    Types: Cigarettes    Quit date: 10/17/2019    Years since quitting: 0.2  . Smokeless tobacco: Never Used  . Tobacco comment: 1 pack lasts 3-4 days  Vaping Use  . Vaping Use: Never used  Substance Use Topics  . Alcohol use: Yes    Comment: 1 shot of vodka with lemonade every evening  . Drug use: Never    Allergies  Allergen Reactions  . Clopidogrel Anaphylaxis and Hives    Current Outpatient Medications   Medication Sig Dispense Refill  . aspirin 325 MG tablet Take 325 mg by mouth daily.     . fluticasone (FLONASE) 50 MCG/ACT nasal spray Place 2 sprays into both nostrils daily as needed for allergies.     . furosemide (LASIX) 20 MG tablet Take 20 mg by mouth daily as needed for edema.     . metoprolol tartrate (LOPRESSOR) 25 MG tablet Take 25 mg by mouth 2 (two) times daily.    . TRELEGY ELLIPTA 100-62.5-25 MCG/INH AEPB Inhale 1 puff into the lungs daily.    Marland Kitchen azithromycin (ZITHROMAX) 250 MG tablet 1 tab by mouth daily 4 tablet 0  . HYDROcodone-acetaminophen (NORCO/VICODIN) 5-325 MG tablet Take 1 tablet by mouth every 6 (six) hours as needed for moderate pain. 12 tablet 0  . ibuprofen (ADVIL) 600 MG tablet Take 1 tablet (600 mg total) by mouth every 6 (six) hours as needed. 30 tablet 0  . lisinopril-hydrochlorothiazide (ZESTORETIC) 20-12.5 MG tablet Take 1 tablet by mouth daily.    . methocarbamol (ROBAXIN) 500 MG tablet Take 1 tablet (500 mg total) by mouth every 8 (eight) hours as needed for muscle spasms. 30 tablet 0  . spironolactone (ALDACTONE) 25 MG tablet Take 25 mg by mouth daily.      No current facility-administered medications for this visit.    Review of Systems Review of Systems  Cardiovascular: Positive for leg swelling.  All other systems reviewed and are negative. Or as discussed in the history of present illness.  Blood pressure (!) 145/91, pulse 79, temperature 98.7 F (37.1 C), temperature source Oral, height 5' 3"  (1.6 m), weight 131 lb 9.6 oz (59.7 kg), SpO2 99 %. Body mass index is 23.31 kg/m.  Physical Exam Physical Exam Constitutional:      General: She is not in acute distress.    Appearance: Normal appearance. She is normal weight.  HENT:     Head: Normocephalic and atraumatic.     Nose:     Comments: Covered with a mask    Mouth/Throat:     Comments: Covered with a mask Eyes:     General: No scleral icterus.       Right eye: No discharge.         Left eye: No discharge.     Comments: No proptosis or exophthalmos.  No lid lag or stare.  Neck:     Comments: The trachea is midline.  There is no palpable cervical or supraclavicular lymphadenopathy.  The thyroid is asymmetrically enlarged, right greater than left.  The gland moves freely with deglutition. Cardiovascular:     Rate and Rhythm: Normal rate and regular rhythm.     Pulses: Normal pulses.  Pulmonary:     Effort: Pulmonary effort is normal.     Breath sounds: Normal breath sounds.  Abdominal:     General: Bowel sounds are normal.     Palpations: Abdomen is soft.  Genitourinary:    Comments: Deferred Musculoskeletal:        General: No swelling or tenderness.  Skin:    General: Skin is warm and dry.  Neurological:     Mental Status: She is alert and oriented to person, place, and time.  Psychiatric:        Mood and Affect: Mood normal.        Behavior: Behavior normal.     Data Reviewed Via the electronic medical record, I was able to review her clinic visit with Dr. Honor Junes.  His office faxed the [place] cytopathology and genomic sequencing to my office for my review.  These results are discussed in the history of present illness.  Labs were also performed by Dr. Honor Junes.  Her TSH on December 10, 2019 was within normal range at 0.650.  No new imaging was available for my review.  I did review the previous imaging including a thyroid ultrasound performed by myself in clinic demonstrating bilateral thyroid nodules with a dominant right-sided nodule.  I also reviewed the PET scan that initially identified the avid thyroid nodule.  Assessment This is a 65 year old woman with a history of lung cancer, now status post resection.  During her evaluation for this entity, a PET avid thyroid nodule was discovered.  After completing her lung cancer surgery and recovery, she had a fine-needle aspiration biopsy which was indeterminate on cytopathology but suspicious by Afirma.  She  is interested in surgical resection.The risks of thyroid surgery were discussed, including (but not limited to): bleeding, infection, damage to surrounding structures/tissues, injury (temporary or permanent) to the recurrent laryngeal nerve, hypoparathyroidism (temporary or permanent), need for thyroid hormone replacement therapy, need for additional surgery and/or treatment, recurrence of disease, tracheostomy (temporary or permanent).  The patient had the opportunity to ask any questions and these were answered to their satisfaction.  After discussion of the pros and cons of thyroid lobectomy versus total thyroidectomy, she expressed a desire to have a total thyroidectomy.  Plan We will work on getting her scheduled for her operation.  Due to the suspicion for cancer, I believe we can schedule her as a priority 1.    [name] 02/03/2020, 11:36 AM

## 2020-02-03 NOTE — Progress Notes (Signed)
Patient ID: Ann [name], female   DOB: 1955/02/07, 65 y.o.   MRN: [id number]  Chief Complaint  Patient presents with  . Follow-up    Thyroid nodule biopsy    HPI Ann [name] is a 65 y.o. female.   She is here today for follow-up of a PET avid thyroid nodule.  I initially saw her in October 2021.  My initial history of present illness is copied here:  "She has been referred by Dr. Genevive Bi for further evaluation and management of a thyroid nodule that was PET avid on a recent scan performed for a lung mass.  She reports that had the nodule not been discovered on other imaging, she would not be aware of its presence.  She denies any heart palpitations or hand tremors.  She does feel somewhat jittery and anxious, primarily due to her recent lung diagnosis.  She denies any dysphagia or frequent throat clearing.  No pressure sensation in her neck while supine.  No hoarseness or other voice changes.  No heat or cold intolerance.  She denies any changes in the texture of her hair, skin, or fingernails.  No concerns with diarrhea or constipation.  No increased fatigue.  She denies any unexplained weight loss or weight gain.  No ocular symptoms.  She says that 3 of her maternal aunts have undergone thyroid surgery, but she is not certain the reasons behind the operations.  She does not take any thyroid medication at this time.  She denies any occupational or therapeutic exposure to head or neck irradiation."  At that visit, she declined fine-needle aspiration biopsy, wishing to defer it until after she had her lung surgery.  Her surgery with Dr. Genevive Bi was completed on November 04, 2019.  It appears that her primary care provider was unaware of her scheduled follow-up with me for biopsy and referred her to endocrinology at the [facility] clinic.  Dr. Honor Junes performed a fine-needle aspiration biopsy of the nodule of concern.  It was Bethesda 3, atypia of undetermined significance.  Further evaluation via the  Afirma gene expression classifier was suspicious, carrying a 50% risk of malignancy.  The nodule was negative for RET/PTC or BRAF mutations.  She is here today to discuss surgical intervention.  She states that she continues to be asymptomatic from the nodule..  Overall, her mood and affect are significantly brighter than at the the time of our last visit.  She states that she is eager to get this taken care of so that she can go visit her grandchildren and great-grandchildren in [place].   Past Medical History:  Diagnosis Date  . Cancer ([place])    Lung  . COPD (chronic obstructive pulmonary disease) ([place])   . Coronary artery disease    Prior stenting  . Hypertension Hypoxic  . Hypokalemia   . Multiple thyroid nodules   . Myocardial infarction ([place])   . Stroke [facility]) 2005   TIA  . Syncope     Past Surgical History:  Procedure Laterality Date  . BACK SURGERY     DECOMRESSION L4-5 WITH RODS AND FUSION  . CAROTID STENT  07/18/2003  . NECK SURGERY    . THORACOTOMY/LOBECTOMY Right 11/04/2019   Procedure: THORACOTOMY/LOBECTOMY;  Surgeon: Nestor Lewandowsky, MD;  Location: [facility] ORS;  Service: Thoracic;  Laterality: Right;  . TONSILLECTOMY    . TUBAL LIGATION      Family History  Problem Relation Age of Onset  . Alzheimer's disease Mother   . Heart attack  Father   . Diabetes Mellitus II Brother   . Diabetes Mellitus II Brother     Social History Social History   Tobacco Use  . Smoking status: Former Smoker    Packs/day: 0.25    Years: 49.00    Pack years: 12.25    Types: Cigarettes    Quit date: 10/17/2019    Years since quitting: 0.2  . Smokeless tobacco: Never Used  . Tobacco comment: 1 pack lasts 3-4 days  Vaping Use  . Vaping Use: Never used  Substance Use Topics  . Alcohol use: Yes    Comment: 1 shot of vodka with lemonade every evening  . Drug use: Never    Allergies  Allergen Reactions  . Clopidogrel Anaphylaxis and Hives    Current Outpatient Medications   Medication Sig Dispense Refill  . aspirin 325 MG tablet Take 325 mg by mouth daily.     . fluticasone (FLONASE) 50 MCG/ACT nasal spray Place 2 sprays into both nostrils daily as needed for allergies.     . furosemide (LASIX) 20 MG tablet Take 20 mg by mouth daily as needed for edema.     . metoprolol tartrate (LOPRESSOR) 25 MG tablet Take 25 mg by mouth 2 (two) times daily.    . TRELEGY ELLIPTA 100-62.5-25 MCG/INH AEPB Inhale 1 puff into the lungs daily.    Marland Kitchen azithromycin (ZITHROMAX) 250 MG tablet 1 tab by mouth daily 4 tablet 0  . HYDROcodone-acetaminophen (NORCO/VICODIN) 5-325 MG tablet Take 1 tablet by mouth every 6 (six) hours as needed for moderate pain. 12 tablet 0  . ibuprofen (ADVIL) 600 MG tablet Take 1 tablet (600 mg total) by mouth every 6 (six) hours as needed. 30 tablet 0  . lisinopril-hydrochlorothiazide (ZESTORETIC) 20-12.5 MG tablet Take 1 tablet by mouth daily.    . methocarbamol (ROBAXIN) 500 MG tablet Take 1 tablet (500 mg total) by mouth every 8 (eight) hours as needed for muscle spasms. 30 tablet 0  . spironolactone (ALDACTONE) 25 MG tablet Take 25 mg by mouth daily.      No current facility-administered medications for this visit.    Review of Systems Review of Systems  Cardiovascular: Positive for leg swelling.  All other systems reviewed and are negative. Or as discussed in the history of present illness.  Blood pressure (!) 145/91, pulse 79, temperature 98.7 F (37.1 C), temperature source Oral, height 5' 3"  (1.6 m), weight 131 lb 9.6 oz (59.7 kg), SpO2 99 %. Body mass index is 23.31 kg/m.  Physical Exam Physical Exam Constitutional:      General: She is not in acute distress.    Appearance: Normal appearance. She is normal weight.  HENT:     Head: Normocephalic and atraumatic.     Nose:     Comments: Covered with a mask    Mouth/Throat:     Comments: Covered with a mask Eyes:     General: No scleral icterus.       Right eye: No discharge.         Left eye: No discharge.     Comments: No proptosis or exophthalmos.  No lid lag or stare.  Neck:     Comments: The trachea is midline.  There is no palpable cervical or supraclavicular lymphadenopathy.  The thyroid is asymmetrically enlarged, right greater than left.  The gland moves freely with deglutition. Cardiovascular:     Rate and Rhythm: Normal rate and regular rhythm.     Pulses: Normal pulses.  Pulmonary:     Effort: Pulmonary effort is normal.     Breath sounds: Normal breath sounds.  Abdominal:     General: Bowel sounds are normal.     Palpations: Abdomen is soft.  Genitourinary:    Comments: Deferred Musculoskeletal:        General: No swelling or tenderness.  Skin:    General: Skin is warm and dry.  Neurological:     Mental Status: She is alert and oriented to person, place, and time.  Psychiatric:        Mood and Affect: Mood normal.        Behavior: Behavior normal.     Data Reviewed Via the electronic medical record, I was able to review her clinic visit with Dr. Honor Junes.  His office faxed the [place] cytopathology and genomic sequencing to my office for my review.  These results are discussed in the history of present illness.  Labs were also performed by Dr. Honor Junes.  Her TSH on December 10, 2019 was within normal range at 0.650.  No new imaging was available for my review.  I did review the previous imaging including a thyroid ultrasound performed by myself in clinic demonstrating bilateral thyroid nodules with a dominant right-sided nodule.  I also reviewed the PET scan that initially identified the avid thyroid nodule.  Assessment This is a 65 year old woman with a history of lung cancer, now status post resection.  During her evaluation for this entity, a PET avid thyroid nodule was discovered.  After completing her lung cancer surgery and recovery, she had a fine-needle aspiration biopsy which was indeterminate on cytopathology but suspicious by Afirma.  She  is interested in surgical resection.The risks of thyroid surgery were discussed, including (but not limited to): bleeding, infection, damage to surrounding structures/tissues, injury (temporary or permanent) to the recurrent laryngeal nerve, hypoparathyroidism (temporary or permanent), need for thyroid hormone replacement therapy, need for additional surgery and/or treatment, recurrence of disease, tracheostomy (temporary or permanent).  The patient had the opportunity to ask any questions and these were answered to their satisfaction.  After discussion of the pros and cons of thyroid lobectomy versus total thyroidectomy, she expressed a desire to have a total thyroidectomy.  Plan We will work on getting her scheduled for her operation.  Due to the suspicion for cancer, I believe we can schedule her as a priority 1.    [name] 02/03/2020, 11:36 AM

## 2020-02-04 ENCOUNTER — Telehealth: Payer: Self-pay | Admitting: General Surgery

## 2020-02-04 NOTE — Telephone Encounter (Signed)
Patient has been advised of Pre-Admission date/time, COVID Testing date and Surgery date.  Surgery Date: 02/25/20 Preadmission Testing Date: 02/16/20 (phone 1p-5p) Covid Testing Date: 02/23/20 - patient advised to go to the [facility] between 8a-1p  Patient has been made aware to call [phone], between 1-3:00pm the day before surgery, to find out what time to arrive for surgery.

## 2020-02-10 ENCOUNTER — Telehealth: Payer: Self-pay

## 2020-02-10 DIAGNOSIS — Z9889 Other specified postprocedural states: Secondary | ICD-10-CM

## 2020-02-10 NOTE — Telephone Encounter (Signed)
Patient notified to have chest xray prior to appointment 02/11/20.

## 2020-02-11 ENCOUNTER — Ambulatory Visit (INDEPENDENT_AMBULATORY_CARE_PROVIDER_SITE_OTHER): Payer: Medicare Other | Admitting: Surgery

## 2020-02-11 ENCOUNTER — Encounter: Payer: Self-pay | Admitting: Surgery

## 2020-02-11 ENCOUNTER — Other Ambulatory Visit: Payer: Self-pay

## 2020-02-11 ENCOUNTER — Ambulatory Visit
Admission: RE | Admit: 2020-02-11 | Discharge: 2020-02-11 | Disposition: A | Discharge status: Home / Self Care | Payer: Medicare Other | Source: Ambulatory Visit | Attending: Surgery | Admitting: Surgery

## 2020-02-11 VITALS — BP 141/84 | HR 79 | Temp 98.6°F | Wt 132.8 lb

## 2020-02-11 DIAGNOSIS — I639 Cerebral infarction, unspecified: Secondary | ICD-10-CM

## 2020-02-11 DIAGNOSIS — Z9889 Other specified postprocedural states: Secondary | ICD-10-CM | POA: Diagnosis not present

## 2020-02-11 DIAGNOSIS — G8912 Acute post-thoracotomy pain: Secondary | ICD-10-CM | POA: Diagnosis not present

## 2020-02-11 DIAGNOSIS — R911 Solitary pulmonary nodule: Secondary | ICD-10-CM

## 2020-02-11 NOTE — Patient Instructions (Signed)
Please call our office if you have questions or concerns.   

## 2020-02-11 NOTE — Progress Notes (Signed)
Outpatient Surgical Follow Up  02/11/2020  Ann [name] is an 65 y.o. female.   Chief Complaint  Patient presents with  . 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 She continues to have some intermittent pains in the right chest wall that is intermittent mild to moderate any density and seems to be worsening with deep inspiration.  No fevers no chills no shortness of breath.  She is walking she is eating that she has gained weight.  I have personally reviewed her chest x-ray showing good expansion of the right side no evidence of pneumothorax no evidence of new lesions.  Past Medical History:  Diagnosis Date  . Cancer ([place])    Lung  . COPD (chronic obstructive pulmonary disease) ([place])   . Coronary artery disease    Prior stenting  . Hypertension Hypoxic  . Hypokalemia   . Multiple thyroid nodules   . Myocardial infarction ([place])   . Stroke [facility]) 2005   TIA  . Syncope     Past Surgical History:  Procedure Laterality Date  . BACK SURGERY     DECOMRESSION L4-5 WITH RODS AND FUSION  . CAROTID STENT  07/18/2003  . NECK SURGERY    . THORACOTOMY/LOBECTOMY Right 11/04/2019   Procedure: THORACOTOMY/LOBECTOMY;  Surgeon: Nestor Lewandowsky, MD;  Location: [facility] ORS;  Service: Thoracic;  Laterality: Right;  . TONSILLECTOMY    . TUBAL LIGATION      Family History  Problem Relation Age of Onset  . Alzheimer's disease Mother   . Heart attack Father   . Diabetes Mellitus II Brother   . Diabetes Mellitus II Brother     Social History:  reports that she quit smoking about 3 months ago. Her smoking use included cigarettes. She has a 12.25 pack-year smoking history. She has never used smokeless tobacco. She reports current alcohol use. She reports that she does not use drugs.  Allergies:  Allergies  Allergen Reactions  . Clopidogrel  Anaphylaxis and Hives    Medications reviewed.    ROS Full ROS performed and is otherwise negative other than what is stated in HPI   BP (!) 141/84   Pulse 79   Temp 98.6 F (37 C) (Oral)   Wt 132 lb 12.8 oz (60.2 kg)   SpO2 98%   BMI 23.52 kg/m   Physical Exam Vitals and nursing note reviewed. Exam conducted with a chaperone present.  Constitutional:      General: She is not in acute distress.    Appearance: Normal appearance. She is normal weight.  Cardiovascular:     Rate and Rhythm: Normal rate and regular rhythm.  Pulmonary:     Effort: Pulmonary effort is normal. No respiratory distress.     Breath sounds: Normal breath sounds.     Comments: Evidence of right thoracotomy.  Incision well-healed without evidence of infection or hematomas.  There is mild tenderness to palpation on right chest wall Abdominal:     General: Abdomen is flat. There is no distension.     Palpations: Abdomen is soft. There is no mass.     Tenderness: There is no abdominal tenderness.     Hernia: No hernia is present.  Musculoskeletal:     Cervical back: Normal range of motion and neck supple. No rigidity or tenderness.  Skin:    Capillary Refill: Capillary  refill takes less than 2 seconds.  Neurological:     General: No focal deficit present.     Mental Status: She is alert and oriented to person, place, and time.  Psychiatric:        Mood and Affect: Mood normal.        Behavior: Behavior normal.        Thought Content: Thought content normal.        Judgment: Judgment normal.     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  Also with patient about her disease process.  This pains can last for up to 6 months to a year and she is only about 3 months out.  Discussed about different type of pharmacological options currently she is content with taking Tylenol on a as needed basis.  There is no evidence of recurrence and there is no evidence  of any complications related to her thoracotomy. She already has follow-up with Dr. Janese Banks from oncology in 3 months.   Greater than 50% of the 25 minutes  visit was spent in counseling/coordination of care   Caroleen Hamman, MD [place] Surgeon

## 2020-02-12 ENCOUNTER — Ambulatory Visit (INDEPENDENT_AMBULATORY_CARE_PROVIDER_SITE_OTHER): Payer: Medicare Other

## 2020-02-12 DIAGNOSIS — I639 Cerebral infarction, unspecified: Secondary | ICD-10-CM | POA: Diagnosis not present

## 2020-02-16 ENCOUNTER — Other Ambulatory Visit: Payer: Self-pay

## 2020-02-16 ENCOUNTER — Other Ambulatory Visit
Admission: RE | Admit: 2020-02-16 | Discharge: 2020-02-16 | Disposition: A | Discharge status: Home / Self Care | Payer: Medicare Other | Source: Ambulatory Visit | Attending: General Surgery | Admitting: General Surgery

## 2020-02-16 DIAGNOSIS — Z01818 Encounter for other preprocedural examination: Secondary | ICD-10-CM | POA: Insufficient documentation

## 2020-02-16 HISTORY — DX: Gastro-esophageal reflux disease without esophagitis: K21.9

## 2020-02-16 LAB — CUP PACEART REMOTE DEVICE CHECK
Date Time Interrogation Session: 20220214082250
Implantable Pulse Generator Implant Date: 20210928

## 2020-02-16 NOTE — Patient Instructions (Addendum)
Your procedure is scheduled on: 02/25/20- Wednesday Report to the Registration Desk on the 1st floor of the [facility]. To find out your arrival time, please call [phone] between 1PM - 3PM on: 02/24/20- Tuesday  REMEMBER: Instructions that are not followed completely may result in serious medical risk, up to and including death; or upon the discretion of your surgeon and anesthesiologist your surgery may need to be rescheduled.  Do not eat food after midnight the night before surgery.  No gum chewing, lozengers or hard candies.  You may however, drink CLEAR liquids up to 2 hours before you are scheduled to arrive for your surgery. Do not drink anything within 2 hours of your scheduled arrival time.  Clear liquids include: - water  - apple juice without pulp - gatorade (not RED, PURPLE, OR BLUE) - black coffee or tea (Do NOT add milk or creamers to the coffee or tea) Do NOT drink anything that is not on this list.  Type 1 and Type 2 diabetics should only drink water.  TAKE THESE MEDICATIONS THE MORNING OF SURGERY WITH A SIP OF WATER:  - metoprolol tartrate (LOPRESSOR) 25 MG tablet - pantoprazole (PROTONIX) 40 MG tablet, take one the night before and one on the morning of surgery - helps to prevent nausea after surgery. - TRELEGY ELLIPTA 100-62.5-25 MCG/INH AEPB   - potassium chloride (KLOR-CON) 10 MEQ tablet Take one tablet the night before surgery along with your regular morning dose on 02/24/20 ,  But do not take the morning of surgery.  Follow recommendations from Cardiologist, Pulmonologist or PCP regarding stopping Aspirin, Coumadin, Plavix, Eliquis, Pradaxa, or Pletal. Per Dr. Celine Ahr stop Aspirin 325 mg starting 02/17/20.  One week prior to surgery: Stop Anti-inflammatories (NSAIDS) such as Advil, Aleve, Ibuprofen, Motrin, Naproxen, Naprosyn and Aspirin based products such as Excedrin, Goodys Powder, BC Powder.   Stop ANY OVER THE COUNTER supplements until after  surgery. (However, you may continue taking Vitamin D, Vitamin B, and multivitamin up until the day before surgery.)  No Alcohol for 24 hours before or after surgery.  No Smoking including e-cigarettes for 24 hours prior to surgery.  No chewable tobacco products for at least 6 hours prior to surgery.  No nicotine patches on the day of surgery.  Do not use any "recreational" drugs for at least a week prior to your surgery.  Please be advised that the combination of cocaine and anesthesia may have negative outcomes, up to and including death. If you test positive for cocaine, your surgery will be cancelled.  On the morning of surgery brush your teeth with toothpaste and water, you may rinse your mouth with mouthwash if you wish. Do not swallow any toothpaste or mouthwash.  Do not wear jewelry, make-up, hairpins, clips or nail polish.  Do not wear lotions, powders, or perfumes.   Do not shave body from the neck down 48 hours prior to surgery just in case you cut yourself which could leave a site for infection.  Also, freshly shaved skin may become irritated if using the CHG soap.  Contact lenses, hearing aids and dentures may not be worn into surgery.  Do not bring valuables to the hospital. [facility] is not responsible for any missing/lost belongings or valuables.   Use CHG Soap or wipes as directed on instruction sheet.  Notify your doctor if there is any change in your medical condition (cold, fever, infection).  Wear comfortable clothing (specific to your surgery type) to the hospital.  Plan for stool softeners for home use; pain medications have a tendency to cause constipation. You can also help prevent constipation by eating foods high in fiber such as fruits and vegetables and drinking plenty of fluids as your diet allows.  After surgery, you can help prevent lung complications by doing breathing exercises.  Take deep breaths and cough every 1-2 hours. Your doctor may order  a device called an Incentive Spirometer to help you take deep breaths. When coughing or sneezing, hold a pillow firmly against your incision with both hands. This is called "splinting." Doing this helps protect your incision. It also decreases belly discomfort.  If you are being admitted to the hospital overnight, leave your suitcase in the car. After surgery it may be brought to your room.  If you are being discharged the day of surgery, you will not be allowed to drive home. You will need a responsible adult (18 years or older) to drive you home and stay with you that night.   If you are taking public transportation, you will need to have a responsible adult (18 years or older) with you. Please confirm with your physician that it is acceptable to use public transportation.   Please call the [facility] at [phone] if you have any questions about these instructions.  Visitation Policy:  Patients undergoing a surgery or procedure may have one family member or support person with them as long as that person is not COVID-19 positive or experiencing its symptoms.  That person may remain in the waiting area during the procedure.  Inpatient Visitation:    Visiting hours are 7 a.m. to 8 p.m. Patients will be allowed one visitor. The visitor may change daily. The visitor must pass COVID-19 screenings, use hand sanitizer when entering and exiting the patient's room and wear a mask at all times, including in the patient's room. Patients must also wear a mask when staff or their visitor are in the room. Masking is required regardless of vaccination status. Systemwide, no visitors 17 or younger.

## 2020-02-18 NOTE — Progress Notes (Signed)
Carelink Summary Report / Loop Recorder 

## 2020-02-20 ENCOUNTER — Encounter: Payer: Self-pay | Admitting: General Surgery

## 2020-02-20 NOTE — Progress Notes (Signed)
Perioperative Services  Pre-Admission/Anesthesia Testing Clinical Review  Date: 02/20/20  Patient Demographics:  Name: Ann [name] DOB:   1955-09-23 MRN:   [id number]  Planned Surgical Procedure(s):    Case: [id number] Date/Time: 02/25/20 0715   Procedure: THYROIDECTOMY, total (N/A ) - Provider requesting 2.5 hours/150 minutes for procedure.   Anesthesia type: General   Pre-op diagnosis: multinodular goiter biopsy suspicious   Location: [facility] OR ROOM 06 / [facility] ORS FOR ANESTHESIA GROUP   Surgeons: [name], MD    NOTE: Available PAT nursing documentation and vital signs have been reviewed. Clinical nursing staff has updated patient's PMH/PSHx, current medication list, and drug allergies/intolerances to ensure comprehensive history available to assist in medical decision making as it pertains to the aforementioned surgical procedure and anticipated anesthetic course.   Clinical Discussion:  Ann [name] is a 65 y.o. female who is submitted for pre-surgical anesthesia review and clearance prior to her undergoing the above procedure. Patient is a Former Smoker (12.25 pack years; quit 10/2019). Pertinent PMH includes: CAD, MI (09/1998), aortic atherosclerosis, carotid artery stenosis, CVA, HTN, HLD, COPD, lung cancer, GERD, multiple thyroid nodules.  Patient is followed by cardiology Saralyn Pilar, MD). She was last seen in the cardiology clinic on 10/23/2019; notes reviewed.  At the time of her clinic visit, patient doing well overall from a cardiovascular perspective.  She reported that she was "doing good" overall.  Patient denied any chest pain, however she reported chronic exertional dyspnea related to her underlying COPD diagnosis.  Patient also continued to smoke 0.5 ppd at that time.  Patient denied any PND, orthopnea, palpitations, peripheral edema, vertiginous symptoms, or any recurrent presyncope/syncope.  Patient previously seen back in 09/22/2018 following a syncopal episode  whereby she lost consciousness for at least 5 minutes; did not seek medical attention.  Patient underwent loop recorder implantation on 09/30/2019 and has been doing well since.  Patient with a PMH (+) for MI back in 09/1998.  Patient underwent PCI whereby a NIR stent was placed to her RCA.  Most recent noninvasive cardiovascular testing includes a TTE that was performed on 10/01/2018 that revealed normal left ventricular systolic function with mild LVH; LVEF 55% (see full interpretation of cardiovascular test below).  Patient is on GDMT for her HTN and HLD diagnoses.  Blood pressure well controlled at 138/80 on prescribed diuretic and beta-blocker therapy.  Patient is on a statin for HLD.  No changes were made to patient's medication regimen.  Patient follow-up with outpatient cardiology in 6 months or sooner if needed.  Patient is status post recent pulmonary lobectomy.  Postoperative biopsy revealed mucinous adenocarcinoma.  Patient doing well overall following lobectomy.  She was recently seen in the ED on 01/27/2020 and on 01/28/2020 for swelling in her neck; notes reviewed.  At the time she was seen in the ED, patient reported that she was "afraid to eat, drink, or go to sleep".  Patient underwent CT imaging of her cervical spine, chest, abdomen, and pelvis.  Significant findings included a 3.1 x 2.2 x 2.6 cm RIGHT lobe thyroid nodule. Of note, PET scan performed on 10/02/2019 revealed FDG avid thyroid nodule concerning for thyroid carcinoma versus adenoma.  Patient previously offered biopsy, however she wanted to "get through her lung surgery first" and deferred the procedure.  Following her ED visit, patient was referred to endocrinology and a FNA biopsy was performed revealing indeterminate cytopathological results. 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  Nodule was negative for RET/PTC  and BRAF mutations.  General surgeon discussed risk and benefits associated with  partial thyroid lobectomy versus total thyroidectomy and questions were fielded by physician.  Patient electing to proceed with total resection.  Patient scheduled to undergo total thyroidectomy on 02/25/2020 with Dr. [name].  Given patient's past medical history significant for cardiovascular disease and multiple other various comorbidities, presurgical cardiac and internal medicine clearances were sought by the attending surgeons office and PAT team.  Specialty clearances were obtained as follows:   Per internal medicine, "patient recently seen on 02/05/2020.  Patient is at a MODERATE to HIGH risk for a low risk procedure, and is cleared to proceed as planned".   Per cardiology, "this patient is optimized for surgery and may proceed with the planned procedural course with a LOW risk stratification".  This patient is on daily antiplatelet therapy; takes ASA 325 mg daily.  She has been instructed on recommendations from her cardiologist for holding her daily aspirin dose for 7 days prior to her procedure with plans to restart as soon as postoperative bleeding risk felt to be minimized by her attending surgeon.  Patient has been instructed that her last dose of aspirin will be on 02/17/2020.   Patient denies previous perioperative complications with anesthesia in the past. In review of the available records, it is noted that patient underwent a general anesthetic course here (ASA III) in 11/2019 without documented complications.   Vitals with BMI 02/11/2020 02/03/2020 01/28/2020  Height - _0  _1   Weight 132 lbs 13 oz 131 lbs 10 oz 120 lbs  BMI 23.53 09.32 35.57  Systolic 322 025 427  Diastolic 84 91 88  Pulse 79 79 84    Providers/Specialists:   NOTE: Primary physician provider listed below. Patient may have been seen by APP or partner within same practice.   PROVIDER ROLE / SPECIALTY LAST Lucy Antigua, MD General Surgery  02/03/2020  Kirk Ruths, MD Primary Care  Provider  02/05/2020  Isaias Cowman, MD Cardiology  10/14/2019  Mee Hives, MD Endocrinology  01/08/2020  Adam Phenix, MD Cardiology/Electrophysiology  09/30/2019  Randa Evens, MD Oncology  11/20/2019   Allergies:  Clopidogrel  Current Home Medications:   No current facility-administered medications for this encounter.   Marland Kitchen aspirin 325 MG tablet  . fluticasone (FLONASE) 50 MCG/ACT nasal spray  . furosemide (LASIX) 20 MG tablet  . metoprolol tartrate (LOPRESSOR) 25 MG tablet  . pantoprazole (PROTONIX) 40 MG tablet  . potassium chloride (KLOR-CON) 10 MEQ tablet  . rosuvastatin (CRESTOR) 20 MG tablet  . TRELEGY ELLIPTA 100-62.5-25 MCG/INH AEPB   History:   Past Medical History:  Diagnosis Date  . Cancer ([place])    Lung  . COPD (chronic obstructive pulmonary disease) ([place])   . Coronary artery disease    Prior stenting  . GERD (gastroesophageal reflux disease)   . Hypertension Hypoxic  . Hypokalemia   . Multiple thyroid nodules   . Myocardial infarction ([place])   . Stroke [facility]) 2005   TIA  . Syncope    Past Surgical History:  Procedure Laterality Date  . BACK SURGERY     DECOMRESSION L4-5 WITH RODS AND FUSION  . CAROTID STENT  07/18/2003  . COLONOSCOPY    . NECK SURGERY    . THORACOTOMY/LOBECTOMY Right 11/04/2019   Procedure: THORACOTOMY/LOBECTOMY;  Surgeon: Nestor Lewandowsky, MD;  Location: [facility] ORS;  Service: Thoracic;  Laterality: Right;  . TONSILLECTOMY    . TUBAL LIGATION  Family History  Problem Relation Age of Onset  . Alzheimer's disease Mother   . Heart attack Father   . Diabetes Mellitus II Brother   . Diabetes Mellitus II Brother    Social History   Tobacco Use  . Smoking status: Former Smoker    Packs/day: 0.25    Years: 49.00    Pack years: 12.25    Types: Cigarettes    Quit date: 10/17/2019    Years since quitting: 0.3  . Smokeless tobacco: Never Used  . Tobacco comment: 1 pack lasts 3-4 days  Vaping Use  . Vaping Use: Never  used  Substance Use Topics  . Alcohol use: Yes    Comment: 1 shot of vodka with lemonade every evening  . Drug use: Never    Pertinent Clinical Results:  LABS: Labs reviewed: Acceptable for surgery.  Lab Results  Component Value Date   WBC 6.7 01/27/2020   HGB 13.2 01/27/2020   HCT 40.9 01/27/2020   MCV 94.5 01/27/2020   PLT 277 01/27/2020      Component Value Date/Time   NA 140 01/27/2020 1351   K 3.3 (L) 01/27/2020 1351   CL 100 01/27/2020 1351   CO2 28 01/27/2020 1351   GLUCOSE 108 (H) 01/27/2020 1351   BUN 14 01/27/2020 1351   CREATININE 0.58 01/27/2020 1351   CALCIUM 9.2 01/27/2020 1351   PROT 6.7 01/27/2020 1351   ALBUMIN 3.6 01/27/2020 1351   AST 16 01/27/2020 1351   ALT 16 01/27/2020 1351   ALKPHOS 85 01/27/2020 1351   BILITOT 0.4 01/27/2020 1351   GFRNONAA >60 01/27/2020 1351   GFRAA >60 09/18/2019 1031    ECG: Date: 01/27/2020 Time ECG obtained: 1403 PM Rate: 78 bpm Rhythm: Sinus rhythm with first-degree AV block; LVH with QRS widening Axis (leads I and aVF): Normal Intervals: PR 236 ms. QRS 116 ms. QTc 458 ms. ST segment and T wave changes: No evidence of acute ST segment elevation or depression Comparison: Similar to previous tracing obtained on 11/13/2019   IMAGING / PROCEDURES: DIAGNOSTIC RADIOGRAPHS CHEST performed on 02/11/2020 1. Minimal left basilar atelectasis 2. Anterior chest wall cardiac monitoring device 3. Anterior cervical fusion hardware noted  CT CERVICAL SPINE WITHOUT CONTRAST performed on 01/27/2020 1. No acute cervical spine fracture 2. Previous ACDF spanning C4-C7 with bony fusion across the disc spaces 3. Spondylosis at C2-C3 to a lesser extent C3-C4.   4. Symmetrical neuroforaminal encroachment at C2-C3 with right neuroforaminal encroachment at C3-C4 5. A 3.1 cm right lobe thyroid nodule is noted.  This has been evaluated on previous imaging.  CT CHEST, ABDOMEN, PELVIS WITH CONTRAST performed on 01/27/2020 1. No evidence  of significant pulmonary embolus 2. Emphysematous changes in the lung 3. Mild infiltration or atelectasis in the left costophrenic angle 4. Postoperative right thoracotomy 5. No acute process demonstrated in the abdomen and pelvis 6. Emphysema and aortic atherosclerosis 7. Left common carotid artery origin stenosis  PET SCAN INITIAL SKULL BASE TO THIGH performed on 10/02/2019 1. Hypermetabolic spiculated nodule in the right upper lobe is most consistent with bronchogenic carcinoma.  FDG PET staging T1a N0 M0. 2. Groundglass nodule right middle lobe.  Recommend attention on follow-up 3. Incidental finding of an enlarged right lobe of thyroid gland with hypermetabolic activity.  Recommend thyroid ultrasound to evaluate for thyroid carcinoma versus adenoma. 4. Incidental finding of intense metabolic activity at the level of the distal rectum/anus.  This is favored physiologic and may relate to stool however there  is minimal activity elsewhere in the bowel.  Recommend physical exam/digital rectal exam.  Impression and Plan:  Ann [name] has been referred for pre-anesthesia review and clearance prior to her undergoing the planned anesthetic and procedural courses. Available labs, pertinent testing, and imaging results were personally reviewed by me. This patient has been appropriately cleared by cardiology with an overall LOW risk of significant perioperative cardiovascular complications.  Additionally, patient's internal medicine provider Ouida Sills, MD) has cleared patient to proceed with a MODERATE to HIGH risk of perioperative complications.    Based on clinical review performed today (02/20/20), barring any significant acute changes in the patient's overall condition, it is anticipated that she will be able to proceed with the planned surgical intervention. Any acute changes in clinical condition may necessitate her procedure being postponed and/or cancelled. Pre-surgical instructions were  reviewed with the patient during her PAT appointment and questions were fielded by PAT clinical staff.  Honor Loh, MSN, APRN, FNP-C, CEN [facility]  Peri-operative Services Nurse Practitioner Phone: [phone] 02/20/20 6:29 PM  NOTE: This note has been prepared using Dragon dictation software. Despite my best ability to proofread, there is always the potential that unintentional transcriptional errors may still occur from this process.

## 2020-02-23 ENCOUNTER — Other Ambulatory Visit
Admission: RE | Admit: 2020-02-23 | Discharge: 2020-02-23 | Disposition: A | Discharge status: Home / Self Care | Payer: Medicare Other | Source: Ambulatory Visit | Attending: General Surgery | Admitting: General Surgery

## 2020-02-23 ENCOUNTER — Other Ambulatory Visit: Payer: Self-pay

## 2020-02-23 DIAGNOSIS — Z20822 Contact with and (suspected) exposure to covid-19: Secondary | ICD-10-CM | POA: Diagnosis not present

## 2020-02-23 DIAGNOSIS — Z01812 Encounter for preprocedural laboratory examination: Secondary | ICD-10-CM | POA: Diagnosis present

## 2020-02-24 LAB — SARS CORONAVIRUS 2 (TAT 6-24 HRS): SARS Coronavirus 2: NEGATIVE

## 2020-02-25 ENCOUNTER — Encounter
Admission: RE | Disposition: A | Discharge status: Home / Self Care | Payer: Self-pay | Source: Home / Self Care | Attending: General Surgery

## 2020-02-25 ENCOUNTER — Other Ambulatory Visit: Payer: Self-pay

## 2020-02-25 ENCOUNTER — Encounter: Payer: Self-pay | Admitting: General Surgery

## 2020-02-25 ENCOUNTER — Ambulatory Visit: Payer: Medicare Other | Admitting: Urgent Care

## 2020-02-25 ENCOUNTER — Observation Stay
Admission: RE | Admit: 2020-02-25 | Discharge: 2020-02-26 | Disposition: A | Discharge status: Home / Self Care | Payer: Medicare Other | Attending: General Surgery | Admitting: General Surgery

## 2020-02-25 DIAGNOSIS — Z7982 Long term (current) use of aspirin: Secondary | ICD-10-CM | POA: Diagnosis not present

## 2020-02-25 DIAGNOSIS — E041 Nontoxic single thyroid nodule: Secondary | ICD-10-CM

## 2020-02-25 DIAGNOSIS — I10 Essential (primary) hypertension: Secondary | ICD-10-CM | POA: Insufficient documentation

## 2020-02-25 DIAGNOSIS — E042 Nontoxic multinodular goiter: Principal | ICD-10-CM | POA: Insufficient documentation

## 2020-02-25 DIAGNOSIS — I2510 Atherosclerotic heart disease of native coronary artery without angina pectoris: Secondary | ICD-10-CM | POA: Diagnosis not present

## 2020-02-25 DIAGNOSIS — Z79899 Other long term (current) drug therapy: Secondary | ICD-10-CM | POA: Diagnosis not present

## 2020-02-25 DIAGNOSIS — I252 Old myocardial infarction: Secondary | ICD-10-CM | POA: Diagnosis not present

## 2020-02-25 DIAGNOSIS — Z7901 Long term (current) use of anticoagulants: Secondary | ICD-10-CM | POA: Insufficient documentation

## 2020-02-25 DIAGNOSIS — J449 Chronic obstructive pulmonary disease, unspecified: Secondary | ICD-10-CM | POA: Diagnosis not present

## 2020-02-25 DIAGNOSIS — C73 Malignant neoplasm of thyroid gland: Secondary | ICD-10-CM | POA: Diagnosis not present

## 2020-02-25 DIAGNOSIS — Z85118 Personal history of other malignant neoplasm of bronchus and lung: Secondary | ICD-10-CM | POA: Diagnosis not present

## 2020-02-25 DIAGNOSIS — E890 Postprocedural hypothyroidism: Secondary | ICD-10-CM

## 2020-02-25 DIAGNOSIS — Z9089 Acquired absence of other organs: Secondary | ICD-10-CM

## 2020-02-25 DIAGNOSIS — Z87891 Personal history of nicotine dependence: Secondary | ICD-10-CM | POA: Diagnosis not present

## 2020-02-25 HISTORY — DX: Malignant neoplasm of unspecified part of unspecified bronchus or lung: C34.90

## 2020-02-25 HISTORY — DX: Occlusion and stenosis of unspecified carotid artery: I65.29

## 2020-02-25 HISTORY — PX: THYROIDECTOMY: SHX17

## 2020-02-25 HISTORY — DX: Hyperlipidemia, unspecified: E78.5

## 2020-02-25 HISTORY — DX: Atherosclerosis of aorta: I70.0

## 2020-02-25 LAB — CALCIUM: Calcium: 8.5 mg/dL — ABNORMAL LOW (ref 8.9–10.3)

## 2020-02-25 LAB — POCT I-STAT, CHEM 8
BUN: 17 mg/dL (ref 8–23)
Calcium, Ion: 1.22 mmol/L (ref 1.15–1.40)
Chloride: 99 mmol/L (ref 98–111)
Creatinine, Ser: 0.7 mg/dL (ref 0.44–1.00)
Glucose, Bld: 129 mg/dL — ABNORMAL HIGH (ref 70–99)
HCT: 41 % (ref 36.0–46.0)
Hemoglobin: 13.9 g/dL (ref 12.0–15.0)
Potassium: 3 mmol/L — ABNORMAL LOW (ref 3.5–5.1)
Sodium: 142 mmol/L (ref 135–145)
TCO2: 28 mmol/L (ref 22–32)

## 2020-02-25 LAB — ALBUMIN: Albumin: 3.4 g/dL — ABNORMAL LOW (ref 3.5–5.0)

## 2020-02-25 SURGERY — THYROIDECTOMY
Anesthesia: General | Site: Neck

## 2020-02-25 MED ORDER — IBUPROFEN 600 MG PO TABS
600.0000 mg | ORAL_TABLET | Freq: Four times a day (QID) | ORAL | Status: DC | PRN
  Administered 2020-02-25: 600 mg via ORAL

## 2020-02-25 MED ORDER — METOPROLOL TARTRATE 25 MG PO TABS: 25.0000 mg | ORAL_TABLET | Freq: Two times a day (BID) | ORAL | Status: DC

## 2020-02-25 MED ORDER — FENTANYL CITRATE (PF) 100 MCG/2ML IJ SOLN
25.0000 ug | INTRAMUSCULAR | Status: DC | PRN
Start: 2020-02-25 — End: 2020-02-25
  Administered 2020-02-25 (×2): 25 ug via INTRAVENOUS

## 2020-02-25 MED ORDER — POTASSIUM CHLORIDE CRYS ER 20 MEQ PO TBCR: 10.0000 meq | EXTENDED_RELEASE_TABLET | Freq: Every day | ORAL | Status: DC

## 2020-02-25 MED ORDER — HEMOSTATIC AGENTS (NO CHARGE) OPTIME
TOPICAL | Status: DC | PRN
  Administered 2020-02-25: 1 via TOPICAL

## 2020-02-25 MED ORDER — "VISTASEAL 4 ML SINGLE DOSE KIT "
PACK | CUTANEOUS | Status: DC | PRN
  Administered 2020-02-25: 4 mL via TOPICAL

## 2020-02-25 MED ORDER — FENTANYL CITRATE (PF) 100 MCG/2ML IJ SOLN
INTRAMUSCULAR | Status: AC
  Administered 2020-02-25: 25 ug via INTRAVENOUS
  Filled 2020-02-25: qty 2

## 2020-02-25 MED ORDER — FENTANYL CITRATE (PF) 100 MCG/2ML IJ SOLN
INTRAMUSCULAR | Status: AC
  Filled 2020-02-25: qty 2

## 2020-02-25 MED ORDER — OXYCODONE HCL 5 MG/5ML PO SOLN: 5.0000 mg | Freq: Once | ORAL | Status: DC | PRN

## 2020-02-25 MED ORDER — ACETAMINOPHEN 500 MG PO TABS
1000.0000 mg | ORAL_TABLET | Freq: Four times a day (QID) | ORAL | Status: DC
  Administered 2020-02-26 (×2): 1000 mg via ORAL

## 2020-02-25 MED ORDER — ACETAMINOPHEN 500 MG PO TABS
ORAL_TABLET | ORAL | Status: AC
  Administered 2020-02-25: 1000 mg via ORAL
  Filled 2020-02-25: qty 2

## 2020-02-25 MED ORDER — VASOPRESSIN 20 UNIT/ML IV SOLN
INTRAVENOUS | Status: DC | PRN
  Administered 2020-02-25: 2 [IU] via INTRAVENOUS

## 2020-02-25 MED ORDER — REMIFENTANIL HCL 1 MG IV SOLR
INTRAVENOUS | Status: DC | PRN
  Administered 2020-02-25: .15 ug/kg/min via INTRAVENOUS

## 2020-02-25 MED ORDER — SODIUM CHLORIDE 0.9 % IV SOLN
INTRAVENOUS | Status: DC | PRN
  Administered 2020-02-25: 10 ug/min via INTRAVENOUS

## 2020-02-25 MED ORDER — ACETAMINOPHEN 500 MG PO TABS: 1000.0000 mg | ORAL_TABLET | ORAL | Status: AC

## 2020-02-25 MED ORDER — FLUTICASONE-UMECLIDIN-VILANT 100-62.5-25 MCG/INH IN AEPB: 1.0000 | INHALATION_SPRAY | Freq: Every day | RESPIRATORY_TRACT | Status: DC

## 2020-02-25 MED ORDER — TRAMADOL HCL 50 MG PO TABS
50.0000 mg | ORAL_TABLET | Freq: Four times a day (QID) | ORAL | Status: DC | PRN
  Administered 2020-02-25: 50 mg via ORAL

## 2020-02-25 MED ORDER — TRAMADOL HCL 50 MG PO TABS
ORAL_TABLET | ORAL | Status: AC
  Filled 2020-02-25: qty 1

## 2020-02-25 MED ORDER — ONDANSETRON 4 MG PO TBDP: 4.0000 mg | ORAL_TABLET | Freq: Four times a day (QID) | ORAL | Status: DC | PRN

## 2020-02-25 MED ORDER — DEXAMETHASONE SODIUM PHOSPHATE 10 MG/ML IJ SOLN
INTRAMUSCULAR | Status: DC | PRN
  Administered 2020-02-25: 10 mg via INTRAVENOUS

## 2020-02-25 MED ORDER — SIMETHICONE 80 MG PO CHEW
40.0000 mg | CHEWABLE_TABLET | Freq: Four times a day (QID) | ORAL | Status: DC | PRN
  Filled 2020-02-25: qty 1

## 2020-02-25 MED ORDER — GLYCOPYRROLATE 0.2 MG/ML IJ SOLN
INTRAMUSCULAR | Status: DC | PRN
  Administered 2020-02-25: .2 mg via INTRAVENOUS

## 2020-02-25 MED ORDER — DIPHENHYDRAMINE HCL 50 MG/ML IJ SOLN
INTRAMUSCULAR | Status: AC
  Administered 2020-02-25: 6.25 mg via INTRAVENOUS
  Filled 2020-02-25: qty 1

## 2020-02-25 MED ORDER — EPHEDRINE SULFATE 50 MG/ML IJ SOLN
INTRAMUSCULAR | Status: DC | PRN
  Administered 2020-02-25: 10 mg via INTRAVENOUS

## 2020-02-25 MED ORDER — PHENYLEPHRINE HCL (PRESSORS) 10 MG/ML IV SOLN
INTRAVENOUS | Status: DC | PRN
  Administered 2020-02-25: 200 ug via INTRAVENOUS

## 2020-02-25 MED ORDER — DEXTROSE IN LACTATED RINGERS 5 % IV SOLN: INTRAVENOUS | Status: DC

## 2020-02-25 MED ORDER — FENTANYL CITRATE (PF) 100 MCG/2ML IJ SOLN
INTRAMUSCULAR | Status: DC | PRN
  Administered 2020-02-25 (×2): 50 ug via INTRAVENOUS

## 2020-02-25 MED ORDER — OXYCODONE HCL 5 MG PO TABS
5.0000 mg | ORAL_TABLET | ORAL | Status: DC | PRN
  Administered 2020-02-25: 5 mg via ORAL

## 2020-02-25 MED ORDER — OXYCODONE HCL 5 MG PO TABS
5.0000 mg | ORAL_TABLET | Freq: Once | ORAL | Status: DC | PRN
Start: 2020-02-25 — End: 2020-02-25

## 2020-02-25 MED ORDER — OXYCODONE HCL 5 MG PO TABS
ORAL_TABLET | ORAL | Status: AC
  Filled 2020-02-25: qty 1

## 2020-02-25 MED ORDER — MIDAZOLAM HCL 2 MG/2ML IJ SOLN
INTRAMUSCULAR | Status: AC
  Filled 2020-02-25: qty 2

## 2020-02-25 MED ORDER — UMECLIDINIUM BROMIDE 62.5 MCG/INH IN AEPB
1.0000 | INHALATION_SPRAY | Freq: Every day | RESPIRATORY_TRACT | Status: DC
  Filled 2020-02-25: qty 7

## 2020-02-25 MED ORDER — PROMETHAZINE HCL 25 MG/ML IJ SOLN: 6.2500 mg | INTRAMUSCULAR | Status: DC | PRN

## 2020-02-25 MED ORDER — FLUTICASONE FUROATE-VILANTEROL 100-25 MCG/INH IN AEPB
1.0000 | INHALATION_SPRAY | Freq: Every day | RESPIRATORY_TRACT | Status: DC
  Filled 2020-02-25: qty 28

## 2020-02-25 MED ORDER — LACTATED RINGERS IV SOLN: INTRAVENOUS | Status: DC

## 2020-02-25 MED ORDER — CHLORHEXIDINE GLUCONATE 0.12 % MT SOLN: 15.0000 mL | Freq: Once | OROMUCOSAL | Status: DC

## 2020-02-25 MED ORDER — CHLORHEXIDINE GLUCONATE CLOTH 2 % EX PADS: 6.0000 | MEDICATED_PAD | Freq: Once | CUTANEOUS | Status: DC

## 2020-02-25 MED ORDER — PANTOPRAZOLE SODIUM 40 MG PO TBEC
40.0000 mg | DELAYED_RELEASE_TABLET | Freq: Every day | ORAL | Status: DC
Start: 2020-02-26 — End: 2020-02-26
  Administered 2020-02-26: 40 mg via ORAL
  Filled 2020-02-25 (×2): qty 1

## 2020-02-25 MED ORDER — ONDANSETRON HCL 4 MG/2ML IJ SOLN
INTRAMUSCULAR | Status: DC | PRN
  Administered 2020-02-25 (×2): 4 mg via INTRAVENOUS

## 2020-02-25 MED ORDER — LIDOCAINE HCL (CARDIAC) PF 100 MG/5ML IV SOSY
PREFILLED_SYRINGE | INTRAVENOUS | Status: DC | PRN
  Administered 2020-02-25: 60 mg via INTRAVENOUS

## 2020-02-25 MED ORDER — PROPOFOL 10 MG/ML IV BOLUS
INTRAVENOUS | Status: AC
  Filled 2020-02-25: qty 20

## 2020-02-25 MED ORDER — ASPIRIN 325 MG PO TABS
325.0000 mg | ORAL_TABLET | Freq: Every day | ORAL | Status: DC
Start: 2020-02-25 — End: 2020-02-26
  Administered 2020-02-25 – 2020-02-26 (×2): 325 mg via ORAL
  Filled 2020-02-25 (×3): qty 1

## 2020-02-25 MED ORDER — MENTHOL 3 MG MT LOZG
1.0000 | LOZENGE | OROMUCOSAL | Status: DC | PRN
  Administered 2020-02-26: 3 mg via ORAL
  Filled 2020-02-25 (×2): qty 9

## 2020-02-25 MED ORDER — MIDAZOLAM HCL 2 MG/2ML IJ SOLN
INTRAMUSCULAR | Status: DC | PRN
  Administered 2020-02-25: 2 mg via INTRAVENOUS

## 2020-02-25 MED ORDER — OXYCODONE HCL 5 MG PO TABS
ORAL_TABLET | ORAL | Status: AC
  Administered 2020-02-25: 5 mg via ORAL
  Filled 2020-02-25: qty 1

## 2020-02-25 MED ORDER — PROPOFOL 10 MG/ML IV BOLUS
INTRAVENOUS | Status: DC | PRN
  Administered 2020-02-25: 100 mg via INTRAVENOUS

## 2020-02-25 MED ORDER — IBUPROFEN 600 MG PO TABS
ORAL_TABLET | ORAL | Status: AC
  Filled 2020-02-25: qty 1

## 2020-02-25 MED ORDER — METOPROLOL TARTRATE 25 MG PO TABS
ORAL_TABLET | ORAL | Status: AC
  Administered 2020-02-25: 25 mg via ORAL
  Filled 2020-02-25: qty 1

## 2020-02-25 MED ORDER — CALCIUM CARBONATE 1250 (500 CA) MG PO TABS
500.0000 mg | ORAL_TABLET | Freq: Three times a day (TID) | ORAL | Status: DC
  Administered 2020-02-25 – 2020-02-26 (×3): 500 mg via ORAL
  Filled 2020-02-25 (×5): qty 1

## 2020-02-25 MED ORDER — SUCCINYLCHOLINE CHLORIDE 20 MG/ML IJ SOLN
INTRAMUSCULAR | Status: DC | PRN
  Administered 2020-02-25: 80 mg via INTRAVENOUS

## 2020-02-25 MED ORDER — MEPERIDINE HCL 50 MG/ML IJ SOLN: 6.2500 mg | INTRAMUSCULAR | Status: DC | PRN

## 2020-02-25 MED ORDER — ORAL CARE MOUTH RINSE: 15.0000 mL | Freq: Once | OROMUCOSAL | Status: DC

## 2020-02-25 MED ORDER — LEVOTHYROXINE SODIUM 88 MCG PO TABS
88.0000 ug | ORAL_TABLET | Freq: Every day | ORAL | Status: DC
  Administered 2020-02-26: 88 ug via ORAL
  Filled 2020-02-25: qty 1

## 2020-02-25 MED ORDER — DIPHENHYDRAMINE HCL 50 MG/ML IJ SOLN: 6.2500 mg | Freq: Once | INTRAMUSCULAR | Status: AC

## 2020-02-25 MED ORDER — ONDANSETRON HCL 4 MG/2ML IJ SOLN: 4.0000 mg | Freq: Four times a day (QID) | INTRAMUSCULAR | Status: DC | PRN

## 2020-02-25 MED ORDER — REMIFENTANIL HCL 1 MG IV SOLR
INTRAVENOUS | Status: AC
  Filled 2020-02-25: qty 2000

## 2020-02-25 SURGICAL SUPPLY — 44 items
BACTOSHIELD CHG 4% 4OZ (MISCELLANEOUS) ×1
BASIN GRAD PLASTIC 32OZ STRL (MISCELLANEOUS) ×2 IMPLANT
BLADE SURG 15 STRL LF DISP TIS (BLADE) ×1 IMPLANT
BLADE SURG 15 STRL SS (BLADE) ×1
CANISTER SUCT 1200ML W/VALVE (MISCELLANEOUS) IMPLANT
CLIP VESOCCLUDE SM WIDE 6/CT (CLIP) ×2 IMPLANT
COVER WAND RF STERILE (DRAPES) ×2 IMPLANT
DERMABOND ADVANCED (GAUZE/BANDAGES/DRESSINGS) ×1
DERMABOND ADVANCED .7 DNX12 (GAUZE/BANDAGES/DRESSINGS) ×1 IMPLANT
DRAPE LAPAROTOMY 77X122 PED (DRAPES) ×2 IMPLANT
DRAPE MAG INST 16X20 L/F (DRAPES) ×2 IMPLANT
DRSG TEGADERM 2-3/8X2-3/4 SM (GAUZE/BANDAGES/DRESSINGS) ×2 IMPLANT
ELECT CAUTERY BLADE TIP 2.5 (TIP) ×2
ELECT LARYNGEAL DUAL CHAN (ELECTRODE) ×2 IMPLANT
ELECT NEEDLE 20X.3 GREEN (MISCELLANEOUS) ×2
ELECT REM PT RETURN 9FT ADLT (ELECTROSURGICAL) ×2
ELECTRODE CAUTERY BLDE TIP 2.5 (TIP) ×1 IMPLANT
ELECTRODE NEEDLE 20X.3 GREEN (MISCELLANEOUS) ×1 IMPLANT
ELECTRODE REM PT RTRN 9FT ADLT (ELECTROSURGICAL) ×1 IMPLANT
GAUZE 4X4 16PLY RFD (DISPOSABLE) ×4 IMPLANT
GLOVE SURG ENC MOIS LTX SZ6.5 (GLOVE) ×4 IMPLANT
GLOVE SURG UNDER LTX SZ7 (GLOVE) ×2 IMPLANT
GOWN STRL REUS W/ TWL LRG LVL3 (GOWN DISPOSABLE) ×2 IMPLANT
GOWN STRL REUS W/TWL LRG LVL3 (GOWN DISPOSABLE) ×2
HEMOSTAT SNOW SURGICEL 2X4 (HEMOSTASIS) ×2 IMPLANT
KIT TURNOVER KIT A (KITS) ×2 IMPLANT
LABEL OR SOLS (LABEL) ×2 IMPLANT
MANIFOLD NEPTUNE II (INSTRUMENTS) ×2 IMPLANT
NS IRRIG 500ML POUR BTL (IV SOLUTION) ×2 IMPLANT
PACK BASIN MINOR ARMC (MISCELLANEOUS) ×2 IMPLANT
PROBE NEUROSIGN BIPOL (MISCELLANEOUS) ×1 IMPLANT
PROBE NEUROSIGN BIPOLAR (MISCELLANEOUS) ×1
SCRUB CHG 4% DYNA-HEX 4OZ (MISCELLANEOUS) ×1 IMPLANT
SET WALTER ACTIVATION W/DRAPE (SET/KITS/TRAYS/PACK) ×2 IMPLANT
SHEARS HARMONIC 9CM CVD (BLADE) ×2 IMPLANT
SPONGE KITTNER 5P (MISCELLANEOUS) ×2 IMPLANT
STRIP CLOSURE SKIN 1/2X4 (GAUZE/BANDAGES/DRESSINGS) ×2 IMPLANT
SUT MNCRL AB 4-0 PS2 18 (SUTURE) IMPLANT
SUT PROLENE 4 0 PS 2 18 (SUTURE) ×2 IMPLANT
SUT SILK 2 0 (SUTURE) ×1
SUT SILK 2-0 18XBRD TIE 12 (SUTURE) ×1 IMPLANT
SUT VIC AB 4-0 RB1 27 (SUTURE) ×1
SUT VIC AB 4-0 RB1 27X BRD (SUTURE) ×1 IMPLANT
SYR BULB IRRIG 60ML STRL (SYRINGE) ×2 IMPLANT

## 2020-02-25 NOTE — Transfer of Care (Signed)
Immediate Anesthesia Transfer of Care Note  Patient: Ann [name]  Procedure(s) Performed: THYROIDECTOMY, total (N/A Neck)  Patient Location: PACU  Anesthesia Type:General  Level of Consciousness: awake, drowsy and patient cooperative  Airway & Oxygen Therapy: Patient Spontanous Breathing and Patient connected to face mask oxygen  Post-op Assessment: Report given to RN and Post -op Vital signs reviewed and stable  Post vital signs: Reviewed and stable  Last Vitals:  Vitals Value Taken Time  BP 143/82 02/25/20 1007  Temp    Pulse 85 02/25/20 1012  Resp    SpO2 100 % 02/25/20 1012  Vitals shown include unvalidated device data.  Last Pain:  Vitals:   02/25/20 0613  TempSrc: Temporal  PainSc: 0-No pain         Complications: No complications documented.

## 2020-02-25 NOTE — Op Note (Signed)
Operative Note  Preoperative Diagnosis:  a suspicious thyroid biopsy  Postoperative Diagnosis:  a suspicious thyroid biopsy  Operation:  Total Thyroidectomy  Surgeon: [name], MD  Assistant: Ronny Bacon, MD (a second surgeon was necessary due to the complexity of the case and need for adequate exposure)  Anesthesia: General endotracheal with nerve monitoring system.  Findings: Dominant right-sided thyroid nodule, both recurrent laryngeal nerves were well visualized and gave excellent functional signals throughout the case.  We did not clearly identify the right inferior parathyroid gland, but the remaining 3 were visualized and preserved in situ.  Indications: This is a 65 year old woman who underwent a PET scan to further evaluate a lung mass.  She had a PET avid thyroid nodule that was subsequently biopsied and was suspicious by Afirma criteria.  She was referred for surgical evaluation.  The risks of total thyroidectomy versus partial thyroidectomy were discussed with the patient and she elected the former procedure.  Procedure In Detail: The patient was identified in the preoperative holding area and brought to the operating room where she was placed supine on the OR table.  All bony prominences were padded and bilateral sequential compression devices were placed on the lower extremities.  General endotracheal anesthesia was induced using the nerve monitoring system.  Tube placement was verified with the McGrath laryngoscope.  The grounding lead was placed.  The patient was positioned appropriately for the operation and sterilely prepped and draped in standard fashion.  A timeout was performed confirming the patient's identity, the procedure being performed, her allergies, all necessary equipment was available, and that maintenance anesthesia was adequate.  A 5 cm transverse incision was made in a natural skin fold that was appropriately positioned for the operation.  This was  carried down through the subcutaneous tissues and platysma using electrocautery.  Subplatysmal flaps were elevated.  The strap muscles were divided in the median raphae.  We elevated the strap muscles off of the right lobe of the thyroid and retracted them laterally.  The superior pole vessels were sequentially isolated and divided with silk ties and the harmonic scalpel.  The superior parathyroid gland was identified and preserved on a good pedicle.  We rotated the thyroid medially.  This was somewhat challenging due to what appeared to be some scarring or adhesions-like tissue in the area.  The middle thyroid vein was divided.  We dissected into the lateral fibrofatty tissues of the central neck and identified the recurrent laryngeal nerve.  It gave an excellent functional signal when stimulated.  The trachea was exposed medial to the nerve and the inferior pole vessels were divided.  We did not clearly appreciate the inferior parathyroid gland.  The nerve was dissected up to its insertion point at the cricopharyngeal muscle.  The ligament of Berry and anterior suspensory ligament were divided.  The gland was dissected off of the trachea and across the midline.  There was a small pyramidal lobe that was dissected away from the underlying musculature.  It was divided at its most superior aspect.  We turned to the left where we proceeded similarly.  The strap muscles were elevated off of the thyroid tissue and retracted laterally.  The superior pole vessels were isolated and sequentially divided with silk ties and the harmonic scalpel.  The superior parathyroid gland was identified and preserved in situ.  The middle thyroid vein was divided.  The thyroid was rotated medially.  We opened the tracheoesophageal groove and identified the recurrent laryngeal nerve.  It  gave an excellent functional signal when stimulated.  The trachea was exposed medial to the nerve and the inferior pole vessels were divided.  The  inferior parathyroid gland was dissected off of the thyroid tissue and preserved on a good pedicle.  The nerve was dissected up to its insertion point at the cricopharyngeal muscle.  The remaining attachments of the thyroid to the trachea were divided and the gland was completely excised.  It was inspected for parathyroid tissue and none was seen.  It was handed off as a specimen.  We irrigated the wound bed and obtain good hemostasis.  Valsalva maneuvers from the anesthesia team confirmed no ongoing surgical bleeding.  We applied SNoW and Vistaseal for additional hemostasis.  The strap muscles were closed in the midline with running 4-0 Vicryl.  The platysma was closed with interrupted Vicryl.  The skin was closed with running subcuticular Prolene.  The skin was cleaned.  Dermabond and Steri-Strips were applied.  The Prolene suture was removed.  The patient was awakened, extubated, and taken to the postanesthesia care unit in good condition.  EBL: 5 cc  IVF: See anesthesia record  Specimen(s): Total thyroid to pathology for permanent section  Complications: none immediately apparent.   Counts: all needles, instruments, and sponges were counted and reported to be correct in number at the end of the case.   I was present for and participated in the entire operation.  [name] 10:10 AM

## 2020-02-25 NOTE — Interval H&P Note (Signed)
History and Physical Interval Note:  02/25/2020 7:15 AM  Ann [name]  has presented today for surgery, with the diagnosis of multinodular goiter biopsy suspicious.  The various methods of treatment have been discussed with the patient and family. After consideration of risks, benefits and other options for treatment, the patient has consented to  Procedure(s) with comments: THYROIDECTOMY, total (N/A) - Provider requesting 2.5 hours/150 minutes for procedure. as a surgical intervention.  The patient's history has been reviewed, patient examined, no change in status, stable for surgery.  I have reviewed the patient's chart and labs.  Questions were answered to the patient's satisfaction.     [name]

## 2020-02-25 NOTE — Anesthesia Preprocedure Evaluation (Signed)
Anesthesia Evaluation  Patient identified by MRN, date of birth, ID band Patient awake    Reviewed: Allergy & Precautions, NPO status , Patient's Chart, lab work & pertinent test results  History of Anesthesia Complications Negative for: history of anesthetic complications  Airway Mallampati: III  TM Distance: >3 FB Neck ROM: Full    Dental  (+) Poor Dentition   Pulmonary neg sleep apnea, COPD,  COPD inhaler, former smoker,  Lung mass   breath sounds clear to auscultation- rhonchi (-) wheezing      Cardiovascular hypertension, Pt. on medications + CAD, + Past MI and + Cardiac Stents (2005)  (-) CABG  Rhythm:Regular Rate:Normal - Systolic murmurs and - Diastolic murmurs    Neuro/Psych neg Seizures CVA, No Residual Symptoms negative psych ROS   GI/Hepatic negative GI ROS, Neg liver ROS,   Endo/Other  negative endocrine ROSneg diabetes  Renal/GU negative Renal ROS     Musculoskeletal negative musculoskeletal ROS (+)   Abdominal (+) - obese,   Peds  Hematology negative hematology ROS (+)   Anesthesia Other Findings Past Medical History: No date: COPD (chronic obstructive pulmonary disease) (HCC) No date: Coronary artery disease     Comment:  Prior stenting Hypoxic: Hypertension No date: Hypokalemia No date: Multiple thyroid nodules No date: Myocardial infarction ([place]) 2005: Stroke ([place])     Comment:  TIA No date: Syncope   Reproductive/Obstetrics                             Anesthesia Physical  Anesthesia Plan  ASA: III  Anesthesia Plan: General   Post-op Pain Management:    Induction: Intravenous  PONV Risk Score and Plan: 2 and Ondansetron and Dexamethasone  Airway Management Planned: Oral ETT and Video Laryngoscope Planned  Additional Equipment:   Intra-op Plan:   Post-operative Plan: Extubation in OR  Informed Consent: I have reviewed the patients History and  Physical, chart, labs and discussed the procedure including the risks, benefits and alternatives for the proposed anesthesia with the patient or authorized representative who has indicated his/her understanding and acceptance.     Dental advisory given  Plan Discussed with: CRNA and Anesthesiologist  Anesthesia Plan Comments:         Anesthesia Quick Evaluation

## 2020-02-25 NOTE — Anesthesia Postprocedure Evaluation (Signed)
Anesthesia Post Note  Patient: Ann [name]  Procedure(s) Performed: THYROIDECTOMY, total (N/A Neck)  Patient location during evaluation: PACU Anesthesia Type: General Level of consciousness: awake and alert and oriented Pain management: pain level controlled Vital Signs Assessment: post-procedure vital signs reviewed and stable Respiratory status: spontaneous breathing, nonlabored ventilation and respiratory function stable Cardiovascular status: blood pressure returned to baseline and stable Postop Assessment: no signs of nausea or vomiting Anesthetic complications: no   No complications documented.   Last Vitals:  Vitals:   02/25/20 1115 02/25/20 1130  BP: (!) 153/83 (!) 147/79  Pulse: 90 89  Resp: (!) 21 20  Temp:    SpO2: 92% 91%    Last Pain:  Vitals:   02/25/20 1130  TempSrc:   PainSc: 5                  Bryli Mantey

## 2020-02-25 NOTE — Anesthesia Procedure Notes (Signed)
Procedure Name: Intubation Performed by: Kelton Pillar, CRNA Pre-anesthesia Checklist: Patient identified, Emergency Drugs available, Suction available and Patient being monitored Patient Re-evaluated:Patient Re-evaluated prior to induction Oxygen Delivery Method: Circle system utilized Preoxygenation: Pre-oxygenation with 100% oxygen Induction Type: IV induction Ventilation: Mask ventilation without difficulty Laryngoscope Size: McGraph and 3 Grade View: Grade III Tube type: Oral Number of attempts: 1 Airway Equipment and Method: Stylet,  Oral airway and Bougie stylet Placement Confirmation: ETT inserted through vocal cords under direct vision,  positive ETCO2,  breath sounds checked- equal and bilateral and CO2 detector Secured at: 19 cm Tube secured with: Tape Dental Injury: Teeth and Oropharynx as per pre-operative assessment  Difficulty Due To: Difficulty was unanticipated, Difficult Airway- due to dentition, Difficult Airway- due to limited oral opening and Difficult Airway- due to anterior larynx Future Recommendations: Recommend- induction with short-acting agent, and alternative techniques readily available

## 2020-02-26 DIAGNOSIS — E042 Nontoxic multinodular goiter: Secondary | ICD-10-CM | POA: Diagnosis not present

## 2020-02-26 LAB — CALCIUM: Calcium: 8 mg/dL — ABNORMAL LOW (ref 8.9–10.3)

## 2020-02-26 LAB — ALBUMIN: Albumin: 3.1 g/dL — ABNORMAL LOW (ref 3.5–5.0)

## 2020-02-26 MED ORDER — ACETAMINOPHEN 500 MG PO TABS
ORAL_TABLET | ORAL | Status: AC
  Filled 2020-02-26: qty 2

## 2020-02-26 MED ORDER — POTASSIUM CHLORIDE CRYS ER 20 MEQ PO TBCR
EXTENDED_RELEASE_TABLET | ORAL | Status: AC
  Administered 2020-02-26: 10 meq via ORAL
  Filled 2020-02-26: qty 1

## 2020-02-26 MED ORDER — LEVOTHYROXINE SODIUM 88 MCG PO TABS: 88.0000 ug | ORAL_TABLET | Freq: Every day | ORAL | 3 refills | Status: DC

## 2020-02-26 MED ORDER — IBUPROFEN 600 MG PO TABS: 600.0000 mg | ORAL_TABLET | Freq: Four times a day (QID) | ORAL | 0 refills | Status: DC | PRN

## 2020-02-26 MED ORDER — METOPROLOL TARTRATE 25 MG PO TABS
ORAL_TABLET | ORAL | Status: AC
  Administered 2020-02-26: 25 mg via ORAL
  Filled 2020-02-26: qty 1

## 2020-02-26 MED ORDER — TRAMADOL HCL 50 MG PO TABS: 50.0000 mg | ORAL_TABLET | Freq: Four times a day (QID) | ORAL | 0 refills | Status: DC | PRN

## 2020-02-26 MED ORDER — ASPIRIN EC 325 MG PO TBEC
DELAYED_RELEASE_TABLET | ORAL | Status: AC
  Filled 2020-02-26: qty 1

## 2020-02-26 MED ORDER — CALCIUM CARBONATE 1250 (500 CA) MG PO TABS: 2.0000 | ORAL_TABLET | Freq: Three times a day (TID) | ORAL | 1 refills | Status: DC

## 2020-02-26 NOTE — Discharge Summary (Addendum)
[facility] SURGICAL DISCHARGE SUMMARY  Patient ID: ARNITRA SOKOLOSKI MRN: [id number] DOB/AGE: [birth year] 66 y.o.  Admit date: 02/25/2020 Discharge date: 02/26/2020  Discharge Diagnoses Patient Active Problem List   Diagnosis Date Noted   S/P total thyroidectomy 02/25/2020   Thyroid nodule     Consultants None  Procedures 02/25/2020:  Total Thyroidectomy   HPI: JOZI MALACHI is a 65 year old woman who underwent a PET scan to further evaluate a lung mass.  She had a PET avid thyroid nodule that was subsequently biopsied and was suspicious by Afirma criteria.  She was referred for surgical evaluation.  The risks of total thyroidectomy versus partial thyroidectomy were discussed with the patient and she elected the former procedure.  Hospital Course: Informed consent was obtained and documented, and patient underwent uneventful total thyroidectomy (Dr Celine Ahr, 02/25/2020).  Post-operatively, patient had some issues with left sided neck pain but this resolved. Her corrected calcium levels remained normal and she was asymptomatic. Advancement of patient's diet and ambulation were well-tolerated. The remainder of patient's hospital course was essentially unremarkable, and discharge planning was initiated accordingly with patient safely able to be discharged home with appropriate discharge instructions, pain control, and outpatient follow-up after all of her questions were answered to her expressed satisfaction.   Discharge Condition: Good   Physical Examination:  Constitutional: Well appearing female, NAD Pulmonary: Normal effort, no respiratory distress, good phonation, no hoarseness Skin: Incision to anterior neck is CDI with steri-strips, no erythema or drainage    Allergies as of 02/26/2020       Reactions   Clopidogrel Anaphylaxis, Hives        Medication List     TAKE these medications    aspirin 325 MG tablet Take 325 mg by mouth daily.   calcium  carbonate 1250 (500 Ca) MG tablet Commonly known as: OS-CAL - dosed in mg of elemental calcium Take 2 tablets (1,000 mg of elemental calcium total) by mouth 3 (three) times daily with meals. Week 1: Take 2 tablets (1000 mg) three times a day Week 2: take 1 tablets (500 mg) three times a day until follow up appointment   furosemide 20 MG tablet Commonly known as: LASIX Take 20 mg by mouth daily as needed for edema.   ibuprofen 600 MG tablet Commonly known as: ADVIL Take 1 tablet (600 mg total) by mouth every 6 (six) hours as needed (for mild pain not relieved by other medications.).   levothyroxine 88 MCG tablet Commonly known as: SYNTHROID Take 1 tablet (88 mcg total) by mouth daily at 6 (six) AM. Start taking on: February 27, 2020   metoprolol tartrate 25 MG tablet Commonly known as: LOPRESSOR Take 25 mg by mouth 2 (two) times daily.   pantoprazole 40 MG tablet Commonly known as: PROTONIX Take 40 mg by mouth daily.   potassium chloride 10 MEQ tablet Commonly known as: KLOR-CON Take 10 mEq by mouth daily.   rosuvastatin 20 MG tablet Commonly known as: CRESTOR Take 20 mg by mouth daily.   traMADol 50 MG tablet Commonly known as: ULTRAM Take 1 tablet (50 mg total) by mouth every 6 (six) hours as needed for moderate pain or severe pain (mild pain).   Trelegy Ellipta 100-62.5-25 MCG/INH Aepb Generic drug: Fluticasone-Umeclidin-Vilant Inhale 1 puff into the lungs daily.          Follow-up Information     [name], MD. Schedule an appointment as soon as possible for a visit in 2 week(s).  Specialty: General Surgery Why: s/p thyroidectomy Contact information: [address] [place] [address] [phone]                  Time spent on discharge management including discussion of hospital course, clinical condition, outpatient instructions, prescriptions, and follow up with the patient and members of the medical team: >30  minutes  -- Edison Simon , PA-C [facility]  02/26/2020, 9:25 AM [phone] M-F: 7am - 4pm  I saw and evaluated the patient.  I agree with the above documentation, exam, and plan, which I have edited where appropriate. [name]  9:37 AM

## 2020-02-26 NOTE — Progress Notes (Signed)
Pt was discharged home per MD's order. Pt and pt's brother verbalized fully understanding of discharge instructions. Iv discontinued without difficulties. Pt was escorted via wheelchair to the visitor's entrance. Continue to monitor.

## 2020-02-26 NOTE — Discharge Instructions (Signed)

## 2020-02-26 NOTE — Plan of Care (Signed)
  Problem: Education: Goal: Knowledge of the prescribed therapeutic regimen will improve Outcome: Progressing   Problem: Clinical Measurements: Goal: Complications related to the disease process, condition or treatment will be avoided or minimized Outcome: Progressing   Problem: Respiratory: Goal: Ability to maintain a clear airway will improve Outcome: Progressing

## 2020-03-03 LAB — SURGICAL PATHOLOGY

## 2020-03-04 ENCOUNTER — Telehealth: Payer: Self-pay | Admitting: General Surgery

## 2020-03-04 NOTE — Telephone Encounter (Signed)
I discussed the pathology results from her thyroidectomy.  She has 2 microcarcinomas, both completely excised.  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  There was multinodular hyperplasia with Hurthle cell change and colloid nodules, a single node was removed and negative for malignancy.  Based upon this pathology, she does not need any additional intervention, such as radioactive iodine ablation, and her goal TSH is simply within the normal range.  She is scheduled for follow-up with our PA next week.

## 2020-03-11 ENCOUNTER — Encounter: Payer: Self-pay | Admitting: Physician Assistant

## 2020-03-11 ENCOUNTER — Other Ambulatory Visit: Payer: Self-pay

## 2020-03-11 ENCOUNTER — Ambulatory Visit (INDEPENDENT_AMBULATORY_CARE_PROVIDER_SITE_OTHER): Payer: Medicare Other | Admitting: Physician Assistant

## 2020-03-11 VITALS — BP 107/75 | HR 80 | Temp 98.3°F | Ht 62.0 in | Wt 135.2 lb

## 2020-03-11 DIAGNOSIS — E890 Postprocedural hypothyroidism: Secondary | ICD-10-CM

## 2020-03-11 DIAGNOSIS — E041 Nontoxic single thyroid nodule: Secondary | ICD-10-CM

## 2020-03-11 DIAGNOSIS — Z09 Encounter for follow-up examination after completed treatment for conditions other than malignant neoplasm: Secondary | ICD-10-CM

## 2020-03-11 NOTE — Patient Instructions (Addendum)
You can stop taking your calcium carbonate and begin taking tums. Have thyroid checked again in 3 months. This can be done at your primary care physicians office. Please contact us if you have any questions or concerns.

## 2020-03-11 NOTE — Progress Notes (Signed)
[facility] POST-OP OFFICE VISIT  03/11/2020  HPI: Ann [name] is a 65 y.o. female 15 days s/p total thyroidectomy for a suspicious thyroid biopsy which ultimately proved to 2 microcarcinomas, both completely excised, with Dr Celine Ahr.   She is doing well No issues with pain, fever, chills, trouble swallowing, trouble breathing, changes in her voice, or peripheral paraesthesias She continues to take her Synthroid as prescribed She is anxious to come off her Os-Cal  Vital signs: BP 107/75   Pulse 80   Temp 98.3 F (36.8 C) (Oral)   Ht 5\' 2"  (1.575 m)   Wt 135 lb 3.2 oz (61.3 kg)   SpO2 97%   BMI 24.73 kg/m    Physical Exam: Constitutional: Well appearing female, NAD Skin: Anterior neck incision is well healed, no erythema or drainage  Assessment/Plan: This is a 65 y.o. female 15 days s/p total thyroidectomy   - Pain control prn  - She can stop Os-Cal; re-educated on signs and symptoms of hypocalcemia  - Continue synthroid daily; follow up with PCP in 3 months for re-check and titration   - Again reviewed pathology as Dr Celine Ahr did via telephone on 03/03  - She can follow up in general surgery clinic as needed   -- Edison Simon, PA-C [facility] 03/11/2020, 1:34 PM [phone] M-F: 7am - 4pm

## 2020-03-15 ENCOUNTER — Ambulatory Visit (INDEPENDENT_AMBULATORY_CARE_PROVIDER_SITE_OTHER): Payer: Medicare Other

## 2020-03-15 DIAGNOSIS — I639 Cerebral infarction, unspecified: Secondary | ICD-10-CM

## 2020-03-22 LAB — CUP PACEART REMOTE DEVICE CHECK
Date Time Interrogation Session: 20220319081920
Implantable Pulse Generator Implant Date: 20210928

## 2020-03-22 NOTE — Progress Notes (Signed)
Carelink Summary Report / Loop Recorder 

## 2020-04-22 ENCOUNTER — Ambulatory Visit (INDEPENDENT_AMBULATORY_CARE_PROVIDER_SITE_OTHER): Payer: Medicare Other

## 2020-04-22 DIAGNOSIS — I639 Cerebral infarction, unspecified: Secondary | ICD-10-CM | POA: Diagnosis not present

## 2020-04-22 LAB — CUP PACEART REMOTE DEVICE CHECK
Date Time Interrogation Session: 20220421081718
Implantable Pulse Generator Implant Date: 20210928

## 2020-04-29 ENCOUNTER — Telehealth: Payer: Self-pay | Admitting: Oncology

## 2020-04-29 NOTE — Telephone Encounter (Signed)
Spoke with patient about rescheduling appt on 5/19. Patient agreeable to coming in on 5/20. Sending AVS in the mail per pt request.

## 2020-05-05 ENCOUNTER — Ambulatory Visit: Payer: Medicare Other

## 2020-05-10 NOTE — Progress Notes (Signed)
Carelink Summary Report / Loop Recorder 

## 2020-05-20 ENCOUNTER — Other Ambulatory Visit: Payer: Self-pay

## 2020-05-20 ENCOUNTER — Ambulatory Visit
Admission: RE | Admit: 2020-05-20 | Discharge: 2020-05-20 | Disposition: A | Discharge status: Home / Self Care | Payer: Medicare Other | Source: Ambulatory Visit | Attending: Oncology | Admitting: Oncology

## 2020-05-20 ENCOUNTER — Ambulatory Visit: Payer: Medicare Other | Admitting: Oncology

## 2020-05-20 ENCOUNTER — Other Ambulatory Visit: Payer: Medicare Other

## 2020-05-20 DIAGNOSIS — Z122 Encounter for screening for malignant neoplasm of respiratory organs: Secondary | ICD-10-CM | POA: Insufficient documentation

## 2020-05-20 DIAGNOSIS — R911 Solitary pulmonary nodule: Secondary | ICD-10-CM

## 2020-05-20 DIAGNOSIS — C3411 Malignant neoplasm of upper lobe, right bronchus or lung: Secondary | ICD-10-CM | POA: Diagnosis present

## 2020-05-20 HISTORY — DX: Solitary pulmonary nodule: R91.1

## 2020-05-21 ENCOUNTER — Ambulatory Visit: Payer: Medicare Other | Admitting: Oncology

## 2020-05-21 ENCOUNTER — Other Ambulatory Visit: Payer: Medicare Other

## 2020-05-24 ENCOUNTER — Ambulatory Visit (INDEPENDENT_AMBULATORY_CARE_PROVIDER_SITE_OTHER): Payer: Medicare Other

## 2020-05-24 DIAGNOSIS — I639 Cerebral infarction, unspecified: Secondary | ICD-10-CM

## 2020-05-25 ENCOUNTER — Inpatient Hospital Stay (HOSPITAL_BASED_OUTPATIENT_CLINIC_OR_DEPARTMENT_OTHER): Payer: Medicare Other | Admitting: Oncology

## 2020-05-25 ENCOUNTER — Inpatient Hospital Stay: Payer: Medicare Other | Attending: Oncology

## 2020-05-25 VITALS — BP 120/76 | HR 88 | Resp 20 | Wt 139.8 lb

## 2020-05-25 DIAGNOSIS — Z08 Encounter for follow-up examination after completed treatment for malignant neoplasm: Secondary | ICD-10-CM | POA: Diagnosis not present

## 2020-05-25 DIAGNOSIS — Z122 Encounter for screening for malignant neoplasm of respiratory organs: Secondary | ICD-10-CM

## 2020-05-25 DIAGNOSIS — Z85118 Personal history of other malignant neoplasm of bronchus and lung: Secondary | ICD-10-CM | POA: Diagnosis not present

## 2020-05-25 DIAGNOSIS — C3411 Malignant neoplasm of upper lobe, right bronchus or lung: Secondary | ICD-10-CM

## 2020-05-25 LAB — COMPREHENSIVE METABOLIC PANEL
ALT: 23 U/L (ref 0–44)
AST: 20 U/L (ref 15–41)
Albumin: 3.9 g/dL (ref 3.5–5.0)
Alkaline Phosphatase: 116 U/L (ref 38–126)
Anion gap: 12 (ref 5–15)
BUN: 12 mg/dL (ref 8–23)
CO2: 27 mmol/L (ref 22–32)
Calcium: 8.8 mg/dL — ABNORMAL LOW (ref 8.9–10.3)
Chloride: 102 mmol/L (ref 98–111)
Creatinine, Ser: 0.84 mg/dL (ref 0.44–1.00)
GFR, Estimated: >60 mL/min (ref >60)
Glucose, Bld: 125 mg/dL — ABNORMAL HIGH (ref 70–99)
Potassium: 3.4 mmol/L — ABNORMAL LOW (ref 3.5–5.1)
Sodium: 141 mmol/L (ref 135–145)
Total Bilirubin: 0.5 mg/dL (ref 0.3–1.2)
Total Protein: 7 g/dL (ref 6.5–8.1)

## 2020-05-25 LAB — CBC WITH DIFFERENTIAL/PLATELET
Abs Immature Granulocytes: 0.01 10*3/uL (ref 0.00–0.07)
Basophils Absolute: 0.1 10*3/uL (ref 0.0–0.1)
Basophils Relative: 1 %
Eosinophils Absolute: 0.1 10*3/uL (ref 0.0–0.5)
Eosinophils Relative: 1 %
HCT: 41.6 % (ref 36.0–46.0)
Hemoglobin: 13.6 g/dL (ref 12.0–15.0)
Immature Granulocytes: 0 %
Lymphocytes Relative: 51 %
Lymphs Abs: 2.8 10*3/uL (ref 0.7–4.0)
MCH: 29.9 pg (ref 26.0–34.0)
MCHC: 32.7 g/dL (ref 30.0–36.0)
MCV: 91.4 fL (ref 80.0–100.0)
Monocytes Absolute: 0.3 10*3/uL (ref 0.1–1.0)
Monocytes Relative: 6 %
Neutro Abs: 2.3 10*3/uL (ref 1.7–7.7)
Neutrophils Relative %: 41 %
Platelets: 233 10*3/uL (ref 150–400)
RBC: 4.55 MIL/uL (ref 3.87–5.11)
RDW: 14.6 % (ref 11.5–15.5)
WBC: 5.5 10*3/uL (ref 4.0–10.5)
nRBC: 0 % (ref 0.0–0.2)

## 2020-05-25 NOTE — Progress Notes (Signed)
Hematology/Oncology Consult note [facility]  [phone] [phone]  Patient Care Team: Kirk Ruths, MD as PCP - General (Internal Medicine) Telford Nab, RN as Oncology Nurse Navigator   Name of the patient: Ann [name]  [id number]  [birth year]   Date of visit: 05/25/20  Diagnosis-history of stage I lung cancer s/p surgery  Chief complaint/ Reason for visit-discuss CT scan results and further management  Heme/Onc history: Patient is a 65 year old female with a past medical history significant for tobacco dependence, peripheral vascular disease and coronary artery disease.  She underwent CT lung cancer screening protocol in September 2021 which showed a suspicious right upper lobe lung nodule 10.3 mm in size.  This was followed by a PET scan which showed hypermetabolic spiculated right upper lobe lung nodule with an SUV of 7 T1 8 with no evidence of locoregional adenopathy or distant metastatic disease.  Incidental finding of an enlarged right lobe of the thyroid gland.  Intense metabolic activity at the level of distal rectum/anus favored physiologic  Patient was evaluated by Dr. 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  No evidence of visceral pleural invasion.  There was another tumor also in the upper lobe of the lung 0.8 cm which was adenocarcinoma in situ nonmucinous.  Margins negative lymph node sampling of 7 nodes did not show any evidence of malignancy pT1b pN0  Patient had pleural effusion post surgery which was tapped and was negative for malignancy.  Interval history-patient reports doing well post surgery.  Her pain has gone down and she was able to gain weight.  Denies any specific complaints at this time  ECOG PS- 1 Pain scale- 0  Review of systems- Review of Systems  Constitutional:  Negative for chills, fever, malaise/fatigue and weight loss.  HENT: Negative for congestion, ear discharge and nosebleeds.   Eyes: Negative for blurred vision.  Respiratory: Negative for cough, hemoptysis, sputum production, shortness of breath and wheezing.   Cardiovascular: Negative for chest pain, palpitations, orthopnea and claudication.  Gastrointestinal: Negative for abdominal pain, blood in stool, constipation, diarrhea, heartburn, melena, nausea and vomiting.  Genitourinary: Negative for dysuria, flank pain, frequency, hematuria and urgency.  Musculoskeletal: Negative for back pain, joint pain and myalgias.  Skin: Negative for rash.  Neurological: Negative for dizziness, tingling, focal weakness, seizures, weakness and headaches.  Endo/Heme/Allergies: Does not bruise/bleed easily.  Psychiatric/Behavioral: Negative for depression and suicidal ideas. The patient does not have insomnia.      Allergies  Allergen Reactions  . Clopidogrel Anaphylaxis and Hives     Past Medical History:  Diagnosis Date  . Aortic atherosclerosis ([place])   . Carotid artery stenosis   . COPD (chronic obstructive pulmonary disease) ([place])   . Coronary artery disease    NIR stent to RCA  . GERD (gastroesophageal reflux disease)   . HLD (hyperlipidemia)   . Hypertension Hypoxic  . Hypokalemia   . 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 Multiple thyroid nodules   . Myocardial infarction ([place]) 09/1998  . Stroke [facility]) 2005   TIA  . Syncope      Past Surgical History:  Procedure Laterality Date  . BACK SURGERY     DECOMRESSION L4-5 WITH RODS AND FUSION  . CAROTID STENT  07/18/2003  . COLONOSCOPY    . NECK SURGERY    .  THORACOTOMY/LOBECTOMY Right 11/04/2019   Procedure: THORACOTOMY/LOBECTOMY;  Surgeon: Nestor Lewandowsky, MD;  Location: [facility] ORS;  Service: Thoracic;  Laterality: Right;  . THYROIDECTOMY N/A 02/25/2020   Procedure: THYROIDECTOMY, total;   Surgeon: [name], MD;  Location: [facility] ORS;  Service: General;  Laterality: N/A;  Provider requesting 2.5 hours/150 minutes for procedure.  . TONSILLECTOMY    . TUBAL LIGATION      Social History   Socioeconomic History  . Marital status: Married    Spouse name: Not on file  . Number of children: Not on file  . Years of education: Not on file  . Highest education level: Not on file  Occupational History  . Not on file  Tobacco Use  . Smoking status: Former Smoker    Packs/day: 0.25    Years: 49.00    Pack years: 12.25    Types: Cigarettes    Quit date: 10/17/2019    Years since quitting: 0.6  . Smokeless tobacco: Never Used  . Tobacco comment: 1 pack lasts 3-4 days  Vaping Use  . Vaping Use: Never used  Substance and Sexual Activity  . Alcohol use: Yes    Comment: 1 shot of vodka with lemonade every evening  . Drug use: Never  . Sexual activity: Not on file  Other Topics Concern  . Not on file  Social History Narrative  . Not on file   Social Determinants of Health   Financial Resource Strain: Not on file  Food Insecurity: Not on file  Transportation Needs: Not on file  Physical Activity: Not on file  Stress: Not on file  Social Connections: Not on file  Intimate Partner Violence: Not on file    Family History  Problem Relation Age of Onset  . Alzheimer's disease Mother   . Heart attack Father   . Diabetes Mellitus II Brother   . Diabetes Mellitus II Brother      Current Outpatient Medications:  .  aspirin 325 MG tablet, Take 325 mg by mouth daily. , Disp: , Rfl:  .  furosemide (LASIX) 20 MG tablet, Take 20 mg by mouth daily as needed for edema. , Disp: , Rfl:  .  levothyroxine (SYNTHROID) 88 MCG tablet, Take 1 tablet (88 mcg total) by mouth daily at 6 (six) AM., Disp: 30 tablet, Rfl: 3 .  metoprolol tartrate (LOPRESSOR) 25 MG tablet, Take 25 mg by mouth 2 (two) times daily., Disp: , Rfl:  .  pantoprazole (PROTONIX) 40 MG tablet, Take 40 mg by  mouth daily., Disp: , Rfl:  .  potassium chloride (KLOR-CON) 10 MEQ tablet, Take 10 mEq by mouth daily., Disp: , Rfl:  .  ibuprofen (ADVIL) 600 MG tablet, Take 1 tablet (600 mg total) by mouth every 6 (six) hours as needed (for mild pain not relieved by other medications.). (Patient not taking: Reported on 05/25/2020), Disp: 30 tablet, Rfl: 0 .  RYBELSUS 7 MG TABS, Take 1 tablet by mouth daily., Disp: , Rfl:   Physical exam:  Vitals:   05/25/20 1325  BP: 120/76  Pulse: 88  Resp: 20  SpO2: 100%  Weight: 139 lb 12.8 oz (63.4 kg)   Physical Exam Cardiovascular:     Rate and Rhythm: Normal rate and regular rhythm.     Heart sounds: Normal heart sounds.  Pulmonary:     Effort: Pulmonary effort is normal.     Breath sounds: Normal breath sounds.  Abdominal:     General: Bowel sounds are normal.  Palpations: Abdomen is soft.  Skin:    General: Skin is warm and dry.  Neurological:     Mental Status: She is alert and oriented to person, place, and time.      CMP Latest Ref Rng & Units 05/25/2020  Glucose 70 - 99 mg/dL 125(H)  BUN 8 - 23 mg/dL 12  Creatinine 0.44 - 1.00 mg/dL 0.84  Sodium 135 - 145 mmol/L 141  Potassium 3.5 - 5.1 mmol/L 3.4(L)  Chloride 98 - 111 mmol/L 102  CO2 22 - 32 mmol/L 27  Calcium 8.9 - 10.3 mg/dL 8.8(L)  Total Protein 6.5 - 8.1 g/dL 7.0  Total Bilirubin 0.3 - 1.2 mg/dL 0.5  Alkaline Phos 38 - 126 U/L 116  AST 15 - 41 U/L 20  ALT 0 - 44 U/L 23   CBC Latest Ref Rng & Units 05/25/2020  WBC 4.0 - 10.5 K/uL 5.5  Hemoglobin 12.0 - 15.0 g/dL 13.6  Hematocrit 36.0 - 46.0 % 41.6  Platelets 150 - 400 K/uL 233    No images are attached to the encounter.  CT Chest Wo Contrast  Result Date: 05/21/2020 CLINICAL DATA:  Non-small cell lung cancer.  Restaging EXAM: CT CHEST WITHOUT CONTRAST TECHNIQUE: Multidetector CT imaging of the chest was performed following the standard protocol without IV contrast. COMPARISON:  01/27/2020. FINDINGS: Cardiovascular: The  heart size appears normal. Aortic atherosclerosis. Coronary artery calcifications. No pericardial effusion. Mediastinum/Nodes: No discrete thyroid nodule. The trachea appears patent and is midline. Normal appearance of the esophagus. No enlarged axillary, supraclavicular or mediastinal adenopathy. Hilar lymph nodes are suboptimally evaluated due to lack of IV contrast. Lungs/Pleura: Right upper lobectomy. No complicating features identified. No pleural effusion, airspace consolidation, or pneumothorax. Centrilobular and paraseptal emphysema. No specific findings identified to suggest local tumor recurrence. Tiny nonspecific nodule in the left upper lobe measures 4 mm, image 51/3. New from the previous exam. Upper Abdomen: No acute abnormalities within the imaged portions of the upper abdomen. Cyst within upper pole of the right kidney measures 2.1 cm. Aortic atherosclerosis identified. Musculoskeletal: No chest wall mass or suspicious bone lesions identified. IMPRESSION: 1. Status post right upper lobectomy. No specific findings identified to suggest local tumor recurrence or metastatic disease. 2. Tiny nonspecific nodule in the left upper lobe measures 4 mm. New from previous exam. Attention on future surveillance imaging is advised. 3. Emphysema and aortic atherosclerosis. 4. Coronary artery calcifications. Aortic Atherosclerosis (ICD10-I70.0) and Emphysema (ICD10-J43.9). Electronically Signed   By: Kerby Moors M.D.   On: 05/21/2020 16:48     Assessment and plan- Patient is a 65 y.o. female with history of stage I right upper lobe lung cancer s/p upper lobectomy here to discuss CT scan results and further management  I have reviewed CT chest images independently and discussed findings with the patient.  Stable postsurgical changes in the area of upper lobectomy with no concerning findings of recurrence.  There was however a left upper lobe 4 mm nodule noted which was new as compared to prior exam.  I will  plan to get a repeat CT chest without contrast in 6 months to monitor this nodule.  I will see her thereafter   Visit Diagnosis 1. Encounter for follow-up surveillance of lung cancer      Dr. Randa Evens, MD, MPH [facility] [phone] 05/25/2020 3:55 PM

## 2020-05-26 LAB — CUP PACEART REMOTE DEVICE CHECK
Date Time Interrogation Session: 20220524081657
Implantable Pulse Generator Implant Date: 20210928

## 2020-06-11 NOTE — Progress Notes (Signed)
Carelink Summary Report / Loop Recorder 

## 2020-06-17 DIAGNOSIS — Z8616 Personal history of COVID-19: Secondary | ICD-10-CM

## 2020-06-17 HISTORY — DX: Personal history of COVID-19: Z86.16

## 2020-06-21 ENCOUNTER — Other Ambulatory Visit: Payer: Self-pay

## 2020-06-24 ENCOUNTER — Ambulatory Visit (INDEPENDENT_AMBULATORY_CARE_PROVIDER_SITE_OTHER): Payer: Medicare Other

## 2020-06-24 DIAGNOSIS — I639 Cerebral infarction, unspecified: Secondary | ICD-10-CM

## 2020-06-28 LAB — CUP PACEART REMOTE DEVICE CHECK
Date Time Interrogation Session: 20220626081722
Implantable Pulse Generator Implant Date: 20210928

## 2020-07-13 NOTE — Progress Notes (Signed)
Carelink Summary Report / Loop Recorder 

## 2020-07-26 ENCOUNTER — Ambulatory Visit (INDEPENDENT_AMBULATORY_CARE_PROVIDER_SITE_OTHER): Payer: Medicare Other

## 2020-07-26 DIAGNOSIS — I639 Cerebral infarction, unspecified: Secondary | ICD-10-CM | POA: Diagnosis not present

## 2020-07-30 LAB — CUP PACEART REMOTE DEVICE CHECK
Date Time Interrogation Session: 20220729082038
Implantable Pulse Generator Implant Date: 20210928

## 2020-08-09 ENCOUNTER — Other Ambulatory Visit: Payer: Self-pay | Admitting: Internal Medicine

## 2020-08-09 ENCOUNTER — Other Ambulatory Visit (HOSPITAL_COMMUNITY): Payer: Self-pay | Admitting: Internal Medicine

## 2020-08-09 DIAGNOSIS — R1033 Periumbilical pain: Secondary | ICD-10-CM

## 2020-08-17 NOTE — Progress Notes (Signed)
Carelink Summary Report / Loop Recorder 

## 2020-08-18 ENCOUNTER — Other Ambulatory Visit: Payer: Self-pay

## 2020-08-18 ENCOUNTER — Ambulatory Visit
Admission: RE | Admit: 2020-08-18 | Discharge: 2020-08-18 | Disposition: A | Discharge status: Home / Self Care | Payer: Medicare Other | Source: Ambulatory Visit | Attending: Internal Medicine | Admitting: Internal Medicine

## 2020-08-18 DIAGNOSIS — R1033 Periumbilical pain: Secondary | ICD-10-CM | POA: Diagnosis present

## 2020-08-18 LAB — POCT I-STAT CREATININE: Creatinine, Ser: 0.7 mg/dL (ref 0.44–1.00)

## 2020-08-18 MED ORDER — IOHEXOL 350 MG/ML SOLN
80.0000 mL | Freq: Once | INTRAVENOUS | Status: AC | PRN
  Administered 2020-08-18: 80 mL via INTRAVENOUS

## 2020-08-26 ENCOUNTER — Ambulatory Visit (INDEPENDENT_AMBULATORY_CARE_PROVIDER_SITE_OTHER): Payer: Medicare Other

## 2020-08-26 DIAGNOSIS — I639 Cerebral infarction, unspecified: Secondary | ICD-10-CM | POA: Diagnosis not present

## 2020-09-01 LAB — CUP PACEART REMOTE DEVICE CHECK
Date Time Interrogation Session: 20220831082010
Implantable Pulse Generator Implant Date: 20210928

## 2020-09-07 ENCOUNTER — Other Ambulatory Visit: Payer: Self-pay | Admitting: Internal Medicine

## 2020-09-07 DIAGNOSIS — Z1231 Encounter for screening mammogram for malignant neoplasm of breast: Secondary | ICD-10-CM

## 2020-09-09 NOTE — Progress Notes (Signed)
Carelink Summary Report / Loop Recorder 

## 2020-09-21 ENCOUNTER — Ambulatory Visit (INDEPENDENT_AMBULATORY_CARE_PROVIDER_SITE_OTHER): Payer: Medicare Other | Admitting: General Surgery

## 2020-09-21 ENCOUNTER — Encounter: Payer: Self-pay | Admitting: General Surgery

## 2020-09-21 ENCOUNTER — Other Ambulatory Visit: Payer: Self-pay

## 2020-09-21 VITALS — BP 157/92 | HR 73 | Temp 98.3°F | Ht 63.0 in | Wt 151.2 lb

## 2020-09-21 DIAGNOSIS — K429 Umbilical hernia without obstruction or gangrene: Secondary | ICD-10-CM | POA: Diagnosis not present

## 2020-09-21 NOTE — Patient Instructions (Addendum)
Our surgery scheduler will call you within 24-48 hours to schedule your surgery. Please have the [place] [place] surgery sheet available when speaking with her.   Umbilical Hernia, Adult A hernia is a bulge of tissue that pushes through an opening between muscles. An umbilical hernia happens in the abdomen, near the belly button (umbilicus). The hernia may contain tissues from the small intestine, large intestine, or fatty tissue covering the intestines (omentum). Umbilical hernias in adults tend to get worse over time, and they require surgical treatment. There are several types of umbilical hernias. You may have: A hernia located just above or below the umbilicus (indirect hernia). This is the most common type of umbilical hernia in adults. A hernia that forms through an opening formed by the umbilicus (direct hernia). A hernia that comes and goes (reducible hernia). A reducible hernia may be visible only when you strain, lift something heavy, or cough. This type of hernia can be pushed back into the abdomen (reduced). A hernia that traps abdominal tissue inside the hernia (incarcerated hernia). This type of hernia cannot be reduced. A hernia that cuts off blood flow to the tissues inside the hernia (strangulated hernia). The tissues can start to die if this happens. This type of hernia requires emergency treatment. What are the causes? An umbilical hernia happens when tissue inside the abdomen presses on a weak area of the abdominal muscles. What increases the risk? You may have a greater risk of this condition if you: Are obese. Have had several pregnancies. Have a buildup of fluid inside your abdomen (ascites). Have had surgery that weakens the abdominal muscles. What are the signs or symptoms? The main symptom of this condition is a painless bulge at or near the belly button. A reducible hernia may be visible only when you strain, lift something heavy, or cough. Other symptoms may include: Dull  pain. A feeling of pressure. Symptoms of a strangulated hernia may include: Pain that gets increasingly worse. Nausea and vomiting. Pain when pressing on the hernia. Skin over the hernia becoming red or purple. Constipation. Blood in the stool. How is this diagnosed? This condition may be diagnosed based on: A physical exam. You may be asked to cough or strain while standing. These actions increase the pressure inside your abdomen and force the hernia through the opening in your muscles. Your health care provider may try to reduce the hernia by pressing on it. Your symptoms and medical history. How is this treated? Surgery is the only treatment for an umbilical hernia. Surgery for a strangulated hernia is done as soon as possible. If you have a small hernia that is not incarcerated, you may need to lose weight before having surgery. Follow these instructions at home: Lose weight, if told by your health care provider. Do not try to push the hernia back in. Watch your hernia for any changes in color or size. Tell your health care provider if any changes occur. You may need to avoid activities that increase pressure on your hernia. Do not lift anything that is heavier than 10 lb (4.5 kg) until your health care provider says that this is safe. Take over-the-counter and prescription medicines only as told by your health care provider. Keep all follow-up visits as told by your health care provider. This is important. Contact a health care provider if: Your hernia gets larger. Your hernia becomes painful. Get help right away if: You develop sudden, severe pain near the area of your hernia. You have pain as  well as nausea or vomiting. You have pain and the skin over your hernia changes color. You develop a fever. This information is not intended to replace advice given to you by your health care provider. Make sure you discuss any questions you have with your health care provider. Document  Revised: 01/31/2017 Document Reviewed: 06/19/2016 Elsevier Patient Education  [place].

## 2020-09-21 NOTE — Progress Notes (Signed)
Patient ID: Ann [name], female   DOB: May 30, 1955, 65 y.o.   MRN: [id number]  Chief Complaint  Patient presents with   Follow-up    Periumbilical hernia x several months    HPI Ann [name] is a 65 y.o. female.   I have seen her in the past and performed a thyroidectomy on her earlier this year for what proved to be multifocal papillary thyroid carcinoma.  She did not require radioactive iodine as a result, due to low risk tumor profile.  She is currently taking 88 mcg of levothyroxine daily.  She is very concerned about the weight gain she is experienced since her operation.  Looking back in her chart, however, her TSH was appropriate in June of this year.  Aside from this, however, she began to experience periumbilical pain a couple of months ago.  She saw her primary care provider who thought perhaps she had an umbilical hernia, but could not appreciate 1 on exam.  A CT scan of the abdomen and pelvis was performed.  Although the radiologist did not comment on it, there is a small umbilical hernia.  She was referred to see Dr. Lysle Pearl, but he noted that she has previously been followed in our office for a number of surgical issues and she also expressed a desire to continue her care here, therefore he referred her back to [place] for further evaluation of this small umbilical hernia.  She denies any nausea or vomiting.  No alterations in bowel function.  She believes that her recent weight gain is a contributing factor to the development of this hernia.  She is scheduled to see Dr. Honor Junes tomorrow; I anticipate that he will recheck her labs and make any necessary adjustments to her thyroid hormone replacement.   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)   Multiple thyroid nodules    Myocardial infarction ([place]) 09/1998   Stroke ([place]) 2005   TIA   Syncope     Past Surgical History:  Procedure Laterality Date   BACK SURGERY     DECOMRESSION L4-5 WITH RODS AND FUSION   CAROTID STENT  07/18/2003   COLONOSCOPY     NECK SURGERY     THORACOTOMY/LOBECTOMY Right 11/04/2019   Procedure: THORACOTOMY/LOBECTOMY;  Surgeon: Nestor Lewandowsky, MD;  Location: [facility] ORS;  Service: Thoracic;  Laterality: Right;   THYROIDECTOMY N/A 02/25/2020   Procedure: THYROIDECTOMY, total;  Surgeon: [name], MD;  Location: [facility] ORS;  Service: General;  Laterality: N/A;  Provider requesting 2.5 hours/150 minutes for procedure.   TONSILLECTOMY     TUBAL LIGATION      Family History  Problem Relation Age of Onset   Alzheimer's disease Mother    Heart attack Father    Diabetes Mellitus II Brother    Diabetes Mellitus II Brother     Social History Social History   Tobacco Use   Smoking status: Former    Packs/day: 0.25    Years: 49.00    Pack years: 12.25    Types: Cigarettes    Quit date: 10/17/2019    Years  since quitting: 0.9   Smokeless tobacco: Never   Tobacco comments:    1 pack lasts 3-4 days  Vaping Use   Vaping Use: Never used  Substance Use Topics   Alcohol use: Yes    Comment: 1 shot of vodka with lemonade every evening   Drug use: Never    Allergies  Allergen Reactions   Clopidogrel Anaphylaxis and Hives    Current Outpatient Medications  Medication Sig Dispense Refill   aspirin 325 MG tablet Take 325 mg by mouth daily.      furosemide (LASIX) 20 MG tablet Take 20 mg by mouth daily as needed for edema.      ibuprofen (ADVIL) 600 MG tablet Take 1 tablet (600 mg total) by mouth every 6 (six) hours as needed (for mild pain not relieved by other medications.). 30 tablet 0   levothyroxine (SYNTHROID) 88 MCG tablet Take 1 tablet (88  mcg total) by mouth daily at 6 (six) AM. 30 tablet 3   metoprolol tartrate (LOPRESSOR) 25 MG tablet Take 25 mg by mouth 2 (two) times daily.     pantoprazole (PROTONIX) 40 MG tablet Take 40 mg by mouth daily.     potassium chloride (KLOR-CON) 10 MEQ tablet Take 10 mEq by mouth daily.     No current facility-administered medications for this visit.    Review of Systems Review of Systems  Constitutional:  Positive for unexpected weight change.  Gastrointestinal:  Positive for abdominal pain.  All other systems reviewed and are negative.  Blood pressure (!) 157/92, pulse 73, temperature 98.3 F (36.8 C), temperature source Oral, height 5\' 3"  (1.6 m), weight 151 lb 3.2 oz (68.6 kg), SpO2 96 %. Body mass index is 26.78 kg/m.  Physical Exam Physical Exam Constitutional:      General: She is not in acute distress.    Appearance: She is normal weight.  HENT:     Head: Normocephalic and atraumatic.     Nose:     Comments: Covered with a mask    Mouth/Throat:     Comments: Covered with a mask Eyes:     General: No scleral icterus.       Right eye: No discharge.        Left eye: No discharge.  Neck:     Comments: The thyroid is surgically absent.  She has no palpable cervical or supraclavicular lymphadenopathy.  The trachea is midline.  No masses appreciated in the thyroidectomy bed.  Her thyroidectomy scar is well-healed and faded. Cardiovascular:     Rate and Rhythm: Normal rate and regular rhythm.  Pulmonary:     Effort: Pulmonary effort is normal. No respiratory distress.  Abdominal:     Palpations: Abdomen is soft.     Tenderness: There is abdominal tenderness.     Comments: She is tender to palpation at the umbilicus.  I am not able to appreciate a fascial defect or bulge on physical examination, even with provocative maneuvers.  Genitourinary:    Comments: Deferred Musculoskeletal:        General: No swelling or deformity.  Skin:    General: Skin is warm and dry.   Neurological:     General: No focal deficit present.     Mental Status: She is alert and oriented to person, place, and time.  Psychiatric:        Mood and Affect: Mood normal.        Behavior: Behavior normal.    Data Reviewed I reviewed  the thyroid function test performed in June of this year.  Her TSH is within normal limits at 1.832.  I reviewed Dr. Ines Bloomer clinic note describing an incarcerated but not strangulated umbilical hernia and his referral back to our office for surgical intervention.  I personally reviewed the CT scan of the abdomen that was performed on August 18, 2020.  The radiology interpretation does not comment on the presence of an umbilical hernia, but I can see one on my personal review of the imaging.    Assessment This is a 65 year old woman with periumbilical pain and a symptomatic umbilical hernia.  She is also complaining of weight gain despite appropriate thyroid hormone replacement.  Plan I have offered her an open umbilical hernia repair.  I discussed the possible need to use mesh, depending on the size of the defect.  I also discussed other risks, including but not limited to, bleeding, infection, failure to resolve pain, hernia recurrence, injury to bowel or other structures.  As she is seeing Dr. Honor Junes tomorrow, I will allow him to monitor her thyroid levels and make any necessary adjustments, as well as address her undesired weight gain.  In the meantime, for her periumbilical discomfort, I have recommended that she alternate taking ibuprofen and Tylenol on a scheduled basis.    [name] 09/21/2020, 2:24 PM

## 2020-09-22 ENCOUNTER — Telehealth: Payer: Self-pay | Admitting: General Surgery

## 2020-09-22 NOTE — Telephone Encounter (Signed)
Outgoing call is made, left message for patient to call.  Please inform patient of Pre-Admission date/time, COVID Testing date and Surgery date.  Surgery Date: 10/08/20 Preadmission Testing Date: 09/30/20 (phone 1p-5p) Covid Testing Date: Not needed.     Also patient will need to call at [phone], between 1-3:00pm the day before surgery, to find out what time to arrive for surgery.

## 2020-09-24 NOTE — Telephone Encounter (Signed)
Outgoing call is made again, patient is now aware of all dates regarding her surgery and verbalized understanding.

## 2020-09-29 ENCOUNTER — Ambulatory Visit
Admission: RE | Admit: 2020-09-29 | Discharge: 2020-09-29 | Disposition: A | Discharge status: Home / Self Care | Payer: Medicare Other | Source: Ambulatory Visit | Attending: Internal Medicine | Admitting: Internal Medicine

## 2020-09-29 ENCOUNTER — Other Ambulatory Visit: Payer: Self-pay

## 2020-09-29 DIAGNOSIS — Z1231 Encounter for screening mammogram for malignant neoplasm of breast: Secondary | ICD-10-CM | POA: Insufficient documentation

## 2020-09-30 ENCOUNTER — Inpatient Hospital Stay
Admission: RE | Admit: 2020-09-30 | Discharge: 2020-09-30 | Disposition: A | Discharge status: Home / Self Care | Payer: Medicare Other | Source: Ambulatory Visit

## 2020-10-01 ENCOUNTER — Encounter: Admission: RE | Admit: 2020-10-01 | Payer: Medicare Other | Source: Ambulatory Visit

## 2020-10-01 ENCOUNTER — Other Ambulatory Visit: Payer: Self-pay

## 2020-10-01 ENCOUNTER — Encounter
Admission: RE | Admit: 2020-10-01 | Discharge: 2020-10-01 | Disposition: A | Discharge status: Home / Self Care | Payer: Medicare Other | Source: Ambulatory Visit | Attending: General Surgery | Admitting: General Surgery

## 2020-10-01 NOTE — Patient Instructions (Addendum)
Your procedure is scheduled on:  10/08/2020  Report to the Registration Desk on the 1st floor of the [facility]. To find out your arrival time, please call [phone] between 1PM - 3PM on: 10/07/2020   REMEMBER: Instructions that are not followed completely may result in serious medical risk, up to and including death; or upon the discretion of your surgeon and anesthesiologist your surgery may need to be rescheduled.  Do not eat food after midnight the night before surgery.  No gum chewing, lozengers or hard candies.  You may however, drink CLEAR liquids up to 2 hours before you are scheduled to arrive for your surgery. Do not drink anything within 2 hours of your scheduled arrival time.  Clear liquids include: - water  - apple juice without pulp - gatorade (not RED, PURPLE, OR BLUE) - black coffee or tea (Do NOT add milk or creamers to the coffee or tea) Do NOT drink anything that is not on this list.     TAKE THESE MEDICATIONS THE MORNING OF SURGERY WITH A SIP OF WATER: Celebrex Levothyroxine Metoprolol Protonix  -  (take one the night before and one on the morning of surgery - helps to prevent nausea after surgery.) Potassium chloride    Use inhalers on the day of surgery and bring to the hospital.    Follow recommendations from Cardiologist, Pulmonologist or PCP regarding stopping Aspirin,  One week prior to surgery: Stop Anti-inflammatories (NSAIDS) such as Advil, Aleve, Ibuprofen, Motrin, Naproxen, Naprosyn and Aspirin based products such as Excedrin, Goodys Powder, BC Powder. Stop ANY OVER THE COUNTER supplements until after surgery. You may however, continue to take Tylenol if needed for pain up until the day of surgery.  No Alcohol for 24 hours before or after surgery.  No Smoking including e-cigarettes for 24 hours prior to surgery.  No chewable tobacco products for at least 6 hours prior to surgery.  No nicotine patches on the day of surgery.  Do not  use any "recreational" drugs for at least a week prior to your surgery.  Please be advised that the combination of cocaine and anesthesia may have negative outcomes, up to and including death. If you test positive for cocaine, your surgery will be cancelled.  On the morning of surgery brush your teeth with toothpaste and water, you may rinse your mouth with mouthwash if you wish. Do not swallow any toothpaste or mouthwash.  Use CHG Soap or wipes as directed on instruction sheet.  Do not wear jewelry, make-up, hairpins, clips or nail polish.  Do not wear lotions, powders, or perfumes.   Do not shave body from the neck down 48 hours prior to surgery just in case you cut yourself which could leave a site for infection.  Also, freshly shaved skin may become irritated if using the CHG soap.  Contact lenses, hearing aids and dentures may not be worn into surgery.  Do not bring valuables to the hospital. [facility] is not responsible for any missing/lost belongings or valuables.     Notify your doctor if there is any change in your medical condition (cold, fever, infection).  Wear comfortable clothing (specific to your surgery type) to the hospital.  After surgery, you can help prevent lung complications by doing breathing exercises.  Take deep breaths and cough every 1-2 hours. Your doctor may order a device called an Incentive Spirometer to help you take deep breaths.  When coughing or sneezing, hold a pillow firmly against your incision  with both hands. This is called "splinting." Doing this helps protect your incision. It also decreases belly discomfort.  If you are being admitted to the hospital overnight, leave your suitcase in the car. After surgery it may be brought to your room.  If you are being discharged the day of surgery, you will not be allowed to drive home. You will need a responsible adult (18 years or older) to drive you home and stay with you that night.   If you  are taking public transportation, you will need to have a responsible adult (18 years or older) with you. Please confirm with your physician that it is acceptable to use public transportation.   Please call the [facility] at [phone] if you have any questions about these instructions.  Surgery Visitation Policy:  Patients undergoing a surgery or procedure may have one family member or support person with them as long as that person is not COVID-19 positive or experiencing its symptoms.  That person may remain in the waiting area during the procedure and may rotate out with other people.  Inpatient Visitation:    Visiting hours are 7 a.m. to 8 p.m. Up to two visitors ages 16+ are allowed at one time in a patient room. The visitors may rotate out with other people during the day. Visitors must check out when they leave, or other visitors will not be allowed. One designated support person may remain overnight. The visitor must pass COVID-19 screenings, use hand sanitizer when entering and exiting the patient's room and wear a mask at all times, including in the patient's room. Patients must also wear a mask when staff or their visitor are in the room. Masking is required regardless of vaccination status.

## 2020-10-01 NOTE — Patient Instructions (Addendum)
Your procedure is scheduled on: 10/08/2020 Report to the Registration Desk on the 1st floor of the [facility]. To find out your arrival time, please call [phone] between 1PM - 3PM on: 10/07/2020   REMEMBER: Instructions that are not followed completely may result in serious medical risk, up to and including death; or upon the discretion of your surgeon and anesthesiologist your surgery may need to be rescheduled.  Do not eat food after midnight the night before surgery.  No gum chewing, lozengers or hard candies.  You may however, drink CLEAR liquids up to 2 hours before you are scheduled to arrive for your surgery. Do not drink anything within 2 hours of your scheduled arrival time.  Clear liquids include: - water  - apple juice without pulp - gatorade (not RED, PURPLE, OR BLUE) - black coffee or tea (Do NOT add milk or creamers to the coffee or tea) Do NOT drink anything that is not on this list.  Type 1 and Type 2 diabetics should only drink water.  TAKE THESE MEDICATIONS THE MORNING OF SURGERY WITH A SIP OF WATER:  Protonix (take one the night before and one on the morning of surgery - helps to prevent nausea after surgery.) Levothyroxine Metoprolol Potassium chloride  Use inhalers on the day of surgery and bring to the hospital.    Follow recommendations from Cardiologist, Pulmonologist or PCP regarding stopping Aspirin. Please call your heart Doctor for this.  One week prior to surgery: Stop Anti-inflammatories (NSAIDS) such as Advil, Aleve, Ibuprofen, Motrin, Naproxen, Naprosyn and Aspirin based products such as Excedrin, Goodys Powder, BC Powder. Stop ANY OVER THE COUNTER supplements until after surgery. You may however, continue to take Tylenol if needed for pain up until the day of surgery.  No Alcohol for 24 hours before or after surgery.  No Smoking including e-cigarettes for 24 hours prior to surgery.  No chewable tobacco products for at least 6 hours  prior to surgery.  No nicotine patches on the day of surgery.  Do not use any "recreational" drugs for at least a week prior to your surgery.  Please be advised that the combination of cocaine and anesthesia may have negative outcomes, up to and including death. If you test positive for cocaine, your surgery will be cancelled.  On the morning of surgery brush your teeth with toothpaste and water, you may rinse your mouth with mouthwash if you wish. Do not swallow any toothpaste or mouthwash.  Use CHG Soap or wipes as directed on instruction sheet.  Do not wear jewelry, make-up, hairpins, clips or nail polish.  Do not wear lotions, powders, or perfumes.   Do not shave body from the neck down 48 hours prior to surgery just in case you cut yourself which could leave a site for infection.  Also, freshly shaved skin may become irritated if using the CHG soap.  Contact lenses, hearing aids and dentures may not be worn into surgery.  Do not bring valuables to the hospital. [facility] is not responsible for any missing/lost belongings or valuables.     Notify your doctor if there is any change in your medical condition (cold, fever, infection).  Wear comfortable clothing (specific to your surgery type) to the hospital.  After surgery, you can help prevent lung complications by doing breathing exercises.  Take deep breaths and cough every 1-2 hours. Your doctor may order a device called an Incentive Spirometer to help you take deep breaths.  When coughing or  sneezing, hold a pillow firmly against your incision with both hands. This is called "splinting." Doing this helps protect your incision. It also decreases belly discomfort.  If you are being admitted to the hospital overnight, leave your suitcase in the car. After surgery it may be brought to your room.  If you are being discharged the day of surgery, you will not be allowed to drive home. You will need a responsible adult (18  years or older) to drive you home and stay with you that night.   If you are taking public transportation, you will need to have a responsible adult (18 years or older) with you. Please confirm with your physician that it is acceptable to use public transportation.   Please call the [place] Dept[facility] at [phone] if you have any questions about these instructions.  Surgery Visitation Policy:  Patients undergoing a surgery or procedure may have one family member or support person with them as long as that person is not COVID-19 positive or experiencing its symptoms.  That person may remain in the waiting area during the procedure and may rotate out with other people.  Inpatient Visitation:    Visiting hours are 7 a.m. to 8 p.m. Up to two visitors ages 16+ are allowed at one time in a patient room. The visitors may rotate out with other people during the day. Visitors must check out when they leave, or other visitors will not be allowed. One designated support person may remain overnight. The visitor must pass COVID-19 screenings, use hand sanitizer when entering and exiting the patient's room and wear a mask at all times, including in the patient's room. Patients must also wear a mask when staff or their visitor are in the room. Masking is required regardless of vaccination status.

## 2020-10-03 ENCOUNTER — Encounter: Payer: Self-pay | Admitting: General Surgery

## 2020-10-03 NOTE — Progress Notes (Signed)
Perioperative Services  Pre-Admission/Anesthesia Testing Clinical Review  Date: 10/04/20  Patient Demographics:  Name: Ann [name] DOB:   October 10, 1955 MRN:   [id number]  Planned Surgical Procedure(s):   Case: [id number] Date/Time: 10/08/20 0854  Procedure: HERNIA REPAIR UMBILICAL ADULT, open  Anesthesia type: General  Pre-op diagnosis: umbilical hernia  Location: [place] [facility] / [facility]  Surgeons: [name], MD   NOTE: Available PAT nursing documentation and vital signs have been reviewed. Clinical nursing staff has updated patient's PMH/PSHx, current medication list, and drug allergies/intolerances to ensure comprehensive history available to assist in medical decision making as it pertains to the aforementioned surgical procedure and anticipated anesthetic course. Extensive review of available clinical information performed. [place] PMH and PSHx updated with any diagnoses/procedures that  may have been inadvertently omitted during her intake with the pre-admission testing department's nursing staff.  Clinical Discussion:  Ann [name] is a 65 y.o. female who is submitted for pre-surgical anesthesia review and clearance prior to her undergoing the above procedure. Patient is a Former Smoker (12.25 pack years; quit 10/2019). 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  Patient is followed by cardiology Saralyn Pilar, MD). She was last seen in the cardiology clinic on 04/06/2020; notes reviewed.  At the time of her clinic visit, patient doing well overall from a cardiovascular perspective.  She denied any episodes of chest pain, however continue to experience chronic social dyspnea related to her underlying COPD diagnosis and pulmonary malignancy (s/p RUL lobectomy).  She denied any  episodes of PND, orthopnea, palpitations, significant peripheral edema, vertiginous symptoms, or presyncope/syncope.  PMH significant for cardiovascular diagnoses.  Patient with a PMH (+) for MI back in 09/1998. She underwent PCI whereby a NIR stent was placed to her RCA.    TTE that was performed on 10/01/2018 revealed normal left ventricular systolic function with mild LVH; LVEF 55%.  PMH (+) for syncope. Her last syncopal episode resulted in her losing consciousness for at least 5 minutes; did not seek medical attention. Following evaluation by cardiology and electrophysiology, patient underwent loop recorder implantation on 09/30/2019   Blood pressure mildly elevated 146/82 on prescribed diuretic and beta-blocker therapy. Patient is on a statin for her HLD.  She is not diabetic.  Functional capacity, as defined by DASI, is documented as being >/= 4 METS. No changes were made to patient's medication regimen.  Patient follow-up with outpatient cardiology in 6 months or sooner if needed.  Ann [name] is scheduled for an elective UMBILICAL HERNIA REPAIR  on 10/08/2020 with Dr. [name], MD.  Given patient's past medical history significant for cardiovascular diagnoses, presurgical cardiac clearance was sought by the PAT team. Per cardiology, "this patient is optimized for surgery and may proceed with the planned procedural course with a LOW risk of significant perioperative cardiovascular complications". This patient is on daily antiplatelet therapy. She has been instructed on recommendations for holding her daily full dose ASA for 7 days prior to her procedure with plans to restart as soon as postoperative bleeding risk felt to be minimized by her attending surgeon. The patient has been instructed that her last dose of her anticoagulant will be on 09/30/2020.  Patient denies previous perioperative complications with anesthesia in the past. In review of the available records, it is noted that  patient underwent a general anesthetic  course here (ASA III) in 02/2020 without documented complications.   Vitals with BMI 10/01/2020 09/21/2020 05/25/2020  Height 5\' 3"  5\' 3"  -  Weight 152 lbs 151 lbs 3 oz 139 lbs 13 oz  BMI 05.39 76.73 -  Systolic - 419 379  Diastolic - 92 76  Pulse - 73 88    Providers/Specialists:   NOTE: Primary physician provider listed below. Patient may have been seen by APP or partner within same practice.   PROVIDER ROLE / SPECIALTY LAST Lucy Antigua, MD GENERAL SURGERY (SURGEON) 09/21/2020  Kirk Ruths, MD PRIMARY CARE PROVIDER 08/04/2020  Isaias Cowman, MD CARDIOLOGY 04/06/2020  Mee Hives, MD ENDOCRINOLOGY 09/22/2020  Virl Axe, MD CARDIOLOGY/ELECTROPHYSIOLOGY 09/30/2019  Randa Evens, MD ONCOLOGY 05/25/2020   Allergies:  Clopidogrel  Current Home Medications:   No current facility-administered medications for this encounter.    albuterol (VENTOLIN HFA) 108 (90 Base) MCG/ACT inhaler   aspirin 325 MG tablet   celecoxib (CELEBREX) 100 MG capsule   fluticasone (FLONASE) 50 MCG/ACT nasal spray   furosemide (LASIX) 20 MG tablet   levothyroxine (SYNTHROID) 88 MCG tablet   metoprolol tartrate (LOPRESSOR) 25 MG tablet   pantoprazole (PROTONIX) 40 MG tablet   potassium chloride SA (KLOR-CON) 20 MEQ tablet   phentermine (ADIPEX-P) 37.5 MG tablet   History:   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(HCC)    a.) mucinous adenocarcinoma (pathological stage Ia p T1a pN0 cM0); b.) s/p RUL lobectomy 11/04/2019   Aortic atherosclerosis (HCC)    Carotid artery stenosis    COPD (chronic obstructive pulmonary disease) (HCC)    Coronary artery disease    NIR stent to RCA   Diverticulosis    GERD (gastroesophageal reflux disease)    History of 2019 novel coronavirus disease (COVID-19) 06/17/2020   HLD (hyperlipidemia)    Hypertension    Hypokalemia    Implantable loop recorder present  09/30/2019   Left upper lobe pulmonary nodule 05/20/2020   a.) CT on 05/20/2020 --> new "tiny" pulmonary nodule; measured 4 mm.   Multiple thyroid nodules    Myocardial infarction ([place]) 09/03/1998   Renal cyst, right    a.) CT on 05/20/2020 - upper pole RIGHT kidney; measured 2.1 cm   S/P total thyroidectomy    Stroke ([place]) 2005   TIA   Vasovagal syncope    Past Surgical History:  Procedure Laterality Date   CAROTID STENT  07/18/2003   COLONOSCOPY     CORONARY ANGIOPLASTY WITH STENT PLACEMENT Left 09/03/1998   Procedure: CARDIAC CATHETERIZATION AND STENT PLACEMENT (NIR stent to RCA)   LOOP RECORDER INSERTION N/A 09/30/2019   LUMBAR FUSION N/A    L4-L5 SPINAL DECOMPRESSION AND FUSION; RODS PLACED   NECK SURGERY     THORACOTOMY/LOBECTOMY Right 11/04/2019   Procedure: THORACOTOMY/LOBECTOMY;  Surgeon: Nestor Lewandowsky, MD;  Location: [facility];  Service: Thoracic;  Laterality: Right;   THYROIDECTOMY N/A 02/25/2020   Procedure: THYROIDECTOMY, total;  Surgeon: [name], MD;  Location: [facility];  Service: General;  Laterality: N/A;  Provider requesting 2.5 hours/150 minutes for procedure.   TONSILLECTOMY     TUBAL LIGATION     Family History  Problem Relation Age of Onset   Alzheimer's disease Mother    Heart attack Father    Diabetes Mellitus II Brother    Diabetes Mellitus II Brother    Social History   Tobacco Use   Smoking status: Former    Packs/day: 0.25  Years: 49.00    Pack years: 12.25    Types: Cigarettes    Quit date: 10/17/2019    Years since quitting: 0.9   Smokeless tobacco: Never   Tobacco comments:    1 pack lasts 3-4 days  Vaping Use   Vaping Use: Never used  Substance Use Topics   Alcohol use: Yes    Comment: 1 shot of vodka with lemonade every evening   Drug use: Never    Pertinent Clinical Results:  LABS: Labs reviewed: Acceptable for surgery.  Hospital Outpatient Visit on 10/04/2020  Component Date Value Ref Range Status   WBC  10/04/2020 6.0  4.0 - 10.5 K/uL Final   RBC 10/04/2020 4.72  3.87 - 5.11 MIL/uL Final   Hemoglobin 10/04/2020 14.1  12.0 - 15.0 g/dL Final   HCT 10/04/2020 43.4  36.0 - 46.0 % Final   MCV 10/04/2020 91.9  80.0 - 100.0 fL Final   MCH 10/04/2020 29.9  26.0 - 34.0 pg Final   MCHC 10/04/2020 32.5  30.0 - 36.0 g/dL Final   RDW 10/04/2020 15.8 (A) 11.5 - 15.5 % Final   Platelets 10/04/2020 282  150 - 400 K/uL Final   nRBC 10/04/2020 0.0  0.0 - 0.2 % Final   Performed at [facility], [address] [place], [place] [address]   Sodium 10/04/2020 140  135 - 145 mmol/L Final   Potassium 10/04/2020 3.4 (A) 3.5 - 5.1 mmol/L Final   Chloride 10/04/2020 104  98 - 111 mmol/L Final   CO2 10/04/2020 30  22 - 32 mmol/L Final   Glucose, Bld 10/04/2020 148 (A) 70 - 99 mg/dL Final   Glucose reference range applies only to samples taken after fasting for at least 8 hours.   BUN 10/04/2020 15  8 - 23 mg/dL Final   Creatinine, Ser 10/04/2020 0.79  0.44 - 1.00 mg/dL Final   Calcium 10/04/2020 8.7 (A) 8.9 - 10.3 mg/dL Final   GFR, Estimated 10/04/2020 >60  >60 mL/min Final   Comment: (NOTE) Calculated using the CKD-EPI Creatinine Equation (2021)    Anion gap 10/04/2020 6  5 - 15 Final   Performed at [facility], [address] [place], [place] [address]    ECG: Date: 10/04/2020 Time ECG obtained: 1413 PM Rate: 80 bpm Rhythm:  Sinus rhythm with first-degree AV block; LVH Axis (leads I and aVF): Normal Intervals: PR 262 ms. QRS 92 ms. QTc 452 ms. ST segment and T wave changes: No evidence of acute ST segment elevation or depression Comparison: Similar to previous tracing obtained on 01/27/2020   IMAGING / PROCEDURES: CT ABDOMEN PELVIS WITHOUT CONTRAST performed on 08/18/2020 No acute abnormality in the abdomen or pelvis Colonic diverticulosis without findings of acute diverticulitis Aortic atherosclerosis  CT CHEST WITHOUT CONTRAST performed on 05/20/2020 Status post RIGHT  upper lobectomy.  No specific findings identified to suggest local tumor recurrence or metastatic disease. Tiny nonspecific nodule in the LEFT upper lobe measuring 4 mm.  New from previous exam.  Attention on future surveillance imaging is advised. Emphysema Aortic atherosclerosis Coronary artery calcifications  DIAGNOSTIC RADIOGRAPHS CHEST performed on 02/11/2020 Minimal left basilar atelectasis Anterior chest wall cardiac monitoring device Anterior cervical fusion hardware noted   CT CERVICAL SPINE WITHOUT CONTRAST performed on 01/27/2020 No acute cervical spine fracture Previous ACDF spanning C4-C7 with bony fusion across the disc spaces Spondylosis at C2-C3 to a lesser extent C3-C4.   Symmetrical neuroforaminal encroachment at C2-C3 with right neuroforaminal encroachment at  C3-C4 A 3.1 cm right lobe thyroid nodule is noted.  This has been evaluated on previous imaging.   CT CHEST, ABDOMEN, PELVIS WITH CONTRAST performed on 01/27/2020 No evidence of significant pulmonary embolus Emphysematous changes in the lung Mild infiltration or atelectasis in the left costophrenic angle Postoperative right thoracotomy No acute process demonstrated in the abdomen and pelvis Emphysema and aortic atherosclerosis Left common carotid artery origin stenosis   PET SCAN INITIAL SKULL BASE TO THIGH performed on 29/56/2130 Hypermetabolic spiculated nodule in the right upper lobe is most consistent with bronchogenic carcinoma.  FDG PET staging T1a N0 M0. Groundglass nodule right middle lobe.  Recommend attention on follow-up Incidental finding of an enlarged right lobe of thyroid gland with hypermetabolic activity.  Recommend thyroid ultrasound to evaluate for thyroid carcinoma versus adenoma. Incidental finding of intense metabolic activity at the level of the distal rectum/anus.  This is favored physiologic and may relate to stool however there is minimal activity elsewhere in the bowel.  Recommend  physical exam/digital rectal exam.  Impression and Plan:  Ann [name] has been referred for pre-anesthesia review and clearance prior to her undergoing the planned anesthetic and procedural courses. Available labs, pertinent testing, and imaging results were personally reviewed by me. This patient has been appropriately cleared by cardiology with an overall LOW risk of significant perioperative cardiovascular complications.  Based on clinical review performed today (10/04/20), barring any significant acute changes in the patient's overall condition, it is anticipated that she will be able to proceed with the planned surgical intervention. Any acute changes in clinical condition may necessitate her procedure being postponed and/or cancelled. Patient will meet with anesthesia team (MD and/or CRNA) on the day of her procedure for preoperative evaluation/assessment. Questions regarding anesthetic course will be fielded at that time.   Pre-surgical instructions were reviewed with the patient during her PAT appointment and questions were fielded by PAT clinical staff. Patient was advised that if any questions or concerns arise prior to her procedure then she should return a call to PAT and/or her surgeon's office to discuss.  Honor Loh, MSN, APRN, FNP-C, CEN [facility]  Peri-operative Services Nurse Practitioner Phone: [phone] Fax: [phone] 10/04/20 4:24 PM  NOTE: This note has been prepared using Dragon dictation software. Despite my best ability to proofread, there is always the potential that unintentional transcriptional errors may still occur from this process.

## 2020-10-04 ENCOUNTER — Inpatient Hospital Stay: Admission: RE | Admit: 2020-10-04 | Payer: Medicare Other | Source: Ambulatory Visit

## 2020-10-04 ENCOUNTER — Other Ambulatory Visit: Payer: Self-pay

## 2020-10-04 ENCOUNTER — Encounter: Payer: Self-pay | Admitting: Surgery

## 2020-10-04 ENCOUNTER — Encounter
Admission: RE | Admit: 2020-10-04 | Discharge: 2020-10-04 | Disposition: A | Discharge status: Home / Self Care | Payer: Medicare Other | Source: Ambulatory Visit | Attending: General Surgery | Admitting: General Surgery

## 2020-10-04 ENCOUNTER — Encounter: Payer: Self-pay | Admitting: Urgent Care

## 2020-10-04 ENCOUNTER — Ambulatory Visit (INDEPENDENT_AMBULATORY_CARE_PROVIDER_SITE_OTHER): Payer: Medicare Other

## 2020-10-04 DIAGNOSIS — I639 Cerebral infarction, unspecified: Secondary | ICD-10-CM | POA: Diagnosis not present

## 2020-10-04 DIAGNOSIS — Z01810 Encounter for preprocedural cardiovascular examination: Secondary | ICD-10-CM

## 2020-10-04 DIAGNOSIS — Z01818 Encounter for other preprocedural examination: Secondary | ICD-10-CM | POA: Diagnosis not present

## 2020-10-04 LAB — BASIC METABOLIC PANEL
Anion gap: 6 (ref 5–15)
BUN: 15 mg/dL (ref 8–23)
CO2: 30 mmol/L (ref 22–32)
Calcium: 8.7 mg/dL — ABNORMAL LOW (ref 8.9–10.3)
Chloride: 104 mmol/L (ref 98–111)
Creatinine, Ser: 0.79 mg/dL (ref 0.44–1.00)
GFR, Estimated: >60 mL/min (ref >60)
Glucose, Bld: 148 mg/dL — ABNORMAL HIGH (ref 70–99)
Potassium: 3.4 mmol/L — ABNORMAL LOW (ref 3.5–5.1)
Sodium: 140 mmol/L (ref 135–145)

## 2020-10-04 LAB — CUP PACEART REMOTE DEVICE CHECK
Date Time Interrogation Session: 20221003082017
Implantable Pulse Generator Implant Date: 20210928

## 2020-10-04 LAB — CBC
HCT: 43.4 % (ref 36.0–46.0)
Hemoglobin: 14.1 g/dL (ref 12.0–15.0)
MCH: 29.9 pg (ref 26.0–34.0)
MCHC: 32.5 g/dL (ref 30.0–36.0)
MCV: 91.9 fL (ref 80.0–100.0)
Platelets: 282 10*3/uL (ref 150–400)
RBC: 4.72 MIL/uL (ref 3.87–5.11)
RDW: 15.8 % — ABNORMAL HIGH (ref 11.5–15.5)
WBC: 6 10*3/uL (ref 4.0–10.5)
nRBC: 0 % (ref 0.0–0.2)

## 2020-10-07 ENCOUNTER — Telehealth: Payer: Self-pay | Admitting: Oncology

## 2020-10-07 NOTE — Telephone Encounter (Signed)
Spoke with patient to reschedule her appointment on 12/9 (MD will be on PAL for 3 weeks). She confirmed new date for MD and also confirmed her CT scan. Sending appointment reminders in the mail also.

## 2020-10-08 ENCOUNTER — Encounter
Admission: RE | Disposition: A | Discharge status: Home / Self Care | Payer: Self-pay | Source: Home / Self Care | Attending: Surgery

## 2020-10-08 ENCOUNTER — Encounter: Payer: Self-pay | Admitting: Surgery

## 2020-10-08 ENCOUNTER — Ambulatory Visit
Admission: RE | Admit: 2020-10-08 | Discharge: 2020-10-08 | Disposition: A | Discharge status: Home / Self Care | Payer: Medicare Other | Attending: Surgery | Admitting: Surgery

## 2020-10-08 ENCOUNTER — Other Ambulatory Visit: Payer: Self-pay

## 2020-10-08 ENCOUNTER — Ambulatory Visit: Payer: Medicare Other | Admitting: Urgent Care

## 2020-10-08 DIAGNOSIS — K429 Umbilical hernia without obstruction or gangrene: Secondary | ICD-10-CM

## 2020-10-08 DIAGNOSIS — Z791 Long term (current) use of non-steroidal anti-inflammatories (NSAID): Secondary | ICD-10-CM | POA: Diagnosis not present

## 2020-10-08 DIAGNOSIS — Z6826 Body mass index (BMI) 26.0-26.9, adult: Secondary | ICD-10-CM | POA: Diagnosis not present

## 2020-10-08 DIAGNOSIS — Z79899 Other long term (current) drug therapy: Secondary | ICD-10-CM | POA: Diagnosis not present

## 2020-10-08 DIAGNOSIS — Z7982 Long term (current) use of aspirin: Secondary | ICD-10-CM | POA: Diagnosis not present

## 2020-10-08 DIAGNOSIS — E669 Obesity, unspecified: Secondary | ICD-10-CM | POA: Diagnosis not present

## 2020-10-08 DIAGNOSIS — Z8616 Personal history of COVID-19: Secondary | ICD-10-CM | POA: Insufficient documentation

## 2020-10-08 DIAGNOSIS — Z8673 Personal history of transient ischemic attack (TIA), and cerebral infarction without residual deficits: Secondary | ICD-10-CM | POA: Insufficient documentation

## 2020-10-08 DIAGNOSIS — I252 Old myocardial infarction: Secondary | ICD-10-CM | POA: Diagnosis not present

## 2020-10-08 DIAGNOSIS — K219 Gastro-esophageal reflux disease without esophagitis: Secondary | ICD-10-CM | POA: Insufficient documentation

## 2020-10-08 DIAGNOSIS — Z955 Presence of coronary angioplasty implant and graft: Secondary | ICD-10-CM | POA: Insufficient documentation

## 2020-10-08 DIAGNOSIS — I10 Essential (primary) hypertension: Secondary | ICD-10-CM | POA: Insufficient documentation

## 2020-10-08 DIAGNOSIS — E785 Hyperlipidemia, unspecified: Secondary | ICD-10-CM | POA: Insufficient documentation

## 2020-10-08 DIAGNOSIS — J449 Chronic obstructive pulmonary disease, unspecified: Secondary | ICD-10-CM | POA: Diagnosis not present

## 2020-10-08 DIAGNOSIS — I2510 Atherosclerotic heart disease of native coronary artery without angina pectoris: Secondary | ICD-10-CM | POA: Insufficient documentation

## 2020-10-08 DIAGNOSIS — Z87891 Personal history of nicotine dependence: Secondary | ICD-10-CM | POA: Diagnosis not present

## 2020-10-08 DIAGNOSIS — Z79890 Hormone replacement therapy (postmenopausal): Secondary | ICD-10-CM | POA: Insufficient documentation

## 2020-10-08 DIAGNOSIS — E119 Type 2 diabetes mellitus without complications: Secondary | ICD-10-CM | POA: Diagnosis not present

## 2020-10-08 HISTORY — DX: Cyst of kidney, acquired: N28.1

## 2020-10-08 HISTORY — DX: Malignant neoplasm of unspecified part of right bronchus or lung: C34.91

## 2020-10-08 HISTORY — PX: INSERTION OF MESH: SHX5868

## 2020-10-08 HISTORY — DX: Postprocedural hypothyroidism: E89.0

## 2020-10-08 HISTORY — DX: Diverticulosis of intestine, part unspecified, without perforation or abscess without bleeding: K57.90

## 2020-10-08 HISTORY — PX: UMBILICAL HERNIA REPAIR: SHX196

## 2020-10-08 HISTORY — DX: Type 2 diabetes mellitus without complications: E11.9

## 2020-10-08 HISTORY — DX: Acquired absence of other organs: Z90.89

## 2020-10-08 HISTORY — DX: Syncope and collapse: R55

## 2020-10-08 LAB — POCT I-STAT, CHEM 8
BUN: 10 mg/dL (ref 8–23)
Calcium, Ion: 1.2 mmol/L (ref 1.15–1.40)
Chloride: 103 mmol/L (ref 98–111)
Creatinine, Ser: 0.6 mg/dL (ref 0.44–1.00)
Glucose, Bld: 127 mg/dL — ABNORMAL HIGH (ref 70–99)
HCT: 47 % — ABNORMAL HIGH (ref 36.0–46.0)
Hemoglobin: 16 g/dL — ABNORMAL HIGH (ref 12.0–15.0)
Potassium: 3.8 mmol/L (ref 3.5–5.1)
Sodium: 141 mmol/L (ref 135–145)
TCO2: 26 mmol/L (ref 22–32)

## 2020-10-08 LAB — GLUCOSE, CAPILLARY: Glucose-Capillary: 126 mg/dL — ABNORMAL HIGH (ref 70–99)

## 2020-10-08 SURGERY — REPAIR, HERNIA, UMBILICAL, ADULT
Anesthesia: General

## 2020-10-08 MED ORDER — BUPIVACAINE LIPOSOME 1.3 % IJ SUSP
INTRAMUSCULAR | Status: AC
  Filled 2020-10-08: qty 20

## 2020-10-08 MED ORDER — CHLORHEXIDINE GLUCONATE 0.12 % MT SOLN: 15.0000 mL | Freq: Once | OROMUCOSAL | Status: AC

## 2020-10-08 MED ORDER — HYDROCODONE-ACETAMINOPHEN 5-325 MG PO TABS: 1.0000 | ORAL_TABLET | Freq: Four times a day (QID) | ORAL | 0 refills | Status: DC | PRN

## 2020-10-08 MED ORDER — ORAL CARE MOUTH RINSE: 15.0000 mL | Freq: Once | OROMUCOSAL | Status: AC

## 2020-10-08 MED ORDER — ACETAMINOPHEN 500 MG PO TABS
ORAL_TABLET | ORAL | Status: AC
  Administered 2020-10-08: 1000 mg via ORAL
  Filled 2020-10-08: qty 2

## 2020-10-08 MED ORDER — ROCURONIUM BROMIDE 10 MG/ML (PF) SYRINGE
PREFILLED_SYRINGE | INTRAVENOUS | Status: AC
  Filled 2020-10-08: qty 10

## 2020-10-08 MED ORDER — LACTATED RINGERS IV SOLN: INTRAVENOUS | Status: DC

## 2020-10-08 MED ORDER — BUPIVACAINE-EPINEPHRINE (PF) 0.25% -1:200000 IJ SOLN
INTRAMUSCULAR | Status: AC
  Filled 2020-10-08: qty 30

## 2020-10-08 MED ORDER — PROMETHAZINE HCL 25 MG/ML IJ SOLN: 6.2500 mg | INTRAMUSCULAR | Status: DC | PRN

## 2020-10-08 MED ORDER — CHLORHEXIDINE GLUCONATE CLOTH 2 % EX PADS: 6.0000 | MEDICATED_PAD | Freq: Once | CUTANEOUS | Status: DC

## 2020-10-08 MED ORDER — SUGAMMADEX SODIUM 200 MG/2ML IV SOLN
INTRAVENOUS | Status: DC | PRN
  Administered 2020-10-08: 250 mg via INTRAVENOUS

## 2020-10-08 MED ORDER — BUPIVACAINE LIPOSOME 1.3 % IJ SUSP
INTRAMUSCULAR | Status: DC | PRN
  Administered 2020-10-08: 20 mL

## 2020-10-08 MED ORDER — ONDANSETRON HCL 4 MG/2ML IJ SOLN
INTRAMUSCULAR | Status: DC | PRN
  Administered 2020-10-08: 4 mg via INTRAVENOUS

## 2020-10-08 MED ORDER — CHLORHEXIDINE GLUCONATE 0.12 % MT SOLN
OROMUCOSAL | Status: AC
  Administered 2020-10-08: 15 mL via OROMUCOSAL
  Filled 2020-10-08: qty 15

## 2020-10-08 MED ORDER — FENTANYL CITRATE (PF) 100 MCG/2ML IJ SOLN
INTRAMUSCULAR | Status: DC | PRN
  Administered 2020-10-08: 50 ug via INTRAVENOUS

## 2020-10-08 MED ORDER — ONDANSETRON HCL 4 MG/2ML IJ SOLN
INTRAMUSCULAR | Status: AC
  Filled 2020-10-08: qty 2

## 2020-10-08 MED ORDER — EPHEDRINE SULFATE 50 MG/ML IJ SOLN
INTRAMUSCULAR | Status: DC | PRN
  Administered 2020-10-08 (×2): 5 mg via INTRAVENOUS

## 2020-10-08 MED ORDER — ROCURONIUM BROMIDE 100 MG/10ML IV SOLN
INTRAVENOUS | Status: DC | PRN
  Administered 2020-10-08: 40 mg via INTRAVENOUS

## 2020-10-08 MED ORDER — CEFAZOLIN SODIUM-DEXTROSE 2-4 GM/100ML-% IV SOLN
INTRAVENOUS | Status: AC
  Filled 2020-10-08: qty 100

## 2020-10-08 MED ORDER — MEPERIDINE HCL 25 MG/ML IJ SOLN: 6.2500 mg | INTRAMUSCULAR | Status: DC | PRN

## 2020-10-08 MED ORDER — OXYCODONE HCL 5 MG/5ML PO SOLN: 5.0000 mg | Freq: Once | ORAL | Status: AC | PRN

## 2020-10-08 MED ORDER — OXYCODONE HCL 5 MG PO TABS
5.0000 mg | ORAL_TABLET | Freq: Once | ORAL | Status: AC | PRN
  Administered 2020-10-08: 5 mg via ORAL

## 2020-10-08 MED ORDER — SODIUM CHLORIDE 0.9 % IV SOLN: INTRAVENOUS | Status: DC

## 2020-10-08 MED ORDER — BUPIVACAINE-EPINEPHRINE 0.25% -1:200000 IJ SOLN
INTRAMUSCULAR | Status: DC | PRN
  Administered 2020-10-08: 30 mL

## 2020-10-08 MED ORDER — LIDOCAINE HCL (CARDIAC) PF 100 MG/5ML IV SOSY
PREFILLED_SYRINGE | INTRAVENOUS | Status: DC | PRN
  Administered 2020-10-08: 80 mg via INTRAVENOUS

## 2020-10-08 MED ORDER — OXYCODONE HCL 5 MG PO TABS
ORAL_TABLET | ORAL | Status: AC
  Filled 2020-10-08: qty 1

## 2020-10-08 MED ORDER — PROPOFOL 10 MG/ML IV BOLUS
INTRAVENOUS | Status: AC
  Filled 2020-10-08: qty 40

## 2020-10-08 MED ORDER — FENTANYL CITRATE (PF) 100 MCG/2ML IJ SOLN
INTRAMUSCULAR | Status: AC
  Filled 2020-10-08: qty 2

## 2020-10-08 MED ORDER — CEFAZOLIN SODIUM-DEXTROSE 2-4 GM/100ML-% IV SOLN
2.0000 g | INTRAVENOUS | Status: AC
  Administered 2020-10-08: 2 g via INTRAVENOUS

## 2020-10-08 MED ORDER — BUPIVACAINE LIPOSOME 1.3 % IJ SUSP: 20.0000 mL | Freq: Once | INTRAMUSCULAR | Status: DC

## 2020-10-08 MED ORDER — ACETAMINOPHEN 500 MG PO TABS: 1000.0000 mg | ORAL_TABLET | ORAL | Status: AC

## 2020-10-08 MED ORDER — FENTANYL CITRATE (PF) 100 MCG/2ML IJ SOLN: 25.0000 ug | INTRAMUSCULAR | Status: DC | PRN

## 2020-10-08 MED ORDER — PROPOFOL 10 MG/ML IV BOLUS
INTRAVENOUS | Status: DC | PRN
  Administered 2020-10-08: 120 mg via INTRAVENOUS

## 2020-10-08 MED ORDER — LIDOCAINE HCL (PF) 2 % IJ SOLN
INTRAMUSCULAR | Status: AC
  Filled 2020-10-08: qty 5

## 2020-10-08 SURGICAL SUPPLY — 30 items
APL PRP STRL LF DISP 70% ISPRP (MISCELLANEOUS) ×2
APPLIER CLIP 11 MED OPEN (CLIP)
APR CLP MED 11 20 MLT OPN (CLIP)
BLADE CLIPPER SURG (BLADE) IMPLANT
BLADE SURG 15 STRL LF DISP TIS (BLADE) ×2 IMPLANT
BLADE SURG 15 STRL SS (BLADE) ×3
CHLORAPREP W/TINT 26 (MISCELLANEOUS) ×3 IMPLANT
CLIP APPLIE 11 MED OPEN (CLIP) IMPLANT
DRAPE INCISE IOBAN 66X45 STRL (DRAPES) IMPLANT
DRAPE LAPAROTOMY 77X122 PED (DRAPES) ×3 IMPLANT
DRSG TELFA 3X8 NADH (GAUZE/BANDAGES/DRESSINGS) IMPLANT
ELECT CAUTERY BLADE 6.4 (BLADE) ×3 IMPLANT
ELECT REM PT RETURN 9FT ADLT (ELECTROSURGICAL) ×3
ELECTRODE REM PT RTRN 9FT ADLT (ELECTROSURGICAL) ×2 IMPLANT
GAUZE 4X4 16PLY ~~LOC~~+RFID DBL (SPONGE) IMPLANT
GLOVE SURG ENC MOIS LTX SZ7 (GLOVE) ×9 IMPLANT
GOWN STRL REUS W/ TWL LRG LVL3 (GOWN DISPOSABLE) ×4 IMPLANT
GOWN STRL REUS W/TWL LRG LVL3 (GOWN DISPOSABLE) ×6
MANIFOLD NEPTUNE II (INSTRUMENTS) ×3 IMPLANT
MESH VENTRALEX ST 1-7/10 CRC S (Mesh General) ×3 IMPLANT
NEEDLE HYPO 22GX1.5 SAFETY (NEEDLE) ×3 IMPLANT
NS IRRIG 500ML POUR BTL (IV SOLUTION) ×3 IMPLANT
PACK BASIN MINOR ARMC (MISCELLANEOUS) ×3 IMPLANT
SPONGE T-LAP 18X18 ~~LOC~~+RFID (SPONGE) ×3 IMPLANT
SUT ETHIBOND NAB MO 7 #0 18IN (SUTURE) ×3 IMPLANT
SUT MNCRL AB 4-0 PS2 18 (SUTURE) ×3 IMPLANT
SUT VIC AB 3-0 SH 27 (SUTURE) ×3
SUT VIC AB 3-0 SH 27X BRD (SUTURE) ×2 IMPLANT
SYR 20ML LL LF (SYRINGE) ×3 IMPLANT
WATER STERILE IRR 500ML POUR (IV SOLUTION) IMPLANT

## 2020-10-08 NOTE — Anesthesia Preprocedure Evaluation (Signed)
Anesthesia Evaluation  Patient identified by MRN, date of birth, ID band Patient awake    Reviewed: Allergy & Precautions, NPO status , Patient's Chart, lab work & pertinent test results  History of Anesthesia Complications Negative for: history of anesthetic complications  Airway Mallampati: III  TM Distance: >3 FB Neck ROM: Full    Dental  (+) Poor Dentition, Loose   Pulmonary neg sleep apnea, COPD,  COPD inhaler, former smoker,  Lung mass   breath sounds clear to auscultation- rhonchi (-) wheezing      Cardiovascular hypertension, Pt. on medications + CAD, + Past MI and + Cardiac Stents (2005)  (-) CABG  Rhythm:Regular Rate:Normal - Systolic murmurs and - Diastolic murmurs    Neuro/Psych neg Seizures CVA, No Residual Symptoms negative psych ROS   GI/Hepatic negative GI ROS, Neg liver ROS,   Endo/Other  negative endocrine ROSdiabetes  Renal/GU negative Renal ROS     Musculoskeletal negative musculoskeletal ROS (+)   Abdominal (+) - obese,   Peds  Hematology negative hematology ROS (+)   Anesthesia Other Findings Past Medical History: No date: Adenocarcinoma of right lung (HCC)     Comment:  a.) mucinous adenocarcinoma (pathological stage Ia p T1a              pN0 cM0); b.) s/p RUL lobectomy 11/04/2019 No date: Aortic atherosclerosis (HCC) No date: Carotid artery stenosis No date: COPD (chronic obstructive pulmonary disease) (HCC) No date: Coronary artery disease     Comment:  NIR stent to RCA No date: Diabetes mellitus without complication (HCC) No date: Diverticulosis No date: GERD (gastroesophageal reflux disease) 06/17/2020: History of 2019 novel coronavirus disease (COVID-19) No date: HLD (hyperlipidemia) No date: Hypertension No date: Hypokalemia 09/30/2019: Implantable loop recorder present 05/20/2020: Left upper lobe pulmonary nodule     Comment:  a.) CT on 05/20/2020 --> new "tiny" pulmonary  nodule;               measured 4 mm. No date: Multiple thyroid nodules 09/03/1998: Myocardial infarction [facility]) No date: Renal cyst, right     Comment:  a.) CT on 05/20/2020 - upper pole RIGHT kidney; measured              2.1 cm No date: S/P total thyroidectomy 2005: Stroke [facility])     Comment:  TIA No date: Vasovagal syncope    Reproductive/Obstetrics                             Anesthesia Physical  Anesthesia Plan  ASA: 3  Anesthesia Plan: General   Post-op Pain Management:    Induction: Intravenous  PONV Risk Score and Plan: 2 and Ondansetron and Dexamethasone  Airway Management Planned: Oral ETT  Additional Equipment:   Intra-op Plan:   Post-operative Plan: Extubation in OR  Informed Consent: I have reviewed the patients History and Physical, chart, labs and discussed the procedure including the risks, benefits and alternatives for the proposed anesthesia with the patient or authorized representative who has indicated his/her understanding and acceptance.     Dental advisory given  Plan Discussed with: CRNA and Anesthesiologist  Anesthesia Plan Comments:         Anesthesia Quick Evaluation

## 2020-10-08 NOTE — Discharge Instructions (Signed)

## 2020-10-08 NOTE — Anesthesia Postprocedure Evaluation (Signed)
Anesthesia Post Note  Patient: Ann [name]  Procedure(s) Performed: HERNIA REPAIR UMBILICAL ADULT, open INSERTION OF MESH  Patient location during evaluation: PACU Anesthesia Type: General Level of consciousness: awake and alert and oriented Pain management: pain level controlled Vital Signs Assessment: post-procedure vital signs reviewed and stable Respiratory status: spontaneous breathing, nonlabored ventilation and respiratory function stable Cardiovascular status: blood pressure returned to baseline and stable Postop Assessment: no signs of nausea or vomiting Anesthetic complications: no   No notable events documented.   Last Vitals:  Vitals:   10/08/20 1040 10/08/20 1051  BP: 134/79 (!) 158/83  Pulse: 73 74  Resp: (!) 21 18  Temp: (!) 36.2 C (!) 36 C  SpO2: 95% 96%    Last Pain:  Vitals:   10/08/20 1119  TempSrc:   PainSc: 5                  Charleen Madera

## 2020-10-08 NOTE — H&P (Signed)
Patient ID: Ann [name], female   DOB: [birth year], 65 y.o.   MRN: [id number]  HPI Ann [name] is a 65 y.o. female with periumbilical pain for several weeks CT scan showing small umbilical hernia.  Physical exam also consistent with umbilical hernia.  I have discussed with patient detail about the operation.  Risks, benefits and possible complications including but not limited to bleeding, infection mesh issues recurrence and bowel injuries.  HPI  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([place])    a.) mucinous adenocarcinoma (pathological stage Ia p T1a pN0 cM0); b.) s/p RUL lobectomy 11/04/2019   Aortic atherosclerosis (HCC)    Carotid artery stenosis    COPD (chronic obstructive pulmonary disease) (HCC)    Coronary artery disease    NIR stent to RCA   Diabetes mellitus without complication (HCC)    Diverticulosis    GERD (gastroesophageal reflux disease)    History of 2019 novel coronavirus disease (COVID-19) 06/17/2020   HLD (hyperlipidemia)    Hypertension    Hypokalemia    Implantable loop recorder present 09/30/2019   Left upper lobe pulmonary nodule 05/20/2020   a.) CT on 05/20/2020 --> new "tiny" pulmonary nodule; measured 4 mm.   Multiple thyroid nodules    Myocardial infarction ([place]) 09/03/1998   Renal cyst, right    a.) CT on 05/20/2020 - upper pole RIGHT kidney; measured 2.1 cm   S/P total thyroidectomy    Stroke ([place]) 2005   TIA   Vasovagal syncope     Past Surgical History:  Procedure Laterality Date   CAROTID STENT  07/18/2003   COLONOSCOPY     CORONARY ANGIOPLASTY WITH STENT PLACEMENT Left 09/03/1998   Procedure: CARDIAC CATHETERIZATION AND STENT PLACEMENT (NIR stent to RCA)   LOOP RECORDER INSERTION N/A 09/30/2019   LUMBAR FUSION N/A    L4-L5 SPINAL DECOMPRESSION AND FUSION; RODS PLACED   NECK SURGERY     THORACOTOMY/LOBECTOMY Right 11/04/2019   Procedure: THORACOTOMY/LOBECTOMY;  Surgeon: Nestor Lewandowsky, MD;  Location: [place]  [facility];  Service: Thoracic;  Laterality: Right;   THYROIDECTOMY N/A 02/25/2020   Procedure: THYROIDECTOMY, total;  Surgeon: [name], MD;  Location: [facility];  Service: General;  Laterality: N/A;  Provider requesting 2.5 hours/150 minutes for procedure.   TONSILLECTOMY     TUBAL LIGATION      Family History  Problem Relation Age of Onset   Alzheimer's disease Mother    Heart attack Father    Diabetes Mellitus II Brother    Diabetes Mellitus II Brother     Social History Social History   Tobacco Use   Smoking status: Former    Packs/day: 0.25    Years: 49.00    Pack years: 12.25    Types: Cigarettes    Quit date: 10/17/2019    Years since quitting: 0.9   Smokeless tobacco: Never   Tobacco comments:    1 pack lasts 3-4 days  Vaping Use   Vaping Use: Never used  Substance Use Topics   Alcohol use: Yes    Comment: 1 shot of vodka with lemonade every evening   Drug use: Never    Allergies  Allergen Reactions   Clopidogrel Anaphylaxis and Hives    Current Facility-Administered Medications  Medication Dose Route Frequency Provider Last Rate Last Admin   0.9 %  sodium chloride infusion   Intravenous Continuous Martha Clan, MD 10 mL/hr at 10/08/20 0816 New Bag at 10/08/20 0816   bupivacaine liposome (  EXPAREL) 1.3 % injection 266 mg  20 mL Infiltration Once [name], MD       ceFAZolin (ANCEF) 2-4 GM/100ML-% IVPB            ceFAZolin (ANCEF) IVPB 2g/100 mL premix  2 g Intravenous On Call to OR [name], MD       Chlorhexidine Gluconate Cloth 2 % PADS 6 each  6 each Topical Once [name], MD       And   Chlorhexidine Gluconate Cloth 2 % PADS 6 each  6 each Topical Once [name], MD       lactated ringers infusion   Intravenous Continuous Martha Clan, MD         Review of Systems Full ROS  was asked and was negative except for the information on the HPI  Physical Exam Blood pressure (!) 161/88, pulse 69, temperature 98 F (36.7  C), temperature source Oral, resp. rate 18, height 5\' 3"  (1.6 m), weight 68.9 kg, SpO2 98 %. CONSTITUTIONAL: NAD EYES: Pupils are equal, round, Sclera are non-icteric. EARS, NOSE, MOUTH AND THROAT: T Hearing is intact to voice. LYMPH NODES:  Lymph nodes in the neck are normal. RESPIRATORY:  Lungs are clear. There is normal respiratory effort, with equal breath sounds bilaterally, and without pathologic use of accessory muscles. CARDIOVASCULAR: Heart is regular without murmurs, gallops, or rubs. GI: The abdomen is soft, nontender, and nondistended. Reducible UH, small, tender.There are no palpable masses. There is no hepatosplenomegaly. There are normal bowel sounds iGU: Rectal deferred.   MUSCULOSKELETAL: Normal muscle strength and tone. No cyanosis or edema.   SKIN: Turgor is good and there are no pathologic skin lesions or ulcers. NEUROLOGIC: Motor and sensation is grossly normal. Cranial nerves are grossly intact. PSYCH:  Oriented to person, place and time. Affect is normal.  Data Reviewed  I have personally reviewed the patient's imaging, laboratory findings and medical records.    Assessment/Plan This a 65 year old female , patient was scheduled to undergo surgery by Dr. Celine Ahr, ( she is no longer with out practice) the pt wanted to keep her surgery date. She has an umbilical hernia that is symptomatic.  Discussed with patient detail about the procedure.  Risks, benefits and possible indications including but not limited to: Bleeding, infection, chronic pain recurrence.  Depending on operative findings we may decide to place mesh.   Caroleen Hamman, MD FACS General Surgeon 10/08/2020, 9:13 AM

## 2020-10-08 NOTE — Op Note (Signed)
Umbilical Hernia Repair with Mesh ventralex 4.3 cm  Pre-operative Diagnosis: umbilical hernia  Post-operative Diagnosis: same  Surgeon: Caroleen Hamman, MD FACS  Anesthesia: Gen. with endotracheal tube   Findings: 2.5* cm UH  Estimated Blood Loss: 5cc                 Specimens: sac          Complications: none              Procedure Details  The patient was seen again in the Holding Room. The benefits, complications, treatment options, and expected outcomes were discussed with the patient. The risks of bleeding, infection, recurrence of symptoms, failure to resolve symptoms, bowel injury, mesh placement, mesh infection, any of which could require further surgery were reviewed with the patient. The likelihood of improving the patient's symptoms with return to their baseline status is good.  The patient and/or family concurred with the proposed plan, giving informed consent.  The patient was taken to Operating Room, identified and the procedure verified.  A Time Out was held and the above information confirmed.  Prior to the induction of general anesthesia, antibiotic prophylaxis was administered. VTE prophylaxis was in place. General endotracheal anesthesia was then administered and tolerated well. After the induction, the abdomen was prepped with Chloraprep and draped in the sterile fashion. The patient was positioned in the supine position.  Incision was created with a scalpel over the hernia defect. Electrocautery was used to dissect through subcutaneous tissue, the hernia sac was opened excised.   The hernia was measured. A BARD 4.3cm Ventralex small patch selected and secure to the fascia. I closed the hernia defect with interrupted 0 Ethibond sutures.   Incision was closed in a 2 layer fashion with 3-0 Vicryl and 4-0 Monocryl. Dermabond was used to coat the skin. Liposomal marcaine was used at the  incision site and fascia. Patient tolerated procedure well and there were no  immediate complications. Needle and laparotomy counts were correct

## 2020-10-08 NOTE — Transfer of Care (Signed)
Immediate Anesthesia Transfer of Care Note  Patient: Ann [name]  Procedure(s) Performed: HERNIA REPAIR UMBILICAL ADULT, open INSERTION OF MESH  Patient Location: PACU  Anesthesia Type:General  Level of Consciousness: awake, alert  and oriented  Airway & Oxygen Therapy: Patient Spontanous Breathing and Patient connected to face mask oxygen  Post-op Assessment: Report given to RN and Post -op Vital signs reviewed and stable  Post vital signs: Reviewed and stable  Last Vitals:  Vitals Value Taken Time  BP 153/79 10/08/20 1015  Temp 36.2 C 10/08/20 1015  Pulse 76 10/08/20 1017  Resp 24 10/08/20 1017  SpO2 99 % 10/08/20 1017  Vitals shown include unvalidated device data.  Last Pain:  Vitals:   10/08/20 1015  TempSrc:   PainSc: 0-No pain         Complications: No notable events documented.

## 2020-10-08 NOTE — Anesthesia Procedure Notes (Signed)
Procedure Name: Intubation Date/Time: 10/08/2020 9:42 AM Performed by: Johney Maine, CRNA Pre-anesthesia Checklist: Patient identified, Patient being monitored, Timeout performed, Emergency Drugs available and Suction available Patient Re-evaluated:Patient Re-evaluated prior to induction Oxygen Delivery Method: Circle system utilized Preoxygenation: Pre-oxygenation with 100% oxygen Induction Type: IV induction Ventilation: Mask ventilation without difficulty Laryngoscope Size: Mac, 4 and McGraph (Grade IIa view, only posterior arytenoids visualized) Grade View: Grade II Tube type: Oral Tube size: 7.0 mm Number of attempts: 2 (first attempt was Grade III view with Miller 2) Airway Equipment and Method: Stylet Placement Confirmation: ETT inserted through vocal cords under direct vision, positive ETCO2 and breath sounds checked- equal and bilateral Secured at: 21 cm Tube secured with: Tape Dental Injury: Teeth and Oropharynx as per pre-operative assessment  Difficulty Due To: Difficult Airway- due to reduced neck mobility, Difficult Airway- due to anterior larynx and Difficult Airway- due to dentition

## 2020-10-11 ENCOUNTER — Encounter: Payer: Self-pay | Admitting: Surgery

## 2020-10-11 LAB — SURGICAL PATHOLOGY

## 2020-10-11 NOTE — Progress Notes (Signed)
Carelink Summary Report / Loop Recorder 

## 2020-10-19 ENCOUNTER — Encounter: Payer: Self-pay | Admitting: General Surgery

## 2020-10-22 ENCOUNTER — Encounter: Payer: Self-pay | Admitting: Physician Assistant

## 2020-10-22 ENCOUNTER — Ambulatory Visit (INDEPENDENT_AMBULATORY_CARE_PROVIDER_SITE_OTHER): Payer: Medicare Other | Admitting: Physician Assistant

## 2020-10-22 ENCOUNTER — Other Ambulatory Visit: Payer: Self-pay

## 2020-10-22 VITALS — BP 157/90 | HR 82 | Temp 98.7°F | Ht 62.5 in | Wt 149.4 lb

## 2020-10-22 DIAGNOSIS — K429 Umbilical hernia without obstruction or gangrene: Secondary | ICD-10-CM

## 2020-10-22 DIAGNOSIS — Z09 Encounter for follow-up examination after completed treatment for conditions other than malignant neoplasm: Secondary | ICD-10-CM

## 2020-10-22 NOTE — Progress Notes (Signed)
[facility] POST-OP OFFICE VISIT  10/22/2020  HPI: Ann [name] is a 65 y.o. female 14 days s/p umbilical hernia repair with Dr Dahlia Byes.   She has done very well since surgery No issues with pain, fever, nausea, emesis, or bowel changes Her incision is healing well She is ambulating without issues No other complaints  Vital signs: BP (!) 157/90   Pulse 82   Temp 98.7 F (37.1 C)   Ht 5' 2.5" (1.588 m)   Wt 149 lb 6.4 oz (67.8 kg)   SpO2 95%   BMI 26.89 kg/m    Physical Exam: Constitutional: Well appearing female, NAD Abdomen: Soft, non-tender, non-distended, no rebound/guarding Skin: Umbilical incision is healing well, dermabond is peeling off, no erythema or drainage  Assessment/Plan: This is a 65 y.o. female 14 days s/p umbilical hernia repair   - Pain control prn  - Reviewed wound care  - Reviewed lifting restrictions; recommend 6 weeks total  - She can follow up on as needed basis   -- Edison Simon, PA-C [facility] 10/22/2020, 11:40 AM [phone] M-F: 7am - 4pm

## 2020-10-22 NOTE — Patient Instructions (Addendum)
Follow-up with our office as needed.  Please call and ask to speak with a nurse if you develop questions or concerns.   The glue should continue to come off in the next few weeks.   GENERAL POST-OPERATIVE PATIENT INSTRUCTIONS   WOUND CARE INSTRUCTIONS: Try to keep the wound dry and avoid ointments on the wound unless directed to do so.  If the wound becomes bright red and painful or starts to drain infected material that is not clear, please contact your physician immediately.  If the wound is mildly pink and has a thick firm ridge underneath it, this is normal, and is referred to as a healing ridge.  This will resolve over the next 4-6 weeks.  BATHING: You may shower if you have been informed of this by your surgeon. You may bath in the tub once all of the glue comes off of your incisions.   DIET:  You may eat any foods that you can tolerate.  It is a good idea to eat a high fiber diet and take in plenty of fluids to prevent constipation.  If you do become constipated you may want to take a mild laxative or take ducolax tablets on a daily basis until your bowel habits are regular.  Constipation can be very uncomfortable, along with straining, after recent surgery.  ACTIVITY: You are encouraged to walk and engage in light activity for the next two weeks.  You should not lift more than 20 pounds for 6 weeks after surgery as it could put you at increased risk for complications.  Twenty pounds is roughly equivalent to a plastic bag of groceries. At that time- Listen to your body when lifting, if you have pain when lifting, stop and then try again in a few days. Soreness after doing exercises or activities of daily living is normal as you get back in to your normal routine.  MEDICATIONS:  Try to take narcotic medications and anti-inflammatory medications, such as tylenol, ibuprofen, naprosyn, etc., with food.  This will minimize stomach upset from the medication.  Should you develop nausea and  vomiting from the pain medication, or develop a rash, please discontinue the medication and contact your physician.  You should not drive, make important decisions, or operate machinery when taking narcotic pain medication.  SUNBLOCK Use sun block to incision area over the next year if this area will be exposed to sun. This helps decrease scarring and will allow you avoid a permanent darkened area over your incision.  QUESTIONS:  Please feel free to call our office if you have any questions, and we will be glad to assist you.

## 2020-11-07 LAB — CUP PACEART REMOTE DEVICE CHECK
Date Time Interrogation Session: 20221105081810
Implantable Pulse Generator Implant Date: 20210928

## 2020-11-08 ENCOUNTER — Ambulatory Visit (INDEPENDENT_AMBULATORY_CARE_PROVIDER_SITE_OTHER): Payer: Medicare Other

## 2020-11-08 DIAGNOSIS — I639 Cerebral infarction, unspecified: Secondary | ICD-10-CM | POA: Diagnosis not present

## 2020-11-11 NOTE — Progress Notes (Signed)
Carelink Summary Report / Loop Recorder 

## 2020-12-06 ENCOUNTER — Ambulatory Visit
Admission: RE | Admit: 2020-12-06 | Discharge: 2020-12-06 | Disposition: A | Discharge status: Home / Self Care | Payer: Medicare Other | Source: Ambulatory Visit | Attending: Oncology | Admitting: Oncology

## 2020-12-06 ENCOUNTER — Other Ambulatory Visit: Payer: Self-pay

## 2020-12-06 DIAGNOSIS — Z85118 Personal history of other malignant neoplasm of bronchus and lung: Secondary | ICD-10-CM | POA: Insufficient documentation

## 2020-12-06 DIAGNOSIS — Z08 Encounter for follow-up examination after completed treatment for malignant neoplasm: Secondary | ICD-10-CM | POA: Insufficient documentation

## 2020-12-08 ENCOUNTER — Encounter: Payer: Self-pay | Admitting: Oncology

## 2020-12-08 ENCOUNTER — Inpatient Hospital Stay: Payer: Medicare Other | Attending: Oncology | Admitting: Oncology

## 2020-12-08 ENCOUNTER — Other Ambulatory Visit: Payer: Self-pay

## 2020-12-08 VITALS — BP 177/90 | HR 72 | Temp 97.3°F | Resp 16 | Wt 153.7 lb

## 2020-12-08 DIAGNOSIS — Z85118 Personal history of other malignant neoplasm of bronchus and lung: Secondary | ICD-10-CM | POA: Diagnosis not present

## 2020-12-08 DIAGNOSIS — Z08 Encounter for follow-up examination after completed treatment for malignant neoplasm: Secondary | ICD-10-CM | POA: Diagnosis not present

## 2020-12-08 DIAGNOSIS — C3411 Malignant neoplasm of upper lobe, right bronchus or lung: Secondary | ICD-10-CM | POA: Diagnosis not present

## 2020-12-08 NOTE — Progress Notes (Signed)
Hematology/Oncology Consult note [facility]  Telephone:([phone] [phone]  Patient Care Team: Kirk Ruths, MD as PCP - General (Internal Medicine) Telford Nab, RN as Oncology Nurse Navigator Sindy Guadeloupe, MD as Consulting Physician (Oncology) Nestor Lewandowsky, MD (Inactive) as Referring Physician (Cardiothoracic Surgery)   Name of the patient: Roxan Yamamoto  [id number]  April 07, 1955   Date of visit: 12/08/20  Diagnosis- history of stage I lung cancer s/p surgery  Chief complaint/ Reason for visit-discuss CT scan results and further management  Heme/Onc history: Patient is a 65 year old female with a past medical history significant for tobacco dependence, peripheral vascular disease and coronary artery disease.  She underwent CT lung cancer screening protocol in September 2021 which showed a suspicious right upper lobe lung nodule 10.3 mm in size.  This was followed by a PET scan which showed hypermetabolic spiculated right upper lobe lung nodule with an SUV of 7 T1 8 with no evidence of locoregional adenopathy or distant metastatic disease.  Incidental finding of an enlarged right lobe of the thyroid gland.  Intense metabolic activity at the level of distal rectum/anus favored physiologic   Patient was evaluated by Dr. 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  No evidence of visceral pleural invasion.  There was another tumor also in the upper lobe of the lung 0.8 cm which was adenocarcinoma in situ nonmucinous.  Margins negative lymph node sampling of 7 nodes did not show any evidence of malignancy pT1b pN0   Patient had pleural effusion post surgery which was tapped and was negative for malignancy.  Interval history-patient reports doing well and denies any specific complaints at this time.  Appetite and weight have  remained stable.  Denies any cough or shortness of breath  ECOG PS- 1 Pain scale- 0   Review of systems- Review of Systems  Constitutional:  Negative for chills, fever, malaise/fatigue and weight loss.  HENT:  Negative for congestion, ear discharge and nosebleeds.   Eyes:  Negative for blurred vision.  Respiratory:  Negative for cough, hemoptysis, sputum production, shortness of breath and wheezing.   Cardiovascular:  Negative for chest pain, palpitations, orthopnea and claudication.  Gastrointestinal:  Negative for abdominal pain, blood in stool, constipation, diarrhea, heartburn, melena, nausea and vomiting.  Genitourinary:  Negative for dysuria, flank pain, frequency, hematuria and urgency.  Musculoskeletal:  Negative for back pain, joint pain and myalgias.  Skin:  Negative for rash.  Neurological:  Negative for dizziness, tingling, focal weakness, seizures, weakness and headaches.  Endo/Heme/Allergies:  Does not bruise/bleed easily.  Psychiatric/Behavioral:  Negative for depression and suicidal ideas. The patient does not have insomnia.      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   Multiple thyroid nodules    Myocardial  infarction ([place]) 09/03/1998   Renal cyst, right    a.) CT on 05/20/2020 - upper pole RIGHT kidney; measured 2.1 cm   S/P total thyroidectomy    Stroke ([place]) 2005   TIA   Vasovagal syncope      Past Surgical History:  Procedure Laterality Date   CAROTID STENT  07/18/2003   COLONOSCOPY     CORONARY ANGIOPLASTY WITH STENT PLACEMENT Left 09/03/1998   Procedure: CARDIAC CATHETERIZATION AND STENT PLACEMENT (NIR stent to RCA)   INSERTION OF MESH  10/08/2020   Procedure: INSERTION OF MESH;  Surgeon: Jules Husbands, MD;  Location: [facility] ORS;  Service: General;;   LOOP RECORDER INSERTION N/A 09/30/2019   LUMBAR FUSION N/A    L4-L5 SPINAL DECOMPRESSION AND FUSION; RODS PLACED   NECK SURGERY     THORACOTOMY/LOBECTOMY Right 11/04/2019   Procedure: THORACOTOMY/LOBECTOMY;  Surgeon: Nestor Lewandowsky, MD;  Location: [facility] ORS;  Service: Thoracic;  Laterality: Right;   THYROIDECTOMY N/A 02/25/2020   Procedure: THYROIDECTOMY, total;  Surgeon: [name], MD;  Location: [facility] ORS;  Service: General;  Laterality: N/A;  Provider requesting 2.5 hours/150 minutes for procedure.   TONSILLECTOMY     TUBAL LIGATION     UMBILICAL HERNIA REPAIR N/A 10/08/2020   Procedure: HERNIA REPAIR UMBILICAL ADULT, open;  Surgeon: Jules Husbands, MD;  Location: [facility] ORS;  Service: General;  Laterality: N/A;    Social History   Socioeconomic History   Marital status: Married    Spouse name: Not on file   Number of children: Not on file   Years of education: Not on file   Highest education level: Not on file  Occupational History   Not on file  Tobacco Use   Smoking status: Former    Packs/day: 0.25    Years: 49.00    Pack years: 12.25    Types: Cigarettes    Quit date: 10/17/2019    Years since quitting: 1.1   Smokeless tobacco: Never   Tobacco comments:    1 pack lasts 3-4 days  Vaping Use   Vaping Use: Never used  Substance and Sexual Activity   Alcohol use: Yes    Comment: 1 shot of vodka with  lemonade every evening   Drug use: Never   Sexual activity: Not on file  Other Topics Concern   Not on file  Social History Narrative   Live at home with hubby   Social Determinants of Health   Financial Resource Strain: Not on file  Food Insecurity: Not on file  Transportation Needs: Not on file  Physical Activity: Not on file  Stress: Not on file  Social Connections: Not on file  Intimate Partner Violence: Not on file    Family History  Problem Relation Age of Onset   Alzheimer's disease Mother    Heart attack Father    Diabetes Mellitus II Brother    Diabetes Mellitus II Brother      Current Outpatient Medications:    albuterol (VENTOLIN HFA) 108 (90 Base) MCG/ACT inhaler, Inhale 1-2 puffs into the lungs every 6 (six) hours as needed for wheezing or shortness of breath., Disp: , Rfl:    aspirin 325 MG tablet, Take 325 mg by mouth in the morning., Disp: , Rfl:    furosemide (  LASIX) 20 MG tablet, Take 20 mg by mouth in the morning., Disp: , Rfl:    levothyroxine (SYNTHROID) 88 MCG tablet, Take 1 tablet (88 mcg total) by mouth daily at 6 (six) AM., Disp: 30 tablet, Rfl: 3   metoprolol tartrate (LOPRESSOR) 25 MG tablet, Take 25 mg by mouth 2 (two) times daily., Disp: , Rfl:    pantoprazole (PROTONIX) 40 MG tablet, Take 40 mg by mouth daily., Disp: , Rfl:    potassium chloride SA (KLOR-CON) 20 MEQ tablet, Take 20 mEq by mouth in the morning., Disp: , Rfl:    rosuvastatin (CRESTOR) 20 MG tablet, Take 20 mg by mouth at bedtime., Disp: , Rfl:    celecoxib (CELEBREX) 100 MG capsule, Take 100 mg by mouth in the morning. (Patient not taking: Reported on 12/08/2020), Disp: , Rfl:    fluticasone (FLONASE) 50 MCG/ACT nasal spray, Place into the nose. (Patient not taking: Reported on 12/08/2020), Disp: , Rfl:    phentermine (ADIPEX-P) 37.5 MG tablet, Take 37.5 mg by mouth daily before breakfast. (Patient not taking: Reported on 12/08/2020), Disp: , Rfl:   Physical exam:  Vitals:    12/08/20 1316  BP: (!) 177/90  Pulse: 72  Resp: 16  Temp: (!) 97.3 F (36.3 C)  SpO2: 96%  Weight: 153 lb 11.2 oz (69.7 kg)   Physical Exam Constitutional:      General: She is not in acute distress. Cardiovascular:     Rate and Rhythm: Normal rate and regular rhythm.     Heart sounds: Normal heart sounds.  Pulmonary:     Effort: Pulmonary effort is normal.     Breath sounds: Normal breath sounds.  Skin:    General: Skin is warm and dry.  Neurological:     Mental Status: She is alert and oriented to person, place, and time.     CMP Latest Ref Rng & Units 10/08/2020  Glucose 70 - 99 mg/dL 127(H)  BUN 8 - 23 mg/dL 10  Creatinine 0.44 - 1.00 mg/dL 0.60  Sodium 135 - 145 mmol/L 141  Potassium 3.5 - 5.1 mmol/L 3.8  Chloride 98 - 111 mmol/L 103  CO2 22 - 32 mmol/L -  Calcium 8.9 - 10.3 mg/dL -  Total Protein 6.5 - 8.1 g/dL -  Total Bilirubin 0.3 - 1.2 mg/dL -  Alkaline Phos 38 - 126 U/L -  AST 15 - 41 U/L -  ALT 0 - 44 U/L -   CBC Latest Ref Rng & Units 10/08/2020  WBC 4.0 - 10.5 K/uL -  Hemoglobin 12.0 - 15.0 g/dL 16.0(H)  Hematocrit 36.0 - 46.0 % 47.0(H)  Platelets 150 - 400 K/uL -    No images are attached to the encounter.  CT Chest Wo Contrast  Result Date: 12/07/2020 CLINICAL DATA:  Non-small-cell lung cancer. Restaging. Status post right upper lobectomy. EXAM: CT CHEST WITHOUT CONTRAST TECHNIQUE: Multidetector CT imaging of the chest was performed following the standard protocol without IV contrast. COMPARISON:  05/20/2020 FINDINGS: Cardiovascular: The heart size is normal. No substantial pericardial effusion. Coronary artery calcification is evident. Mild atherosclerotic calcification is noted in the wall of the thoracic aorta. Mediastinum/Nodes: No mediastinal lymphadenopathy. No evidence for gross hilar lymphadenopathy although assessment is limited by the lack of intravenous contrast on the current study. The esophagus has normal imaging features. There is no  axillary lymphadenopathy. Lungs/Pleura: Centrilobular emphsyema noted. Volume loss right hemithorax compatible with prior right upper lobectomy. Small focus of bandlike consolidative opacity in the anterior  right lower lobe (image 85/3) is likely chronic atelectasis or scarring. Similar probable atelectasis noted lateral right costophrenic sulcus. 4 mm peripheral left upper lobe pulmonary nodule identified as new on the previous exam has resolved in the interval. 2 mm left upper lobe nodule on 65/3 is unchanged. No new suspicious pulmonary nodule or mass. No pleural effusion. Upper Abdomen: Exophytic cyst noted upper pole right kidney, similar to prior Musculoskeletal: Similar nonspecific sclerosis lateral left sixth rib. Otherwise no suspicious lytic or sclerotic osseous abnormality evident. IMPRESSION: 1. Status post right upper lobectomy. No new or progressive findings to suggest recurrent or metastatic disease. 2. Interval resolution of the 4 mm peripheral left upper lobe pulmonary nodule seen as new on the previous exam. No new suspicious pulmonary nodule or mass. 3. Aortic Atherosclerosis (ICD10-I70.0) and Emphysema (ICD10-J43.9). Electronically Signed   By: Misty Stanley M.D.   On: 12/07/2020 15:16     Assessment and plan- Patient is a 66 y.o. female with stage I right upper lobe lung cancer s/p lobectomy here to discuss CT scan results and further management  I have reviewed CT chest images independently and discussed findings with the patient.  Overall there are no concerningFindings of new or progressive disease.  Stable surgical changes in the right upper lobe.  Resolution of her left upper lobe 4 mm lesion.  She will continue surveillance scans every 6 months and I will see her thereafter.  After 3 years of surveillance scan we will switch to yearly imaging   Visit Diagnosis 1. Encounter for follow-up surveillance of lung cancer   2. Malignant neoplasm of upper lobe of right lung [facility])       Dr. Randa Evens, MD, MPH [facility] [phone] 12/08/2020 4:22 PM

## 2020-12-09 LAB — CUP PACEART REMOTE DEVICE CHECK
Date Time Interrogation Session: 20221208081954
Implantable Pulse Generator Implant Date: 20210928

## 2020-12-10 ENCOUNTER — Ambulatory Visit: Payer: Medicare Other | Admitting: Oncology

## 2020-12-13 ENCOUNTER — Ambulatory Visit (INDEPENDENT_AMBULATORY_CARE_PROVIDER_SITE_OTHER): Payer: Medicare Other

## 2020-12-13 DIAGNOSIS — R55 Syncope and collapse: Secondary | ICD-10-CM | POA: Diagnosis not present

## 2020-12-20 NOTE — Progress Notes (Signed)
Carelink Summary Report / Loop Recorder 

## 2021-01-17 ENCOUNTER — Ambulatory Visit (INDEPENDENT_AMBULATORY_CARE_PROVIDER_SITE_OTHER): Payer: Medicare Other

## 2021-01-17 DIAGNOSIS — R55 Syncope and collapse: Secondary | ICD-10-CM

## 2021-01-17 LAB — CUP PACEART REMOTE DEVICE CHECK
Date Time Interrogation Session: 20230115231251
Implantable Pulse Generator Implant Date: 20210928

## 2021-01-25 ENCOUNTER — Emergency Department: Payer: Medicare Other

## 2021-01-25 ENCOUNTER — Other Ambulatory Visit: Payer: Self-pay

## 2021-01-25 ENCOUNTER — Encounter: Payer: Self-pay | Admitting: Emergency Medicine

## 2021-01-25 ENCOUNTER — Emergency Department
Admission: EM | Admit: 2021-01-25 | Discharge: 2021-01-25 | Disposition: A | Discharge status: Home / Self Care | Payer: Medicare Other | Attending: Emergency Medicine | Admitting: Emergency Medicine

## 2021-01-25 DIAGNOSIS — Z85118 Personal history of other malignant neoplasm of bronchus and lung: Secondary | ICD-10-CM | POA: Diagnosis not present

## 2021-01-25 DIAGNOSIS — R1031 Right lower quadrant pain: Secondary | ICD-10-CM | POA: Diagnosis not present

## 2021-01-25 DIAGNOSIS — R0602 Shortness of breath: Secondary | ICD-10-CM | POA: Insufficient documentation

## 2021-01-25 DIAGNOSIS — N83201 Unspecified ovarian cyst, right side: Secondary | ICD-10-CM

## 2021-01-25 DIAGNOSIS — I6522 Occlusion and stenosis of left carotid artery: Secondary | ICD-10-CM

## 2021-01-25 DIAGNOSIS — R1030 Lower abdominal pain, unspecified: Secondary | ICD-10-CM | POA: Diagnosis present

## 2021-01-25 DIAGNOSIS — R109 Unspecified abdominal pain: Secondary | ICD-10-CM

## 2021-01-25 DIAGNOSIS — Z20822 Contact with and (suspected) exposure to covid-19: Secondary | ICD-10-CM | POA: Insufficient documentation

## 2021-01-25 LAB — CBC
HCT: 42.7 % (ref 36.0–46.0)
Hemoglobin: 13.8 g/dL (ref 12.0–15.0)
MCH: 29.7 pg (ref 26.0–34.0)
MCHC: 32.3 g/dL (ref 30.0–36.0)
MCV: 92 fL (ref 80.0–100.0)
Platelets: 246 10*3/uL (ref 150–400)
RBC: 4.64 MIL/uL (ref 3.87–5.11)
RDW: 15.4 % (ref 11.5–15.5)
WBC: 5.8 10*3/uL (ref 4.0–10.5)
nRBC: 0 % (ref 0.0–0.2)

## 2021-01-25 LAB — URINALYSIS, ROUTINE W REFLEX MICROSCOPIC
Bilirubin Urine: NEGATIVE
Glucose, UA: NEGATIVE mg/dL
Ketones, ur: NEGATIVE mg/dL
Nitrite: NEGATIVE
Protein, ur: 30 mg/dL — AB
Specific Gravity, Urine: 1.02 (ref 1.005–1.030)
pH: 5.5 (ref 5.0–8.0)

## 2021-01-25 LAB — COMPREHENSIVE METABOLIC PANEL
ALT: 16 U/L (ref 0–44)
AST: 16 U/L (ref 15–41)
Albumin: 3.9 g/dL (ref 3.5–5.0)
Alkaline Phosphatase: 152 U/L — ABNORMAL HIGH (ref 38–126)
Anion gap: 7 (ref 5–15)
BUN: 10 mg/dL (ref 8–23)
CO2: 27 mmol/L (ref 22–32)
Calcium: 8.9 mg/dL (ref 8.9–10.3)
Chloride: 104 mmol/L (ref 98–111)
Creatinine, Ser: 0.71 mg/dL (ref 0.44–1.00)
GFR, Estimated: >60 mL/min (ref >60)
Glucose, Bld: 143 mg/dL — ABNORMAL HIGH (ref 70–99)
Potassium: 3.6 mmol/L (ref 3.5–5.1)
Sodium: 138 mmol/L (ref 135–145)
Total Bilirubin: 0.4 mg/dL (ref 0.3–1.2)
Total Protein: 7.5 g/dL (ref 6.5–8.1)

## 2021-01-25 LAB — RESP PANEL BY RT-PCR (FLU A&B, COVID) ARPGX2
Influenza A by PCR: NEGATIVE
Influenza B by PCR: NEGATIVE
SARS Coronavirus 2 by RT PCR: NEGATIVE

## 2021-01-25 LAB — TROPONIN I (HIGH SENSITIVITY)
Troponin I (High Sensitivity): 13 ng/L (ref <18)
Troponin I (High Sensitivity): 16 ng/L (ref <18)

## 2021-01-25 LAB — LIPASE, BLOOD: Lipase: 28 U/L (ref 11–51)

## 2021-01-25 MED ORDER — ONDANSETRON HCL 4 MG/2ML IJ SOLN
4.0000 mg | Freq: Once | INTRAMUSCULAR | Status: AC
  Administered 2021-01-25: 14:00:00 4 mg via INTRAVENOUS
  Filled 2021-01-25: qty 2

## 2021-01-25 MED ORDER — ACETAMINOPHEN 500 MG PO TABS: 1000.0000 mg | ORAL_TABLET | Freq: Once | ORAL | Status: DC

## 2021-01-25 MED ORDER — OXYCODONE-ACETAMINOPHEN 5-325 MG PO TABS: 1.0000 | ORAL_TABLET | Freq: Three times a day (TID) | ORAL | 0 refills | Status: AC | PRN

## 2021-01-25 MED ORDER — HYDROMORPHONE HCL 1 MG/ML IJ SOLN
0.5000 mg | Freq: Once | INTRAMUSCULAR | Status: AC
  Administered 2021-01-25: 17:00:00 0.5 mg via INTRAVENOUS
  Filled 2021-01-25: qty 1

## 2021-01-25 MED ORDER — IOHEXOL 350 MG/ML SOLN
75.0000 mL | Freq: Once | INTRAVENOUS | Status: AC | PRN
  Administered 2021-01-25: 15:00:00 75 mL via INTRAVENOUS
  Filled 2021-01-25: qty 75

## 2021-01-25 MED ORDER — OXYCODONE-ACETAMINOPHEN 5-325 MG PO TABS
1.0000 | ORAL_TABLET | Freq: Once | ORAL | Status: AC
  Administered 2021-01-25: 22:00:00 1 via ORAL
  Filled 2021-01-25: qty 1

## 2021-01-25 MED ORDER — FENTANYL CITRATE PF 50 MCG/ML IJ SOSY
50.0000 ug | PREFILLED_SYRINGE | Freq: Once | INTRAMUSCULAR | Status: AC
  Administered 2021-01-25: 14:00:00 50 ug via INTRAVENOUS
  Filled 2021-01-25: qty 1

## 2021-01-25 MED ORDER — OXYCODONE-ACETAMINOPHEN 5-325 MG PO TABS
1.0000 | ORAL_TABLET | Freq: Once | ORAL | Status: AC
  Administered 2021-01-25: 18:00:00 1 via ORAL
  Filled 2021-01-25: qty 1

## 2021-01-25 NOTE — ED Triage Notes (Signed)
Pt to ED via POV with c/o RLQ and right sided pain that radiates to her back, she took Tylenol yesterday but she is not able to take the pain. Denies any pain during urination

## 2021-01-25 NOTE — ED Provider Notes (Signed)
I sent care of this patient approximately 1500.  Please see outgoing vita's note for full details guarding patient's initial evaluation assessment.  In brief patient is presenting for evaluation of acute right lower quadrant abdominal pain beginning yesterday associate with some chest tightness and shortness of breath.  EKG and nonelevated troponin x2 are not suggestive of ACS.  COVID influenza PCR is negative.  UA not suggestive of cystitis with contamination from squamous epithelial cells and patient denying any urinary symptoms.  CBC without leukocytosis or acute anemia.  Lipase not consistent with acute pancreatitis.  CT chest abdomen pelvis reviewed by myself shows no evidence of PE, pneumonia or other clear acute thoracic process.  There is stable tortuosity and ectasia of the aorta and occlusion of left common carotid as well as CAD.  I think this occlusion is likely chronic as patient denies any neck pain headache and has no new weakness numbness tingling vision changes vertigo or any neurologic symptoms or other symptoms other than in the right abdomen a little bit in the substernal area.  In addition she is completely nonfocal neuro exam as documented below.  Cranial nerves II through XII grossly intact.  No pronator drift.  No finger dysmetria.  Symmetric 5/5 strength of all extremities.  Sensation intact to light touch in all extremities.  Unremarkable unassisted gait.  I think she is stable from this perspective follow-up outpatient.  In the abdomen pelvis patient is known to have bilateral cysts and some fatty infiltration of the descending colon from prior possible IBD but no acute process such as appendicitis, diverticulitis, kidney stone or any other clear explanation for right lower quadrant pain.  I also reviewed radiology's interpretation of findings.  Ultrasound ordered by outgoing provider shows cysts of the right ovary largest up to 4.2 cm.  I discussed this with on-call OB/GYN Dr.  Leafy Ro who also spoke with the reading radiologist and both felt that this was likely a simple cyst.  No evidence of torsion.  I certainly think this could be the cause of patient's pain as there is no other clear source of possible pain on her CT in this area.  Her pain is much better controlled on my reassessment and while I considered observation or admission for pain control given she feels it is much more tolerable now I think she is stable for discharge with outpatient PCP OB/GYN and vascular surgery follow-up.  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  She also follow-up with her PCP.  Rx written for analgesia.  Discharged in stable condition.   Lucrezia Starch, MD 01/25/21 [phone]

## 2021-01-25 NOTE — ED Provider Notes (Addendum)
[facility] Provider Note    Event Date/Time   First MD Initiated Contact with Patient 01/25/21 1337     (approximate)   History   Abdominal Pain   HPI  Ann [name] is a 66 y.o. female with diabetes, prior hernia repair, prior cancers who comes in with right sided pain.  Reports taking Tylenol yesterday but still having the pain.  She denies any urinary symptoms.  Denies any issues with bowel movements.  Patient reports the pain is in her lower abdomen.  She also reports new shortness of breath over the past few days.  She states that she noted that it hurts to breathe.  Denies a history of blood clots but has had multiple cancers.  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  She states that she tried to take some of your albuterol without relief in symptoms.  Reports just a chest heaviness to her as well.     Physical Exam   Triage Vital Signs: ED Triage Vitals [01/25/21 1225]  Enc Vitals Group     BP (!) 178/106     Pulse Rate 71     Resp 20     Temp 98.5 F (36.9 C)     Temp Source Oral     SpO2 99 %     Weight 156 lb (70.8 kg)     Height 5' 3"  (1.6 m)     Head Circumference      Peak Flow      Pain Score 10     Pain Loc      Pain Edu?      Excl. in [place]?     Most recent vital signs: Vitals:   01/25/21 1225 01/25/21 1332  BP: (!) 178/106 (!) 177/86  Pulse: 71 75  Resp: 20 19  Temp: 98.5 F (36.9 C)   SpO2: 99% 99%     General: Awake, no distress.  Appears uncomfortable CV:  Good peripheral perfusion.  Resp:  Normal effort.  Abd:  No distention.  Tender in the right lower quadrant Other:  NO Leg swelling   ED Results / Procedures / Treatments   Labs (all labs ordered are listed, but only abnormal results are displayed) Labs Reviewed  COMPREHENSIVE METABOLIC PANEL - Abnormal; Notable for the following components:      Result Value   Glucose, Bld 143 (*)    Alkaline Phosphatase 152 (*)    All other components  within normal limits  URINALYSIS, ROUTINE W REFLEX MICROSCOPIC - Abnormal; Notable for the following components:   APPearance CLEAR (*)    Hgb urine dipstick TRACE (*)    Protein, ur 30 (*)    Leukocytes,Ua TRACE (*)    Bacteria, UA RARE (*)    All other components within normal limits  LIPASE, BLOOD  CBC     EKG  My interpretation of EKG:  Normal sinus rate of 71 without any ST elevation or T wave inversions with type I AV block  RADIOLOGY I have reviewed the CT personally but pending the results from rads    PROCEDURES:  Critical Care performed: No  Procedures   MEDICATIONS ORDERED IN ED: Medications  fentaNYL (SUBLIMAZE) injection 50 mcg (50 mcg Intravenous Given 01/25/21 1405)  ondansetron (ZOFRAN) injection 4 mg (4 mg Intravenous Given 01/25/21 1404)  iohexol (OMNIPAQUE) 350 MG/ML injection 75 mL (75 mLs Intravenous Contrast Given 01/25/21 1437)     IMPRESSION / MDM / ASSESSMENT AND  PLAN / ED COURSE  I reviewed the triage vital signs and the nursing notes.   Patient comes in with shortness of breath, abdominal pain.  We will get CT scans to evaluate for PE given patient is a history of cancer.  We will also get CT abdomen to evaluate for any obstruction, appendicitis, diverticulitis or other acute pathology.  IV was placed to give some IV fentanyl and IV Zofran to help with symptoms  UA difficult to interpret due to significant squamous cells but unlikely to represent UTI Lipase is normal no signs of pancreatitis CMP with normal kidney function slightly elevated alk phos CBC no anemia no white count Initial troponin was negative but will get repeat  Patient handed off to oncoming team pending CT imaging, repeat troponin, COVID, CT reads.  CT reads concerning for cyst.  I added on ultrasound.  There is also concern for carotid artery occlusion.  I did try to update patient on this but she is not in the room and I will be leaving.  I did talk to the patient's  family member in the room about it and have placed a number for vascular surgery for follow-up and gave a copy of report     FINAL CLINICAL IMPRESSION(S) / ED DIAGNOSES   Final diagnoses:  Abdominal pain, unspecified abdominal location     Rx / DC Orders   ED Discharge Orders     None        Note:  This document was prepared using Dragon voice recognition software and may include unintentional dictation errors.   [name], MD 01/25/21 1514    Vanessa , MD 01/25/21 [phone]

## 2021-01-25 NOTE — Discharge Instructions (Addendum)
IMPRESSION:  1. No acute pulmonary embolism.  2. Stable tortuosity, ectasia and calcification of the thoracic  aorta but no focal aneurysm or dissection.  3. 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  4. Stable advanced three-vessel coronary artery calcifications.  5. Stable surgical changes involving the right lung.  6. No acute abdominal/pelvic findings.  7. Bilateral ovarian cysts. Follow-up ultrasound examination in 6-12  months is recommended.  8. Fibrofatty infiltrative type changes involving the ascending  colon typically seen with prior inflammatory bowel disease.

## 2021-01-25 NOTE — ED Notes (Signed)
Pt at [facility]/S

## 2021-01-25 NOTE — ED Provider Triage Note (Signed)
Emergency Medicine Provider Triage Evaluation Note  BEVAN VU, a 66 y.o. female  was evaluated in triage.  Pt complains of right lower quadrant pain, and right chest pain.  She reports onset of symptoms yesterday without provocation.  He denies any associated nausea, vomiting, diarrhea, or dysuria.  Review of Systems  Positive: RLQ abd pain, right chest pain  Negative: NVD  Physical Exam  BP (!) 178/106 (BP Location: Left Arm)    Pulse 71    Temp 98.5 F (36.9 C) (Oral)    Resp 20    Ht 5\' 3"  (1.6 m)    Wt 70.8 kg    SpO2 99%    BMI 27.63 kg/m  Gen:   Awake, no distress  uncomfortable Resp:  Normal effort  MSK:   Moves extremities without difficulty  Other:  ABD: mildly tender to palp over RLQ  Medical Decision Making  Medically screening exam initiated at 12:31 PM.  Appropriate orders placed.  Minal A Calaway was informed that the remainder of the evaluation will be completed by another provider, this initial triage assessment does not replace that evaluation, and the importance of remaining in the ED until their evaluation is complete.  Patient ED evaluation of right lower quad abdominal pain which some right-sided chest wall pain with onset yesterday.  Patient denies any associated nausea, vomiting, or diarrhea.   Melvenia Needles, PA-C 01/25/21 1233

## 2021-01-25 NOTE — ED Notes (Signed)
Pt presents to ED with c/o of generalized ABD pain. Pt states pain is more located near the RLQ region, pt denies any appendectomy int he past. Pt denies N/V/D. Pt denies urinary symptoms. Pt states this started 24 hours ago. Pt states normal BM pattern.   Pt is A&Ox4.

## 2021-01-26 NOTE — Progress Notes (Signed)
Carelink Summary Report / Loop Recorder 

## 2021-01-31 DIAGNOSIS — I6521 Occlusion and stenosis of right carotid artery: Secondary | ICD-10-CM | POA: Insufficient documentation

## 2021-02-19 LAB — CUP PACEART REMOTE DEVICE CHECK
Date Time Interrogation Session: 20230217230206
Implantable Pulse Generator Implant Date: 20210928

## 2021-02-21 ENCOUNTER — Other Ambulatory Visit (INDEPENDENT_AMBULATORY_CARE_PROVIDER_SITE_OTHER): Payer: Self-pay | Admitting: Nurse Practitioner

## 2021-02-21 ENCOUNTER — Ambulatory Visit (INDEPENDENT_AMBULATORY_CARE_PROVIDER_SITE_OTHER): Payer: Medicare Other | Admitting: Vascular Surgery

## 2021-02-21 ENCOUNTER — Other Ambulatory Visit: Payer: Self-pay

## 2021-02-21 ENCOUNTER — Ambulatory Visit (INDEPENDENT_AMBULATORY_CARE_PROVIDER_SITE_OTHER): Payer: Medicare Other

## 2021-02-21 ENCOUNTER — Encounter (INDEPENDENT_AMBULATORY_CARE_PROVIDER_SITE_OTHER): Payer: Medicare Other | Admitting: Vascular Surgery

## 2021-02-21 VITALS — BP 136/85 | HR 74 | Ht 62.0 in | Wt 154.0 lb

## 2021-02-21 DIAGNOSIS — I2510 Atherosclerotic heart disease of native coronary artery without angina pectoris: Secondary | ICD-10-CM | POA: Diagnosis not present

## 2021-02-21 DIAGNOSIS — R55 Syncope and collapse: Secondary | ICD-10-CM

## 2021-02-21 DIAGNOSIS — I10 Essential (primary) hypertension: Secondary | ICD-10-CM

## 2021-02-21 DIAGNOSIS — I6523 Occlusion and stenosis of bilateral carotid arteries: Secondary | ICD-10-CM

## 2021-02-21 DIAGNOSIS — J449 Chronic obstructive pulmonary disease, unspecified: Secondary | ICD-10-CM

## 2021-02-21 DIAGNOSIS — I6529 Occlusion and stenosis of unspecified carotid artery: Secondary | ICD-10-CM | POA: Diagnosis not present

## 2021-02-21 DIAGNOSIS — I6522 Occlusion and stenosis of left carotid artery: Secondary | ICD-10-CM

## 2021-02-21 DIAGNOSIS — E785 Hyperlipidemia, unspecified: Secondary | ICD-10-CM

## 2021-02-21 NOTE — Progress Notes (Signed)
MRN : [id number]  Ann [name] is a 66 y.o. (Oct 13, 1955) female who presents with chief complaint of check carotid arteries.  History of Present Illness:  The patient is seen for follow up evaluation of carotid stenosis. The carotid stenosis followed by ultrasound.   The patient denies amaurosis fugax. There is no recent history of TIA symptoms or focal motor deficits. There is no prior documented CVA.  The patient is taking enteric-coated aspirin 81 mg daily.  There is no history of migraine headaches. There is no history of seizures.  The patient has a history of coronary artery disease, no recent episodes of angina or shortness of breath. The patient denies PAD or claudication symptoms. There is a history of hyperlipidemia which is being treated with a statin.    Duplex ultrasound shows <40% RICA and occlusion (known) of the LICA.  No change compared to last study.   No outpatient medications have been marked as taking for the 02/21/21 encounter (Appointment) with Delana Meyer, Dolores Lory, MD.    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   Multiple thyroid nodules    Myocardial infarction ([place]) 09/03/1998   Renal cyst, right    a.) CT on 05/20/2020 - upper pole RIGHT kidney; measured 2.1 cm    S/P total thyroidectomy    Stroke ([place]) 2005   TIA   Vasovagal syncope     Past Surgical History:  Procedure Laterality Date   CAROTID STENT  07/18/2003   COLONOSCOPY     CORONARY ANGIOPLASTY WITH STENT PLACEMENT Left 09/03/1998   Procedure: CARDIAC CATHETERIZATION AND STENT PLACEMENT (NIR stent to RCA)   INSERTION OF MESH  10/08/2020   Procedure: INSERTION OF MESH;  Surgeon: Jules Husbands, MD;  Location: [facility] ORS;  Service: General;;   LOOP RECORDER INSERTION N/A 09/30/2019   LUMBAR FUSION N/A    L4-L5 SPINAL DECOMPRESSION AND FUSION; RODS PLACED   NECK SURGERY     THORACOTOMY/LOBECTOMY Right 11/04/2019   Procedure: THORACOTOMY/LOBECTOMY;  Surgeon: Nestor Lewandowsky, MD;  Location: [facility] ORS;  Service: Thoracic;  Laterality: Right;   THYROIDECTOMY N/A 02/25/2020   Procedure: THYROIDECTOMY, total;  Surgeon: [name], MD;  Location: [facility] ORS;  Service: General;  Laterality: N/A;  Provider requesting 2.5 hours/150 minutes for procedure.   TONSILLECTOMY     TUBAL LIGATION     UMBILICAL HERNIA REPAIR N/A 10/08/2020   Procedure: HERNIA REPAIR UMBILICAL ADULT, open;  Surgeon: Jules Husbands, MD;  Location: [facility] ORS;  Service: General;  Laterality: N/A;    Social History Social History   Tobacco Use   Smoking status: Former    Packs/day: 0.25    Years: 49.00    Pack years: 12.25    Types: Cigarettes  Quit date: 10/17/2019    Years since quitting: 1.3   Smokeless tobacco: Never   Tobacco comments:    1 pack lasts 3-4 days  Vaping Use   Vaping Use: Never used  Substance Use Topics   Alcohol use: Yes    Comment: 1 shot of vodka with lemonade every evening   Drug use: Never    Family History Family History  Problem Relation Age of Onset   Alzheimer's disease Mother    Heart attack Father    Diabetes Mellitus II Brother    Diabetes Mellitus II Brother     Allergies  Allergen Reactions   Clopidogrel Anaphylaxis and Hives     REVIEW OF SYSTEMS (Negative unless  checked)  Constitutional: [] Weight loss  [] Fever  [] Chills Cardiac: [] Chest pain   [] Chest pressure   [] Palpitations   [] Shortness of breath when laying flat   [] Shortness of breath with exertion. Vascular:  [] Pain in legs with walking   [] Pain in legs at rest  [] History of DVT   [] Phlebitis   [] Swelling in legs   [] Varicose veins   [] Non-healing ulcers Pulmonary:   [] Uses home oxygen   [] Productive cough   [] Hemoptysis   [] Wheeze  [] COPD   [] Asthma Neurologic:  [] Dizziness   [] Seizures   [] History of stroke   [] History of TIA  [] Aphasia   [] Vissual changes   [] Weakness or numbness in arm   [] Weakness or numbness in leg Musculoskeletal:   [] Joint swelling   [] Joint pain   [] Low back pain Hematologic:  [] Easy bruising  [] Easy bleeding   [] Hypercoagulable state   [] Anemic Gastrointestinal:  [] Diarrhea   [] Vomiting  [] Gastroesophageal reflux/heartburn   [] Difficulty swallowing. Genitourinary:  [] Chronic kidney disease   [] Difficult urination  [] Frequent urination   [] Blood in urine Skin:  [] Rashes   [] Ulcers  Psychological:  [] History of anxiety   []  History of major depression.  Physical Examination  There were no vitals filed for this visit. There is no height or weight on file to calculate BMI. Gen: WD/WN, NAD Head: [place]/AT, No temporalis wasting.  Ear/Nose/Throat: Hearing grossly intact, nares w/o erythema or drainage Eyes: PER, EOMI, sclera nonicteric.  Neck: Supple, no masses.  No bruit or JVD.  Pulmonary:  Good air movement, no audible wheezing, no use of accessory muscles.  Cardiac: RRR, normal S1, S2, no Murmurs. Vascular:  no carotid bruits noted Vessel Right Left  Radial Palpable Palpable  Carotid Palpable Palpable  Gastrointestinal: soft, non-distended. No guarding/no peritoneal signs.  Musculoskeletal: M/S 5/5 throughout.  No visible deformity.  Neurologic: CN 2-12 intact. Pain and light touch intact in extremities.  Symmetrical.  Speech is fluent. Motor exam as listed  above. Psychiatric: Judgment intact, Mood & affect appropriate for pt's clinical situation. Dermatologic: No rashes or ulcers noted.  No changes consistent with cellulitis.   CBC Lab Results  Component Value Date   WBC 5.8 01/25/2021   HGB 13.8 01/25/2021   HCT 42.7 01/25/2021   MCV 92.0 01/25/2021   PLT 246 01/25/2021    BMET    Component Value Date/Time   NA 138 01/25/2021 1230   K 3.6 01/25/2021 1230   CL 104 01/25/2021 1230   CO2 27 01/25/2021 1230   GLUCOSE 143 (H) 01/25/2021 1230   BUN 10 01/25/2021 1230   CREATININE 0.71 01/25/2021 1230   CALCIUM 8.9 01/25/2021 1230   GFRNONAA >60 01/25/2021 1230   GFRAA >60 09/18/2019 1031   CrCl cannot be calculated (Patient's most recent lab  result is older than the maximum 21 days allowed.).  COAG Lab Results  Component Value Date   INR 0.9 10/31/2019    Radiology CT Angio Chest PE W and/or Wo Contrast  Result Date: 01/25/2021 CLINICAL DATA:  Chest and abdominal pain. EXAM: CT ANGIOGRAPHY CHEST CT ABDOMEN AND PELVIS WITH CONTRAST TECHNIQUE: Multidetector CT imaging of the chest was performed using the standard protocol during bolus administration of intravenous contrast. Multiplanar CT image reconstructions and MIPs were obtained to evaluate the vascular anatomy. Multidetector CT imaging of the abdomen and pelvis was performed using the standard protocol during bolus administration of intravenous contrast. RADIATION DOSE REDUCTION: This exam was performed according to the departmental dose-optimization program which includes automated exposure control, adjustment of the mA and/or kV according to patient size and/or use of iterative reconstruction technique. CONTRAST:  88mL OMNIPAQUE IOHEXOL 350 MG/ML SOLN COMPARISON:  Chest CT 12/06/2020 FINDINGS: CTA CHEST FINDINGS Cardiovascular: The heart is normal in size. No pericardial effusion. Stable tortuosity, ectasia and calcification of the thoracic aorta but no dissection or focal  aneurysm. Stable advanced three-vessel coronary artery calcifications. The left carotid artery is occluded proximally. The pulmonary arterial tree is well opacified. No filling defects to suggest pulmonary embolism. Mediastinum/Nodes: No mediastinal or hilar mass or adenopathy. The esophagus is grossly normal. Lungs/Pleura: Mild emphysematous changes but no acute pulmonary findings. No pulmonary contusion, pneumothorax or pleural effusion. No worrisome pulmonary lesions or pulmonary nodules. Stable surgical changes involving the right lung. Musculoskeletal: No acute fracture of the sternum, thoracic vertebral bodies or ribs. Review of the MIP images confirms the above findings. CT ABDOMEN and PELVIS FINDINGS Hepatobiliary: No acute hepatic injury or perihepatic fluid collection. No hepatic lesions or intrahepatic biliary dilatation. The gallbladder is normal. Normal caliber common bile duct. Pancreas: No mass, inflammation or ductal dilatation. Spleen: Normal size.  No focal lesions. Adrenals/Urinary Tract: Adrenal glands are normal. Right renal cysts but no worrisome renal lesions or hydronephrosis. The bladder is unremarkable. Stomach/Bowel: The stomach, duodenum, small bowel and colon are unremarkable. No acute inflammatory process, mass lesions or obstructive findings. Fibrofatty infiltrative type changes involving the ascending colon typically seen with prior inflammatory bowel disease. The terminal ileum is normal. The appendix is normal. Vascular/Lymphatic: Age advanced atherosclerotic calcifications involving the aorta and iliac arteries but no aneurysm or dissection. The major venous structures are patent. No mesenteric or retroperitoneal mass or adenopathy. Reproductive: Bilateral ovarian cysts are noted. There are 2 cysts on the right side. The larger more inferior and posterior cyst measures 3.7 cm and the smaller more anterior cyst measures 2.7 cm. The left renal cyst measures 2.3 cm. Recommend  follow-up US in 6-12 months. Note: This recommendation does not apply to premenarchal patients and to those with increased risk (genetic, family history, elevated tumor markers or other high-risk factors) of ovarian cancer. Reference: JACR 2020 Feb; 17(2):248-254 Other: No pelvic mass or adenopathy. No free pelvic fluid collections. No inguinal mass or adenopathy. No abdominal wall hernia or subcutaneous lesions. Musculoskeletal: No significant bony findings. Postoperative changes involving the lumbar spine. IMPRESSION: 1. No acute pulmonary embolism. 2. Stable tortuosity, ectasia and calcification of the thoracic aorta but no focal aneurysm or dissection. 3. 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 4. Stable advanced three-vessel coronary artery calcifications. 5. Stable surgical changes involving the right lung. 6. No acute abdominal/pelvic findings. 7. Bilateral ovarian cysts. Follow-up ultrasound examination in 6-12 months is recommended. 8. Fibrofatty infiltrative type changes involving the ascending colon typically seen  with prior inflammatory bowel disease. Aortic Atherosclerosis (ICD10-I70.0) and Emphysema (ICD10-J43.9). Electronically Signed   By: Marijo Sanes M.D.   On: 01/25/2021 15:17   CT ABDOMEN PELVIS W CONTRAST  Result Date: 01/25/2021 CLINICAL DATA:  Chest and abdominal pain. EXAM: CT ANGIOGRAPHY CHEST CT ABDOMEN AND PELVIS WITH CONTRAST TECHNIQUE: Multidetector CT imaging of the chest was performed using the standard protocol during bolus administration of intravenous contrast. Multiplanar CT image reconstructions and MIPs were obtained to evaluate the vascular anatomy. Multidetector CT imaging of the abdomen and pelvis was performed using the standard protocol during bolus administration of intravenous contrast. RADIATION DOSE REDUCTION: This exam was performed according to the departmental dose-optimization program which includes automated exposure control, adjustment of the mA  and/or kV according to patient size and/or use of iterative reconstruction technique. CONTRAST:  32mL OMNIPAQUE IOHEXOL 350 MG/ML SOLN COMPARISON:  Chest CT 12/06/2020 FINDINGS: CTA CHEST FINDINGS Cardiovascular: The heart is normal in size. No pericardial effusion. Stable tortuosity, ectasia and calcification of the thoracic aorta but no dissection or focal aneurysm. Stable advanced three-vessel coronary artery calcifications. The left carotid artery is occluded proximally. The pulmonary arterial tree is well opacified. No filling defects to suggest pulmonary embolism. Mediastinum/Nodes: No mediastinal or hilar mass or adenopathy. The esophagus is grossly normal. Lungs/Pleura: Mild emphysematous changes but no acute pulmonary findings. No pulmonary contusion, pneumothorax or pleural effusion. No worrisome pulmonary lesions or pulmonary nodules. Stable surgical changes involving the right lung. Musculoskeletal: No acute fracture of the sternum, thoracic vertebral bodies or ribs. Review of the MIP images confirms the above findings. CT ABDOMEN and PELVIS FINDINGS Hepatobiliary: No acute hepatic injury or perihepatic fluid collection. No hepatic lesions or intrahepatic biliary dilatation. The gallbladder is normal. Normal caliber common bile duct. Pancreas: No mass, inflammation or ductal dilatation. Spleen: Normal size.  No focal lesions. Adrenals/Urinary Tract: Adrenal glands are normal. Right renal cysts but no worrisome renal lesions or hydronephrosis. The bladder is unremarkable. Stomach/Bowel: The stomach, duodenum, small bowel and colon are unremarkable. No acute inflammatory process, mass lesions or obstructive findings. Fibrofatty infiltrative type changes involving the ascending colon typically seen with prior inflammatory bowel disease. The terminal ileum is normal. The appendix is normal. Vascular/Lymphatic: Age advanced atherosclerotic calcifications involving the aorta and iliac arteries but no aneurysm  or dissection. The major venous structures are patent. No mesenteric or retroperitoneal mass or adenopathy. Reproductive: Bilateral ovarian cysts are noted. There are 2 cysts on the right side. The larger more inferior and posterior cyst measures 3.7 cm and the smaller more anterior cyst measures 2.7 cm. The left renal cyst measures 2.3 cm. Recommend follow-up US in 6-12 months. Note: This recommendation does not apply to premenarchal patients and to those with increased risk (genetic, family history, elevated tumor markers or other high-risk factors) of ovarian cancer. Reference: JACR 2020 Feb; 17(2):248-254 Other: No pelvic mass or adenopathy. No free pelvic fluid collections. No inguinal mass or adenopathy. No abdominal wall hernia or subcutaneous lesions. Musculoskeletal: No significant bony findings. Postoperative changes involving the lumbar spine. IMPRESSION: 1. No acute pulmonary embolism. 2. Stable tortuosity, ectasia and calcification of the thoracic aorta but no focal aneurysm or dissection. 3. 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 4. Stable advanced three-vessel coronary artery calcifications. 5. Stable surgical changes involving the right lung. 6. No acute abdominal/pelvic findings. 7. Bilateral ovarian cysts. Follow-up ultrasound examination in 6-12 months is recommended. 8. Fibrofatty infiltrative type changes involving the ascending colon typically seen with prior  inflammatory bowel disease. Aortic Atherosclerosis (ICD10-I70.0) and Emphysema (ICD10-J43.9). Electronically Signed   By: Marijo Sanes M.D.   On: 01/25/2021 15:17   CUP PACEART REMOTE DEVICE CHECK  Result Date: 02/19/2021 ILR summary report received. Battery status OK. Normal device function. No new symptom, tachy, brady, or pause episodes. No new AF episodes. Monthly summary reports and ROV/PRN Kathy Breach, RN, CCDS, [employer]  US PELVIC COMPLETE W TRANSVAGINAL AND TORSION R/O  Result Date:  01/25/2021 CLINICAL DATA:  Right lower quadrant pain EXAM: TRANSABDOMINAL AND TRANSVAGINAL ULTRASOUND OF PELVIS DOPPLER ULTRASOUND OF OVARIES TECHNIQUE: Both transabdominal and transvaginal ultrasound examinations of the pelvis were performed. Transabdominal technique was performed for global imaging of the pelvis including uterus, ovaries, adnexal regions, and pelvic cul-de-sac. It was necessary to proceed with endovaginal exam following the transabdominal exam to visualize the uterus, endometrium, ovaries and adnexa. Color and duplex Doppler ultrasound was utilized to evaluate blood flow to the ovaries. COMPARISON:  None. FINDINGS: Uterus Measurements: 5.7 x 2.9 x 4.2 cm = volume: 37 mL. No fibroids or other mass visualized. Endometrium Thickness: 6 mm in thickness.  No focal abnormality visualized. Right ovary Measurements: 8.1 x 2.3 x 1.9 cm = volume: 18 mL. Exophytic cysts off the right ovary, the largest measuring 4.2 x 3.7 x 3.0 cm Left ovary Measurements: 2.7 x 1.3 x 1.4 cm = volume: 2.6 mL. Normal appearance/no adnexal mass. Other findings No abnormal free fluid. Pulsed Doppler evaluation of both ovaries demonstrates normal low-resistance arterial and venous waveforms. IMPRESSION: Exophytic lesions/cysts off the right ovary, the largest measuring up to 4.2 cm in appearing simple. Recommend followup US in 3-6 months. Note: This recommendation does not apply to premenarchal patients or to those with increased risk (genetic, family history, elevated tumor markers or other high-risk factors) of ovarian cancer. Reference: Radiology 2019 Nov; 293(2):359-371. No evidence of ovarian torsion. Electronically Signed   By: Rolm Baptise M.D.   On: 01/25/2021 17:16     Assessment/Plan 1. Bilateral carotid artery stenosis Recommend:  Given the patient's asymptomatic subcritical stenosis no further invasive testing or surgery at this time.  Duplex ultrasound shows <40% RICA and occlusion (known) of the  LICA.  Continue antiplatelet therapy as prescribed Continue management of CAD, HTN and Hyperlipidemia Healthy heart diet,  encouraged exercise at least 4 times per week Follow up in 12 months with duplex ultrasound and physical exam.    - VAS US CAROTID; Future  2. Coronary artery disease involving native coronary artery of native heart without angina pectoris Continue cardiac and antihypertensive medications as already ordered and reviewed, no changes at this time.  Continue statin as ordered and reviewed, no changes at this time  Nitrates PRN for chest pain   3. HTN, goal below 140/90 Continue antihypertensive medications as already ordered, these medications have been reviewed and there are no changes at this time.   4. Chronic obstructive pulmonary disease, unspecified COPD type ([place]) Continue pulmonary medications and aerosols as already ordered, these medications have been reviewed and there are no changes at this time.    5. Hyperlipidemia, unspecified hyperlipidemia type Continue statin as ordered and reviewed, no changes at this time     Hortencia Pilar, MD  02/21/2021 2:48 PM

## 2021-02-23 ENCOUNTER — Encounter (INDEPENDENT_AMBULATORY_CARE_PROVIDER_SITE_OTHER): Payer: Self-pay | Admitting: Vascular Surgery

## 2021-02-23 DIAGNOSIS — I6529 Occlusion and stenosis of unspecified carotid artery: Secondary | ICD-10-CM | POA: Insufficient documentation

## 2021-02-24 NOTE — Progress Notes (Signed)
Carelink Summary Report / Loop Recorder 

## 2021-03-28 ENCOUNTER — Ambulatory Visit (INDEPENDENT_AMBULATORY_CARE_PROVIDER_SITE_OTHER): Payer: Medicare Other

## 2021-03-28 DIAGNOSIS — R55 Syncope and collapse: Secondary | ICD-10-CM | POA: Diagnosis not present

## 2021-03-29 LAB — CUP PACEART REMOTE DEVICE CHECK
Date Time Interrogation Session: 20230324230757
Implantable Pulse Generator Implant Date: 20210928

## 2021-04-06 NOTE — Progress Notes (Signed)
Carelink Summary Report / Loop Recorder 

## 2021-04-28 ENCOUNTER — Ambulatory Visit (INDEPENDENT_AMBULATORY_CARE_PROVIDER_SITE_OTHER): Payer: Medicare Other

## 2021-04-28 DIAGNOSIS — R55 Syncope and collapse: Secondary | ICD-10-CM | POA: Diagnosis not present

## 2021-04-28 LAB — CUP PACEART REMOTE DEVICE CHECK
Date Time Interrogation Session: 20230426230721
Implantable Pulse Generator Implant Date: 20210928

## 2021-05-13 NOTE — Progress Notes (Signed)
Carelink Summary Report / Loop Recorder 

## 2021-05-31 ENCOUNTER — Ambulatory Visit (INDEPENDENT_AMBULATORY_CARE_PROVIDER_SITE_OTHER): Payer: Medicare Other

## 2021-05-31 DIAGNOSIS — R55 Syncope and collapse: Secondary | ICD-10-CM

## 2021-05-31 LAB — CUP PACEART REMOTE DEVICE CHECK
Date Time Interrogation Session: 20230529231340
Implantable Pulse Generator Implant Date: 20210928

## 2021-06-08 ENCOUNTER — Ambulatory Visit
Admission: RE | Admit: 2021-06-08 | Discharge: 2021-06-08 | Disposition: A | Discharge status: Home / Self Care | Payer: Medicare Other | Source: Ambulatory Visit | Attending: Oncology | Admitting: Oncology

## 2021-06-08 DIAGNOSIS — C3411 Malignant neoplasm of upper lobe, right bronchus or lung: Secondary | ICD-10-CM | POA: Diagnosis not present

## 2021-06-10 ENCOUNTER — Ambulatory Visit: Payer: Medicare Other | Admitting: Oncology

## 2021-06-10 ENCOUNTER — Inpatient Hospital Stay: Payer: Medicare Other | Attending: Oncology | Admitting: Medical Oncology

## 2021-06-10 VITALS — BP 126/86 | HR 78 | Temp 97.7°F | Resp 16 | Wt 148.0 lb

## 2021-06-10 DIAGNOSIS — C3411 Malignant neoplasm of upper lobe, right bronchus or lung: Secondary | ICD-10-CM

## 2021-06-10 DIAGNOSIS — R911 Solitary pulmonary nodule: Secondary | ICD-10-CM | POA: Insufficient documentation

## 2021-06-10 DIAGNOSIS — Z08 Encounter for follow-up examination after completed treatment for malignant neoplasm: Secondary | ICD-10-CM | POA: Insufficient documentation

## 2021-06-10 DIAGNOSIS — Z85118 Personal history of other malignant neoplasm of bronchus and lung: Secondary | ICD-10-CM | POA: Insufficient documentation

## 2021-06-10 NOTE — Progress Notes (Signed)
Returns for follow-up and results from her CT scan. No new symptoms or concerns.

## 2021-06-10 NOTE — Progress Notes (Signed)
Hematology/Oncology Consult note [facility]  Telephone:([phone] [phone]  Patient Care Team: Kirk Ruths, MD as PCP - General (Internal Medicine) Telford Nab, RN as Oncology Nurse Navigator Sindy Guadeloupe, MD as Consulting Physician (Oncology) Nestor Lewandowsky, MD (Inactive) as Referring Physician (Cardiothoracic Surgery)   Name of the patient: Ann [name]  [id number]  12-Mar-1955   Date of visit: 06/10/21  Diagnosis- history of stage I lung cancer s/p surgery  Chief complaint/ Reason for visit-discuss CT scan results and further management  Heme/Onc history: Patient is a 66 year old female with a past medical history significant for tobacco dependence, peripheral vascular disease and coronary artery disease.  She underwent CT lung cancer screening protocol in September 2021 which showed a suspicious right upper lobe lung nodule 10.3 mm in size.  This was followed by a PET scan which showed hypermetabolic spiculated right upper lobe lung nodule with an SUV of 7 T1 8 with no evidence of locoregional adenopathy or distant metastatic disease.  Incidental finding of an enlarged right lobe of the thyroid gland.  Intense metabolic activity at the level of distal rectum/anus favored physiologic   Patient was evaluated by Dr. 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  No evidence of visceral pleural invasion.  There was another tumor also in the upper lobe of the lung 0.8 cm which was adenocarcinoma in situ nonmucinous.  Margins negative lymph node sampling of 7 nodes did not show any evidence of malignancy pT1b pN0   Patient had pleural effusion post surgery which was tapped and was negative for malignancy.  Interval history-patient presents with her husband.  Patient states that she is doing really well.  She recently had a  chest CT scan completed on Wednesday.  She denies any shortness of breath, chest pain, hemoptysis.  She states that she has chronic night sweats which she attributes due to "the change ".  No unintentional weight loss.  ECOG PS- 1 Pain scale- 0   Review of systems- Review of Systems  Constitutional:  Negative for chills, fever, malaise/fatigue and weight loss.  HENT:  Negative for congestion, ear discharge and nosebleeds.   Eyes:  Negative for blurred vision.  Respiratory:  Negative for cough, hemoptysis, sputum production, shortness of breath and wheezing.   Cardiovascular:  Negative for chest pain, palpitations, orthopnea and claudication.  Gastrointestinal:  Negative for abdominal pain, blood in stool, constipation, diarrhea, heartburn, melena, nausea and vomiting.  Genitourinary:  Negative for dysuria, flank pain, frequency, hematuria and urgency.  Musculoskeletal:  Negative for back pain, joint pain and myalgias.  Skin:  Negative for rash.  Neurological:  Negative for dizziness, tingling, focal weakness, seizures, weakness and headaches.  Endo/Heme/Allergies:  Does not bruise/bleed easily.  Psychiatric/Behavioral:  Negative for depression and suicidal ideas. The patient does not have insomnia.       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   Multiple thyroid nodules    Myocardial infarction ([place]) 09/03/1998   Renal cyst, right    a.) CT on 05/20/2020 - upper pole RIGHT kidney; measured 2.1 cm   S/P total thyroidectomy    Stroke ([place]) 2005   TIA   Vasovagal syncope      Past Surgical History:  Procedure Laterality Date   CAROTID STENT  07/18/2003   COLONOSCOPY     CORONARY ANGIOPLASTY WITH STENT PLACEMENT Left 09/03/1998   Procedure: CARDIAC CATHETERIZATION AND STENT PLACEMENT (NIR stent to RCA)   INSERTION OF MESH  10/08/2020   Procedure: INSERTION OF MESH;  Surgeon: Jules Husbands, MD;  Location: [facility] ORS;  Service: General;;   LOOP RECORDER INSERTION N/A 09/30/2019   LUMBAR FUSION N/A    L4-L5 SPINAL DECOMPRESSION AND FUSION; RODS PLACED   NECK SURGERY     THORACOTOMY/LOBECTOMY Right 11/04/2019   Procedure: THORACOTOMY/LOBECTOMY;  Surgeon: Nestor Lewandowsky, MD;  Location: [facility] ORS;  Service: Thoracic;  Laterality: Right;   THYROIDECTOMY N/A 02/25/2020   Procedure: THYROIDECTOMY, total;  Surgeon: [name], MD;  Location: [facility] ORS;  Service: General;  Laterality: N/A;  Provider requesting 2.5 hours/150 minutes for procedure.   TONSILLECTOMY     TUBAL LIGATION     UMBILICAL HERNIA REPAIR N/A 10/08/2020   Procedure: HERNIA REPAIR UMBILICAL ADULT, open;  Surgeon: Jules Husbands, MD;  Location: [facility] ORS;  Service: General;  Laterality: N/A;    Social History   Socioeconomic History   Marital status: Married    Spouse name: Not on file   Number of children: Not on file   Years of education: Not on file   Highest education level: Not on file  Occupational History   Not on file  Tobacco Use   Smoking status: Former    Packs/day: 0.25    Years: 49.00    Total pack years: 12.25    Types: Cigarettes    Quit date: 10/17/2019     Years since quitting: 1.6   Smokeless tobacco: Never   Tobacco comments:    1 pack lasts 3-4 days  Vaping Use   Vaping Use: Never used  Substance and Sexual Activity   Alcohol use: Yes    Comment: 1 shot of vodka with lemonade every evening   Drug use: Never   Sexual activity: Not on file  Other Topics Concern   Not on file  Social History Narrative   Live at home with hubby   Social Determinants of Health   Financial Resource Strain: Not on file  Food Insecurity: Not on file  Transportation Needs: Not on file  Physical Activity: Not on file  Stress: Not on file  Social Connections: Not on file  Intimate Partner Violence: Not on file    Family History  Problem Relation Age of Onset   Alzheimer's disease Mother    Heart attack Father    Diabetes Mellitus II Brother    Diabetes Mellitus II Brother      Current Outpatient Medications:    albuterol (VENTOLIN HFA) 108 (90  Base) MCG/ACT inhaler, Inhale 1-2 puffs into the lungs every 6 (six) hours as needed for wheezing or shortness of breath., Disp: , Rfl:    aspirin 325 MG tablet, Take 325 mg by mouth in the morning., Disp: , Rfl:    fluticasone (FLONASE) 50 MCG/ACT nasal spray, Place into the nose., Disp: , Rfl:    furosemide (LASIX) 20 MG tablet, Take 20 mg by mouth in the morning., Disp: , Rfl:    levothyroxine (SYNTHROID) 88 MCG tablet, Take 1 tablet (88 mcg total) by mouth daily at 6 (six) AM., Disp: 30 tablet, Rfl: 3   metoprolol tartrate (LOPRESSOR) 25 MG tablet, Take 25 mg by mouth 2 (two) times daily., Disp: , Rfl:    pantoprazole (PROTONIX) 40 MG tablet, Take 40 mg by mouth daily., Disp: , Rfl:    potassium chloride SA (KLOR-CON) 20 MEQ tablet, Take 20 mEq by mouth in the morning., Disp: , Rfl:    rosuvastatin (CRESTOR) 20 MG tablet, Take 20 mg by mouth at bedtime., Disp: , Rfl:    celecoxib (CELEBREX) 100 MG capsule, Take 100 mg by mouth in the morning. (Patient not taking: Reported on 12/08/2020), Disp: , Rfl:     phentermine (ADIPEX-P) 37.5 MG tablet, Take 37.5 mg by mouth daily before breakfast. (Patient not taking: Reported on 12/08/2020), Disp: , Rfl:   Physical exam:  Vitals:   06/10/21 1339 06/10/21 1342  BP:  126/86  Pulse:  78  Resp:  16  Temp:  97.7 F (36.5 C)  TempSrc:  Tympanic  SpO2:  97%  Weight: 148 lb (67.1 kg)    Physical Exam Constitutional:      General: She is not in acute distress. Cardiovascular:     Rate and Rhythm: Normal rate and regular rhythm.     Heart sounds: Normal heart sounds.  Pulmonary:     Effort: Pulmonary effort is normal.     Breath sounds: Normal breath sounds.  Skin:    General: Skin is warm and dry.  Neurological:     Mental Status: She is alert and oriented to person, place, and time.         Latest Ref Rng & Units 01/25/2021   12:30 PM  CMP  Glucose 70 - 99 mg/dL 143   BUN 8 - 23 mg/dL 10   Creatinine 0.44 - 1.00 mg/dL 0.71   Sodium 135 - 145 mmol/L 138   Potassium 3.5 - 5.1 mmol/L 3.6   Chloride 98 - 111 mmol/L 104   CO2 22 - 32 mmol/L 27   Calcium 8.9 - 10.3 mg/dL 8.9   Total Protein 6.5 - 8.1 g/dL 7.5   Total Bilirubin 0.3 - 1.2 mg/dL 0.4   Alkaline Phos 38 - 126 U/L 152   AST 15 - 41 U/L 16   ALT 0 - 44 U/L 16       Latest Ref Rng & Units 01/25/2021   12:30 PM  CBC  WBC 4.0 - 10.5 K/uL 5.8   Hemoglobin 12.0 - 15.0 g/dL 13.8   Hematocrit 36.0 - 46.0 % 42.7   Platelets 150 - 400 K/uL 246     No images are attached to the encounter.  CT Chest Wo Contrast  Result Date: 06/09/2021 CLINICAL DATA:  Non-small cell lung cancer restaging. * Tracking Code: BO * EXAM: CT CHEST WITHOUT CONTRAST TECHNIQUE: Multidetector CT imaging of the chest was performed following the standard protocol without IV contrast. RADIATION DOSE REDUCTION: This exam was performed  according to the departmental dose-optimization program which includes automated exposure control, adjustment of the mA and/or kV according to patient size and/or use of iterative  reconstruction technique. COMPARISON:  None Available. FINDINGS: Cardiovascular: Coronary artery calcification and aortic atherosclerotic calcification. Mediastinum/Nodes: No axillary or supraclavicular adenopathy. No mediastinal or hilar adenopathy. No pericardial fluid. Esophagus normal. Lungs/Pleura: Surgical scar along the RIGHT upper lobectomy. No nodularity. RIGHT lower lobe clear. 4 mm nodule in the LEFT upper lobe (image 63/3) compares to 1 mm on prior. Upper Abdomen: Limited view of the liver, kidneys, pancreas are unremarkable. Normal adrenal glands. Musculoskeletal: No aggressive osseous lesion. IMPRESSION: 1. Stable postoperative change in the RIGHT lung. 2. Small LEFT upper lobe nodule is larger/more conspicuous. Recommend follow-up on routine surveillance. Electronically Signed   By: Suzy Bouchard M.D.   On: 06/09/2021 13:06   CUP PACEART REMOTE DEVICE CHECK  Result Date: 05/31/2021 ILR summary report received. Battery status OK. Normal device function. No new symptom, tachy, brady, or pause episodes. No new AF episodes. Monthly summary reports and ROV/PRN LA    Assessment and plan- Patient is a 66 y.o. female with stage I right upper lobe lung cancer s/p lobectomy here to discuss CT scan results and further management  Patient I reviewed her recent CT scan.  The area in the right lung postresection looks good and stable.  In the left upper lobe there is a 4 mm nodule which might be the same nodule that was seen last May which was also 4 mm.  This nodule resolved down to 1 mm on her last scan.  I discussed this with patient.  At this time we have elected to continue monitoring this lesion to see if it improves as it has previous or if it grows.  Discussed red flag signs and symptoms.  May be inflammatory in nature.  She denies any recent chest colds or recent smoking use.   Visit Diagnosis 1. Encounter for follow-up surveillance of lung cancer   2. Malignant neoplasm of upper lobe of  right lung ([place])      Hughie Closs PA-C  [place] [facility] 06/10/2021 1:59 PM

## 2021-06-15 DIAGNOSIS — C3411 Malignant neoplasm of upper lobe, right bronchus or lung: Secondary | ICD-10-CM

## 2021-06-15 NOTE — Progress Notes (Signed)
Survivorship Care Plan visit completed.  Treatment summary reviewed and given to patient.  ASCO answers booklet reviewed and given to patient.  CARE program and Cancer Transitions discussed with patient along with other resources cancer center offers to patients and caregivers.  Patient verbalized understanding.    

## 2021-06-15 NOTE — Progress Notes (Signed)
Carelink Summary Report / Loop Recorder 

## 2021-07-03 LAB — CUP PACEART REMOTE DEVICE CHECK
Date Time Interrogation Session: 20230701230849
Implantable Pulse Generator Implant Date: 20210928

## 2021-07-04 ENCOUNTER — Ambulatory Visit (INDEPENDENT_AMBULATORY_CARE_PROVIDER_SITE_OTHER): Payer: Medicare Other

## 2021-07-04 DIAGNOSIS — R55 Syncope and collapse: Secondary | ICD-10-CM

## 2021-07-28 NOTE — Progress Notes (Signed)
Carelink Summary Report / Loop Recorder 

## 2021-08-05 LAB — CUP PACEART REMOTE DEVICE CHECK
Date Time Interrogation Session: 20230803230835
Implantable Pulse Generator Implant Date: 20210928

## 2021-08-08 ENCOUNTER — Ambulatory Visit (INDEPENDENT_AMBULATORY_CARE_PROVIDER_SITE_OTHER): Payer: Medicare Other

## 2021-08-08 DIAGNOSIS — I639 Cerebral infarction, unspecified: Secondary | ICD-10-CM

## 2021-09-07 LAB — CUP PACEART REMOTE DEVICE CHECK
Date Time Interrogation Session: 20230905231138
Implantable Pulse Generator Implant Date: 20210928

## 2021-09-07 NOTE — Progress Notes (Signed)
Carelink Summary Report / Loop Recorder 

## 2021-09-12 ENCOUNTER — Ambulatory Visit (INDEPENDENT_AMBULATORY_CARE_PROVIDER_SITE_OTHER): Payer: Medicare Other

## 2021-09-12 DIAGNOSIS — I639 Cerebral infarction, unspecified: Secondary | ICD-10-CM | POA: Diagnosis not present

## 2021-09-13 ENCOUNTER — Other Ambulatory Visit: Payer: Self-pay | Admitting: Internal Medicine

## 2021-09-13 DIAGNOSIS — Z1231 Encounter for screening mammogram for malignant neoplasm of breast: Secondary | ICD-10-CM

## 2021-09-17 ENCOUNTER — Emergency Department
Admission: EM | Admit: 2021-09-17 | Discharge: 2021-09-17 | Disposition: A | Discharge status: Home / Self Care | Payer: Medicare Other | Attending: Emergency Medicine | Admitting: Emergency Medicine

## 2021-09-17 ENCOUNTER — Other Ambulatory Visit: Payer: Self-pay

## 2021-09-17 DIAGNOSIS — Z85118 Personal history of other malignant neoplasm of bronchus and lung: Secondary | ICD-10-CM | POA: Insufficient documentation

## 2021-09-17 DIAGNOSIS — I2510 Atherosclerotic heart disease of native coronary artery without angina pectoris: Secondary | ICD-10-CM | POA: Diagnosis not present

## 2021-09-17 DIAGNOSIS — R109 Unspecified abdominal pain: Secondary | ICD-10-CM | POA: Insufficient documentation

## 2021-09-17 DIAGNOSIS — J449 Chronic obstructive pulmonary disease, unspecified: Secondary | ICD-10-CM | POA: Diagnosis not present

## 2021-09-17 DIAGNOSIS — B029 Zoster without complications: Secondary | ICD-10-CM | POA: Insufficient documentation

## 2021-09-17 DIAGNOSIS — I10 Essential (primary) hypertension: Secondary | ICD-10-CM | POA: Diagnosis not present

## 2021-09-17 DIAGNOSIS — Z8616 Personal history of COVID-19: Secondary | ICD-10-CM | POA: Diagnosis not present

## 2021-09-17 DIAGNOSIS — R21 Rash and other nonspecific skin eruption: Secondary | ICD-10-CM | POA: Diagnosis present

## 2021-09-17 DIAGNOSIS — E119 Type 2 diabetes mellitus without complications: Secondary | ICD-10-CM | POA: Insufficient documentation

## 2021-09-17 MED ORDER — VALACYCLOVIR HCL 1 G PO TABS: 1000.0000 mg | ORAL_TABLET | Freq: Three times a day (TID) | ORAL | 0 refills | Status: AC

## 2021-09-17 MED ORDER — LIDOCAINE 5 % EX PTCH
1.0000 | MEDICATED_PATCH | CUTANEOUS | Status: DC
  Administered 2021-09-17: 1 via TRANSDERMAL
  Filled 2021-09-17: qty 1

## 2021-09-17 MED ORDER — LIDOCAINE 5 % EX PTCH: 1.0000 | MEDICATED_PATCH | CUTANEOUS | 0 refills | Status: DC

## 2021-09-17 MED ORDER — VALACYCLOVIR HCL 500 MG PO TABS
1000.0000 mg | ORAL_TABLET | Freq: Once | ORAL | Status: AC
  Administered 2021-09-17: 1000 mg via ORAL
  Filled 2021-09-17: qty 2

## 2021-09-17 MED ORDER — IBUPROFEN 400 MG PO TABS
400.0000 mg | ORAL_TABLET | Freq: Once | ORAL | Status: AC
  Administered 2021-09-17: 400 mg via ORAL
  Filled 2021-09-17: qty 1

## 2021-09-17 MED ORDER — OXYCODONE-ACETAMINOPHEN 5-325 MG PO TABS
1.0000 | ORAL_TABLET | Freq: Once | ORAL | Status: AC
  Administered 2021-09-17: 1 via ORAL
  Filled 2021-09-17: qty 1

## 2021-09-17 MED ORDER — OXYCODONE HCL 5 MG PO TABS: 5.0000 mg | ORAL_TABLET | Freq: Four times a day (QID) | ORAL | 0 refills | Status: AC | PRN

## 2021-09-17 NOTE — ED Provider Notes (Signed)
[facility] Provider Note    Event Date/Time   First MD Initiated Contact with Patient 09/17/21 [phone]     (approximate)   History   Flank Pain   HPI  MORA PEDRAZA is a 66 y.o. female past medical history of lung cancer status post resection, coronary disease, GERD, diabetes who presents with right-sided back and flank pain.  She started noticing this on Wednesday 3 days ago within it got worse last night.  Pain is constant and excruciating.  Wraps around the right flank to the right upper abdomen.  It is not made worse after eating she is tolerating p.o. denies nausea or vomiting has had some loose stools but no frank diarrhea.  No fevers or chills.  Denies chest pain.  Does feel mildly short of breath.  Has never had similar pain before.  No urinary symptoms.     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) 11/23/2019   Ischemia of extremity 11/14/2019   Dyspnea 11/13/2019   Pleural effusion 11/13/2019   Hypokalemia 11/05/2019   HLD (hyperlipidemia) 11/05/2019   COPD (chronic obstructive pulmonary disease) ([place]) 11/05/2019   Stroke ([place]) 11/05/2019   Leukocytosis 11/05/2019   Lung mass 11/04/2019   Vasovagal syncope 09/23/2018   H/O right coronary artery stent placement 09/23/2018   Elevated hemoglobin A1c 09/04/2018   Diabetes mellitus type 2 with complications ([place]) 29/47/6546   Tobacco use 07/14/2016   Status post lumbar surgery 07/14/2016   HTN, goal below 140/90 07/14/2016   Healthcare maintenance 07/14/2016   Coronary artery disease involving native coronary artery of native heart without angina pectoris 07/14/2016     Physical Exam  Triage Vital Signs: ED Triage Vitals  Enc Vitals Group     BP 09/17/21 0534 (!) 182/89     Pulse Rate 09/17/21 0534 67     Resp 09/17/21 0534 18     Temp 09/17/21 0534 97.7 F (36.5 C)     Temp Source 09/17/21 0534 Oral     SpO2 09/17/21 0534 99 %     Weight 09/17/21 0528 143 lb (64.9 kg)  Height 09/17/21 0528 5\' 2"  (1.575 m)     Head Circumference --      Peak Flow --      Pain Score 09/17/21 0528 10     Pain Loc --      Pain Edu? --      Excl. in [place]? --     Most recent vital signs: Vitals:   09/17/21 0534 09/17/21 0625  BP: (!) 182/89 (!) 162/92  Pulse: 67 77  Resp: 18 11  Temp: 97.7 F (36.5 C)   SpO2: 99% 98%     General: Awake, no distress.  Patient looks uncomfortable holding her right chest CV:  Good peripheral perfusion.  Resp:  Normal effort.  Abd:  No distention.  Abdomen is soft, no right upper quadrant tenderness, no guarding Neuro:             Awake, Alert, Oriented x 3  Other:  Patient has several erythematous lesions extending from the right CVA  region to under the right breast and right abdomen, one of the lesions look slightly vesicular, exquisitely tender just just to touch the skin   ED Results / Procedures / Treatments  Labs (all labs ordered are listed, but only abnormal results are displayed) Labs Reviewed - No data to display   EKG  EKG interpretation performed by myself: NSR, nml axis, nml intervals, no acute ischemic changes    RADIOLOGY I performed and interpreted a bedside ultrasound of the right upper quadrant which shows normal gallbladder negative Murphy sign no pericholecystic fluid with grossly normal-appearing gallbladder wall without stones  I performed and interpreted a bedside ultrasound of the right kidney which is negative for hydronephrosis   PROCEDURES:  Critical Care performed: No  Procedures  The patient is on the cardiac monitor to evaluate for evidence of arrhythmia and/or significant heart rate changes.   MEDICATIONS ORDERED IN ED: Medications  lidocaine (LIDODERM) 5 % 1 patch (has no administration in time range)  ibuprofen (ADVIL) tablet 400 mg (has no administration in time range)  oxyCODONE-acetaminophen (PERCOCET/ROXICET) 5-325 MG per tablet 1 tablet (1 tablet Oral Given 09/17/21 7341)  valACYclovir (VALTREX) tablet 1,000 mg (1,000 mg Oral Given 09/17/21 9379)     IMPRESSION / MDM / ASSESSMENT AND PLAN / ED COURSE  I reviewed the triage vital signs and the nursing notes.                              Patient's presentation is most consistent with acute presentation with potential threat to life or bodily function.  Differential diagnosis includes, but is not limited to, herpes zoster, cholecystitis, kidney stone, musculoskeletal pain, acute coronary syndrome, pulmonary embolism  The patient is a 66 year old female presents with right back and flank pain.  This started several days ago but worsened yesterday.  The pain is quite severe to the point that she cannot lay on the right  side.  She noticed some itching today as well.  Does feel mildly short of breath but otherwise the pain is constant not made worse after eating without associated GI symptoms or fever.  Patient looks uncomfortable on my initial evaluation.  She is exquisitely tender to touch around the right CVA region extending to the right flank and right upper quadrant under the right breast.  She has several erythematous lesions which are not clearly vesicular although one of them looks vesicular.  It is in a dermatomal distribution.  I suspect that this is herpes zoster which is the cause of her pain and would explain the significant pain and hyperalgesia.  We will obtain right upper quadrant ultrasound and EKG in the abundance of caution.  We will treat pain and started on valacyclovir.  I performed a bedside ultrasound of the right upper quadrant and right kidney which shows overall normal-appearing gallbladder and kidney without hydronephrosis.  Patient's EKG is nonischemic.  Overall with the skin changes and presentation I am fairly confident that this presentation can be attributed to herpes zoster.  Will treat with valacyclovir.  Will prescribe oxycodone for pain.   Patient's pain significantly improved after Percocet but she still is in pain.  Will give Lidoderm patch and Motrin.  I advise she follow-up with primary care and return to ED for any new or worsening symptoms.   FINAL CLINICAL IMPRESSION(S) / ED DIAGNOSES   Final diagnoses:  Herpes zoster without complication     Rx / DC Orders   ED Discharge Orders          Ordered    valACYclovir (VALTREX) 1000 MG tablet  3 times daily        09/17/21 0656    oxyCODONE (ROXICODONE) 5 MG immediate release tablet  Every 6 hours PRN        09/17/21 0656    lidocaine (LIDODERM) 5 %  Every 24 hours        09/17/21 0656             Note:  This document was prepared using Dragon voice recognition software and may include unintentional dictation  errors.   Rada Hay, MD 09/17/21 [phone]

## 2021-09-17 NOTE — Discharge Instructions (Addendum)
I suspect that you have a early shingles.  Please take the Valtrex 3 times a day for the next 10 days which will treat the virus.  You can take oxycodone 5 mg every 6 hours as needed for severe pain in addition to taking Tylenol 1 g every 8 hours.  You can also put the Lidoderm patch on the area of pain which may help with local pain.

## 2021-09-17 NOTE — ED Triage Notes (Signed)
Pt presents to ER with c/o right flank pain that started on Wednesday and has become progressively worse.  Pt states the pain wraps around to RUQ.  Pt endorses some nausea but denies diarrhea or vomiting.  Pt appears uncomfortable at this time but is A&O x4 and in NAD.

## 2021-09-29 NOTE — Progress Notes (Signed)
Carelink Summary Report / Loop Recorder 

## 2021-10-07 ENCOUNTER — Ambulatory Visit
Admission: RE | Admit: 2021-10-07 | Discharge: 2021-10-07 | Disposition: A | Discharge status: Home / Self Care | Payer: Medicare Other | Source: Ambulatory Visit | Attending: Internal Medicine | Admitting: Internal Medicine

## 2021-10-07 DIAGNOSIS — Z1231 Encounter for screening mammogram for malignant neoplasm of breast: Secondary | ICD-10-CM | POA: Insufficient documentation

## 2021-10-12 LAB — CUP PACEART REMOTE DEVICE CHECK
Date Time Interrogation Session: 20231008231837
Implantable Pulse Generator Implant Date: 20210928

## 2021-10-17 ENCOUNTER — Ambulatory Visit (INDEPENDENT_AMBULATORY_CARE_PROVIDER_SITE_OTHER): Payer: Medicare Other

## 2021-10-17 DIAGNOSIS — I639 Cerebral infarction, unspecified: Secondary | ICD-10-CM | POA: Diagnosis not present

## 2021-11-03 NOTE — Progress Notes (Signed)
Carelink Summary Report / Loop Recorder 

## 2021-11-17 ENCOUNTER — Ambulatory Visit: Payer: Medicare Other

## 2021-11-21 ENCOUNTER — Ambulatory Visit (INDEPENDENT_AMBULATORY_CARE_PROVIDER_SITE_OTHER): Payer: Medicare Other

## 2021-11-21 DIAGNOSIS — I639 Cerebral infarction, unspecified: Secondary | ICD-10-CM

## 2021-11-21 LAB — CUP PACEART REMOTE DEVICE CHECK
Date Time Interrogation Session: 20231119231200
Implantable Pulse Generator Implant Date: 20210928

## 2021-12-09 ENCOUNTER — Ambulatory Visit: Payer: Medicare Other | Attending: Medical Oncology

## 2021-12-16 ENCOUNTER — Encounter: Payer: Self-pay | Admitting: Oncology

## 2021-12-16 ENCOUNTER — Inpatient Hospital Stay: Payer: Medicare Other | Attending: Oncology | Admitting: Oncology

## 2021-12-16 VITALS — BP 135/81 | HR 69 | Temp 97.2°F | Resp 16 | Wt 140.6 lb

## 2021-12-16 DIAGNOSIS — Z08 Encounter for follow-up examination after completed treatment for malignant neoplasm: Secondary | ICD-10-CM | POA: Insufficient documentation

## 2021-12-16 DIAGNOSIS — Z85118 Personal history of other malignant neoplasm of bronchus and lung: Secondary | ICD-10-CM | POA: Insufficient documentation

## 2021-12-17 NOTE — Progress Notes (Signed)
Hematology/Oncology Consult note [facility] [place]  Telephone:([phone] [phone]  Patient Care Team: Kirk Ruths, MD as PCP - General (Internal Medicine) Telford Nab, RN as Oncology Nurse Navigator Sindy Guadeloupe, MD as Consulting Physician (Oncology) Nestor Lewandowsky, MD (Inactive) as Referring Physician (Cardiothoracic Surgery)   Name of the patient: Ann [name]  [id number]  1955/01/23   Date of visit: 12/17/21  Diagnosis- history of stage I lung cancer s/p surgery   Chief complaint/ Reason for visit-routine follow-up of lung cancer  Heme/Onc history: Patient is a 66 year old female with a past medical history significant for tobacco dependence, peripheral vascular disease and coronary artery disease.  She underwent CT lung cancer screening protocol in September 2021 which showed a suspicious right upper lobe lung nodule 10.3 mm in size.  This was followed by a PET scan which showed hypermetabolic spiculated right upper lobe lung nodule with an SUV of 7 T1 8 with no evidence of locoregional adenopathy or distant metastatic disease.  Incidental finding of an enlarged right lobe of the thyroid gland.  Intense metabolic activity at the level of distal rectum/anus favored physiologic   Patient was evaluated by Dr. 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  No evidence of visceral pleural invasion.  There was another tumor also in the upper lobe of the lung 0.8 cm which was adenocarcinoma in situ nonmucinous.  Margins negative lymph node sampling of 7 nodes did not show any evidence of malignancy pT1b pN0   Patient had pleural effusion post surgery which was tapped and was negative for malignancy.  Interval history-patient is doing well for her age.  Denies any cough or shortness of breath.  Denies any changes in her weight or  appetite.  ECOG PS- 1 Pain scale- 0   Review of systems- Review of Systems  Constitutional:  Negative for chills, fever, malaise/fatigue and weight loss.  HENT:  Negative for congestion, ear discharge and nosebleeds.   Eyes:  Negative for blurred vision.  Respiratory:  Negative for cough, hemoptysis, sputum production, shortness of breath and wheezing.   Cardiovascular:  Negative for chest pain, palpitations, orthopnea and claudication.  Gastrointestinal:  Negative for abdominal pain, blood in stool, constipation, diarrhea, heartburn, melena, nausea and vomiting.  Genitourinary:  Negative for dysuria, flank pain, frequency, hematuria and urgency.  Musculoskeletal:  Negative for back pain, joint pain and myalgias.  Skin:  Negative for rash.  Neurological:  Negative for dizziness, tingling, focal weakness, seizures, weakness and headaches.  Endo/Heme/Allergies:  Does not bruise/bleed easily.  Psychiatric/Behavioral:  Negative for depression and suicidal ideas. The patient does not have insomnia.       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   Multiple thyroid  nodules    Myocardial infarction ([place]) 09/03/1998   Renal cyst, right    a.) CT on 05/20/2020 - upper pole RIGHT kidney; measured 2.1 cm   S/P total thyroidectomy    Stroke ([place]) 2005   TIA   Vasovagal syncope      Past Surgical History:  Procedure Laterality Date   CAROTID STENT  07/18/2003   COLONOSCOPY     CORONARY ANGIOPLASTY WITH STENT PLACEMENT Left 09/03/1998   Procedure: CARDIAC CATHETERIZATION AND STENT PLACEMENT (NIR stent to RCA)   INSERTION OF MESH  10/08/2020   Procedure: INSERTION OF MESH;  Surgeon: Jules Husbands, MD;  Location: [facility] ORS;  Service: General;;   LOOP RECORDER INSERTION N/A 09/30/2019   LUMBAR FUSION N/A    L4-L5 SPINAL DECOMPRESSION AND FUSION; RODS PLACED   NECK SURGERY     THORACOTOMY/LOBECTOMY Right 11/04/2019   Procedure: THORACOTOMY/LOBECTOMY;  Surgeon: Nestor Lewandowsky, MD;  Location: [facility] ORS;  Service: Thoracic;  Laterality: Right;   THYROIDECTOMY N/A 02/25/2020   Procedure: THYROIDECTOMY, total;  Surgeon: [name], MD;  Location: [facility] ORS;  Service: General;  Laterality: N/A;  Provider requesting 2.5 hours/150 minutes for procedure.   TONSILLECTOMY     TUBAL LIGATION     UMBILICAL HERNIA REPAIR N/A 10/08/2020   Procedure: HERNIA REPAIR UMBILICAL ADULT, open;  Surgeon: Jules Husbands, MD;  Location: [facility] ORS;  Service: General;  Laterality: N/A;    Social History   Socioeconomic History   Marital status: Married    Spouse name: Not on file   Number of children: Not on file   Years of education: Not on file   Highest education level: Not on file  Occupational History   Not on file  Tobacco Use   Smoking status: Former    Packs/day: 0.25    Years: 49.00    Total pack years: 12.25    Types: Cigarettes    Quit date: 10/17/2019    Years since quitting: 2.1   Smokeless tobacco: Never   Tobacco comments:    1 pack lasts 3-4 days  Vaping Use   Vaping Use: Never used  Substance and Sexual Activity   Alcohol use: Yes    Comment:  1 shot of vodka with lemonade every evening   Drug use: Never   Sexual activity: Not on file  Other Topics Concern   Not on file  Social History Narrative   Live at home with hubby   Social Determinants of Health   Financial Resource Strain: Not on file  Food Insecurity: Not on file  Transportation Needs: Not on file  Physical Activity: Not on file  Stress: Not on file  Social Connections: Not on file  Intimate Partner Violence: Not on file    Family History  Problem Relation Age of Onset   Alzheimer's disease Mother    Heart attack Father    Breast cancer Cousin    Diabetes Mellitus II Brother    Diabetes Mellitus II Brother      Current Outpatient Medications:    albuterol (VENTOLIN HFA) 108 (90 Base) MCG/ACT inhaler, Inhale 1-2 puffs into the lungs every 6 (six) hours as needed for wheezing or shortness of breath., Disp: , Rfl:    aspirin 325 MG tablet,  Take 325 mg by mouth in the morning., Disp: , Rfl:    furosemide (LASIX) 20 MG tablet, Take 20 mg by mouth in the morning., Disp: , Rfl:    levothyroxine (SYNTHROID) 88 MCG tablet, Take 1 tablet (88 mcg total) by mouth daily at 6 (six) AM., Disp: 30 tablet, Rfl: 3   metoprolol tartrate (LOPRESSOR) 25 MG tablet, Take 25 mg by mouth 2 (two) times daily., Disp: , Rfl:    pantoprazole (PROTONIX) 40 MG tablet, Take 40 mg by mouth daily., Disp: , Rfl:    rosuvastatin (CRESTOR) 20 MG tablet, Take 20 mg by mouth at bedtime., Disp: , Rfl:    spironolactone (ALDACTONE) 25 MG tablet, Take 25 mg by mouth daily., Disp: , Rfl:    celecoxib (CELEBREX) 100 MG capsule, Take 100 mg by mouth in the morning. (Patient not taking: Reported on 12/08/2020), Disp: , Rfl:    fluticasone (FLONASE) 50 MCG/ACT nasal spray, Place into the nose. (Patient not taking: Reported on 12/16/2021), Disp: , Rfl:    lidocaine (LIDODERM) 5 %, Place 1 patch onto the skin daily. Remove & Discard patch within 12 hours or as directed by MD, Disp: 30 patch, Rfl: 0    phentermine (ADIPEX-P) 37.5 MG tablet, Take 37.5 mg by mouth daily before breakfast. (Patient not taking: Reported on 12/08/2020), Disp: , Rfl:    potassium chloride SA (KLOR-CON) 20 MEQ tablet, Take 20 mEq by mouth in the morning. (Patient not taking: Reported on 12/16/2021), Disp: , Rfl:   Physical exam:  Vitals:   12/16/21 1130  BP: 135/81  Pulse: 69  Resp: 16  Temp: (!) 97.2 F (36.2 C)  SpO2: 99%  Weight: 140 lb 9.6 oz (63.8 kg)   Physical Exam Cardiovascular:     Rate and Rhythm: Normal rate and regular rhythm.     Heart sounds: Normal heart sounds.  Pulmonary:     Effort: Pulmonary effort is normal.     Breath sounds: Normal breath sounds.  Abdominal:     General: Bowel sounds are normal.     Palpations: Abdomen is soft.  Skin:    General: Skin is warm and dry.  Neurological:     Mental Status: She is alert and oriented to person, place, and time.         Latest Ref Rng & Units 01/25/2021   12:30 PM  CMP  Glucose 70 - 99 mg/dL 143   BUN 8 - 23 mg/dL 10   Creatinine 0.44 - 1.00 mg/dL 0.71   Sodium 135 - 145 mmol/L 138   Potassium 3.5 - 5.1 mmol/L 3.6   Chloride 98 - 111 mmol/L 104   CO2 22 - 32 mmol/L 27   Calcium 8.9 - 10.3 mg/dL 8.9   Total Protein 6.5 - 8.1 g/dL 7.5   Total Bilirubin 0.3 - 1.2 mg/dL 0.4   Alkaline Phos 38 - 126 U/L 152   AST 15 - 41 U/L 16   ALT 0 - 44 U/L 16       Latest Ref Rng & Units 01/25/2021   12:30 PM  CBC  WBC 4.0 - 10.5 K/uL 5.8   Hemoglobin 12.0 - 15.0 g/dL 13.8   Hematocrit 36.0 - 46.0 % 42.7   Platelets 150 - 400 K/uL 246     No images are attached to the encounter.  CUP PACEART REMOTE DEVICE CHECK  Result Date: 11/21/2021 ILR summary report received. Battery status OK. Normal device function. No new symptom, tachy, brady,  or pause episodes. No new AF episodes. Monthly summary reports and ROV/PRN LA    Assessment and plan- Patient is a 67 y.o. female with history of stage I right upper lobe lung cancer s/p  lobectomy in November 2021 here for a routine follow-up  Patient has not had her 80-month CT chest done yet.  I will plan to schedule that at this time.  Clinically patient does not have any suspicious signs and symptoms of recurrence.  I will continue to get surveillance scans every 6 months up to 3 years followed by yearly scans.  I will see her back in 6 months   Visit Diagnosis 1. Encounter for follow-up surveillance of lung cancer      Dr. Randa Evens, MD, MPH [facility] [phone] 12/17/2021 12:44 PM

## 2021-12-22 ENCOUNTER — Ambulatory Visit: Payer: Medicare Other

## 2021-12-27 ENCOUNTER — Ambulatory Visit (INDEPENDENT_AMBULATORY_CARE_PROVIDER_SITE_OTHER): Payer: Medicare Other

## 2021-12-27 DIAGNOSIS — I639 Cerebral infarction, unspecified: Secondary | ICD-10-CM

## 2021-12-27 LAB — CUP PACEART REMOTE DEVICE CHECK
Date Time Interrogation Session: 20231225231207
Implantable Pulse Generator Implant Date: 20210928

## 2021-12-29 ENCOUNTER — Ambulatory Visit: Payer: Medicare Other

## 2022-01-03 NOTE — Progress Notes (Signed)
Carelink Summary Report / Loop Recorder 

## 2022-01-05 ENCOUNTER — Ambulatory Visit
Admission: RE | Admit: 2022-01-05 | Discharge: 2022-01-05 | Disposition: A | Discharge status: Home / Self Care | Payer: Medicare Other | Source: Ambulatory Visit | Attending: Medical Oncology | Admitting: Medical Oncology

## 2022-01-05 DIAGNOSIS — C3411 Malignant neoplasm of upper lobe, right bronchus or lung: Secondary | ICD-10-CM | POA: Diagnosis present

## 2022-01-05 DIAGNOSIS — Z85118 Personal history of other malignant neoplasm of bronchus and lung: Secondary | ICD-10-CM | POA: Insufficient documentation

## 2022-01-05 DIAGNOSIS — Z08 Encounter for follow-up examination after completed treatment for malignant neoplasm: Secondary | ICD-10-CM | POA: Insufficient documentation

## 2022-01-09 ENCOUNTER — Other Ambulatory Visit: Payer: Self-pay

## 2022-01-09 DIAGNOSIS — Z08 Encounter for follow-up examination after completed treatment for malignant neoplasm: Secondary | ICD-10-CM

## 2022-01-09 NOTE — Progress Notes (Signed)
Order in.

## 2022-01-18 NOTE — Progress Notes (Signed)
[facility] Summary Report / Loop Recorder

## 2022-01-30 ENCOUNTER — Ambulatory Visit: Payer: Medicare Other | Attending: Internal Medicine

## 2022-01-30 DIAGNOSIS — I639 Cerebral infarction, unspecified: Secondary | ICD-10-CM

## 2022-01-31 LAB — CUP PACEART REMOTE DEVICE CHECK
Date Time Interrogation Session: 20240127231001
Implantable Pulse Generator Implant Date: 20210928

## 2022-02-19 IMAGING — MG MM DIGITAL SCREENING BILAT W/ TOMO AND CAD
8 series · 8 of 24 positions shown · non-contrast
Comparison: Previous exam(s).

CLINICAL DATA: Screening.

EXAM:
DIGITAL SCREENING BILATERAL MAMMOGRAM WITH TOMOSYNTHESIS AND CAD
TECHNIQUE: Bilateral screening digital craniocaudal and mediolateral oblique
mammograms were obtained. Bilateral screening digital breast
tomosynthesis was performed. The images were evaluated with
computer-aided detection.

[R CC synth-2D]
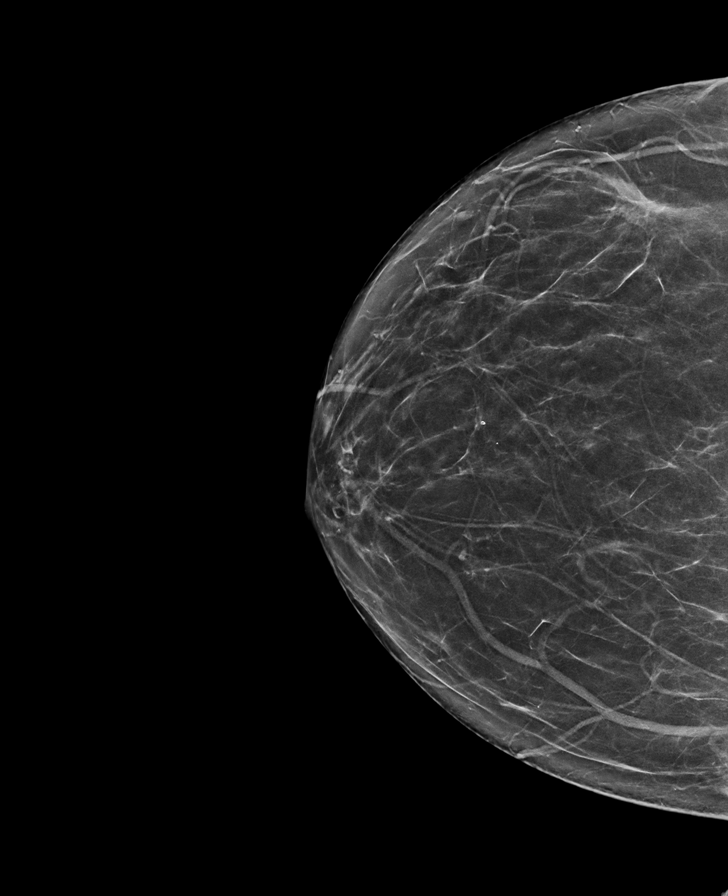

[L MLO synth-2D]
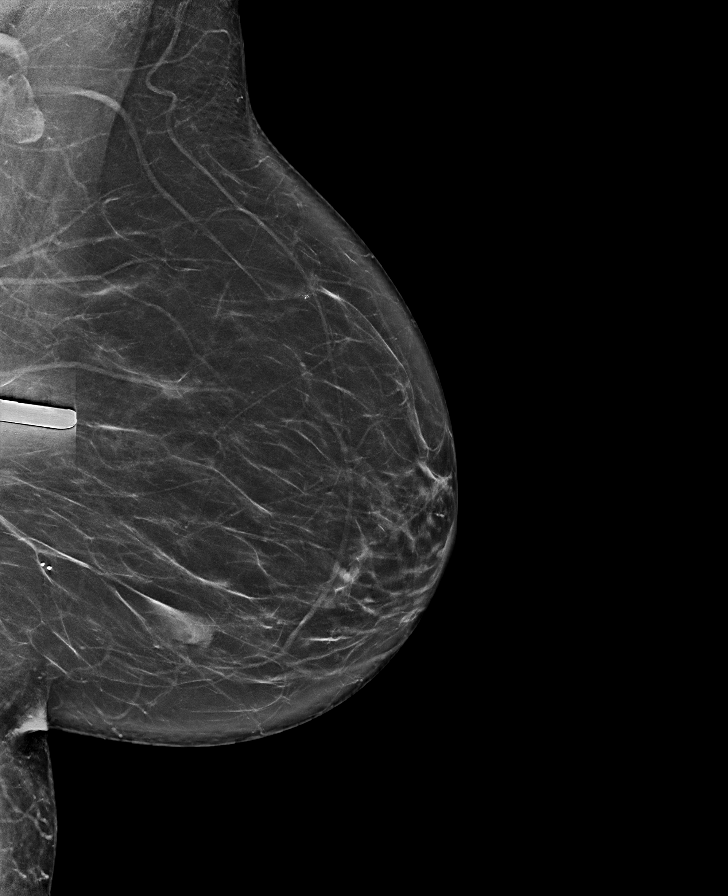

[R MLO synth-2D]
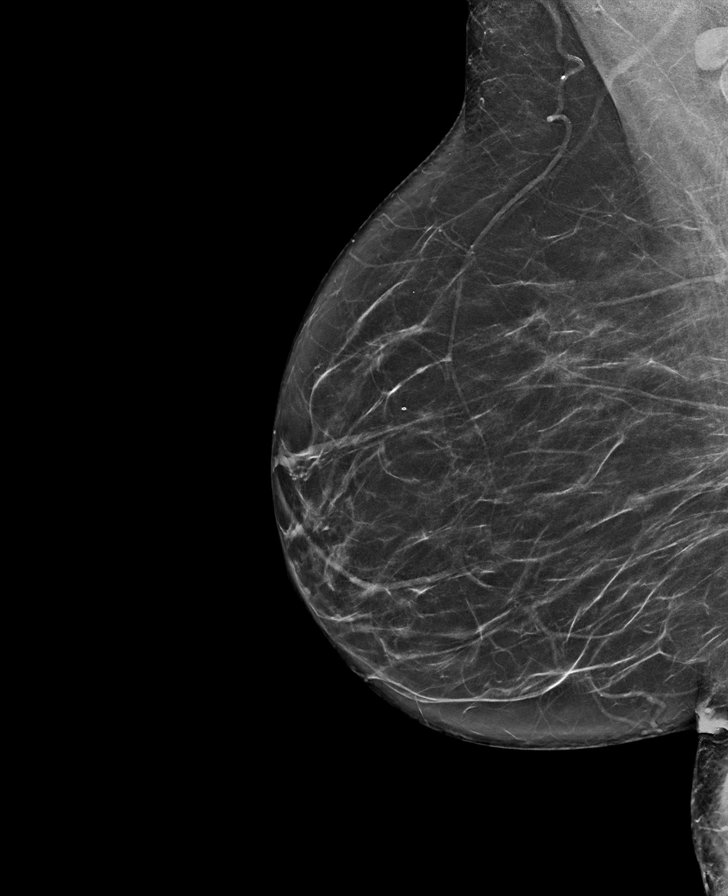

[L CC synth-2D]
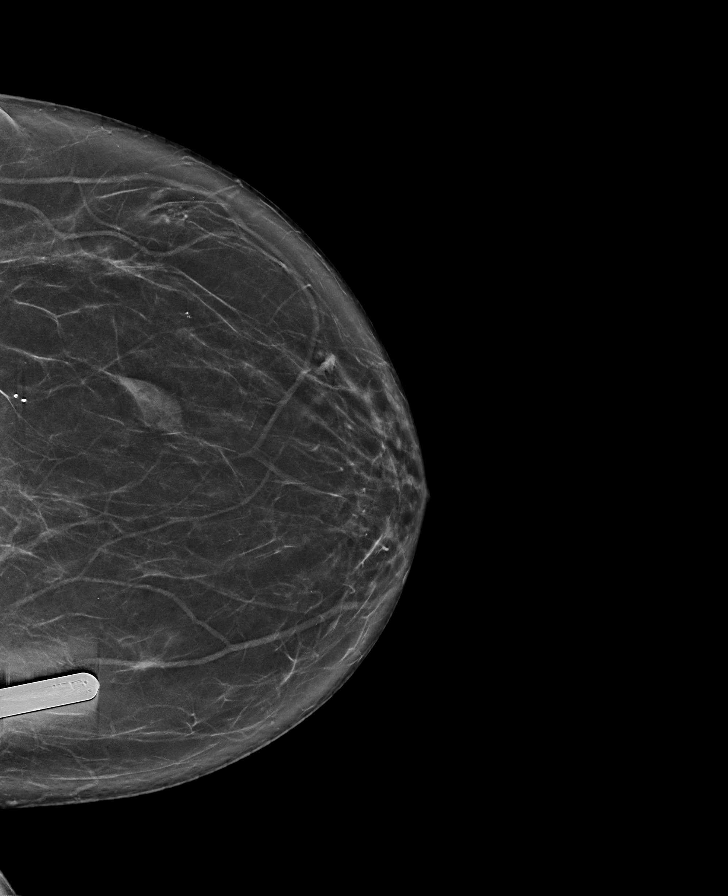

[L CC tomo · tomo slice 37/72.0]
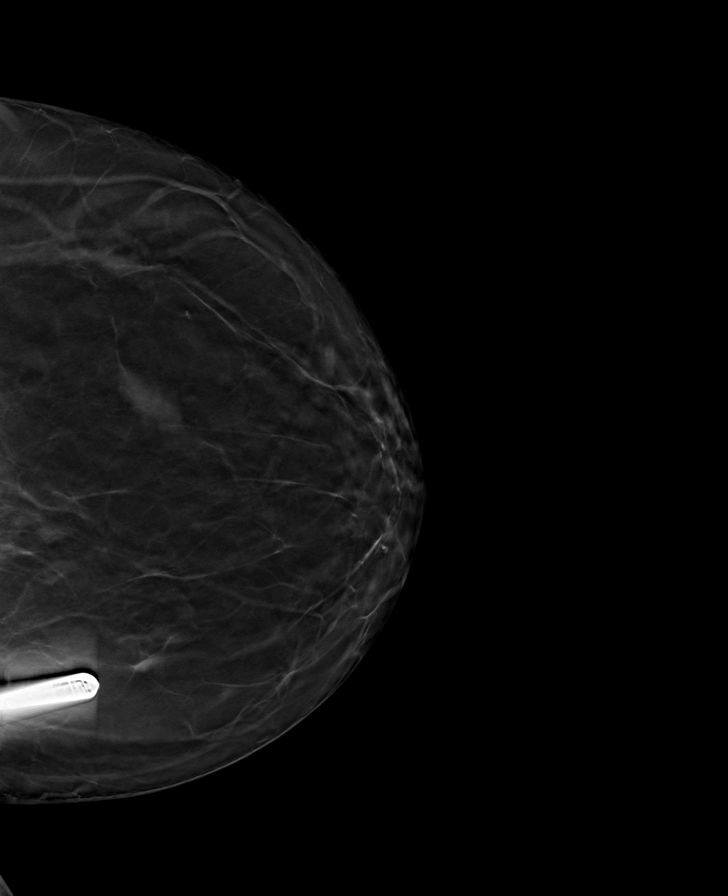

[R CC tomo · tomo slice 36/71.0]
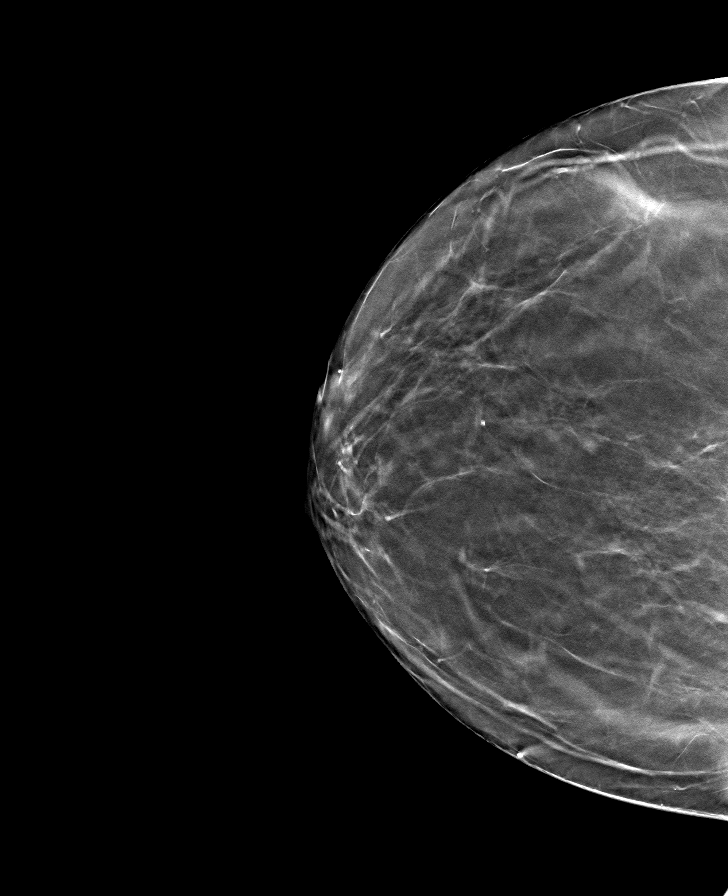

[L MLO tomo · tomo slice 39/77.0]
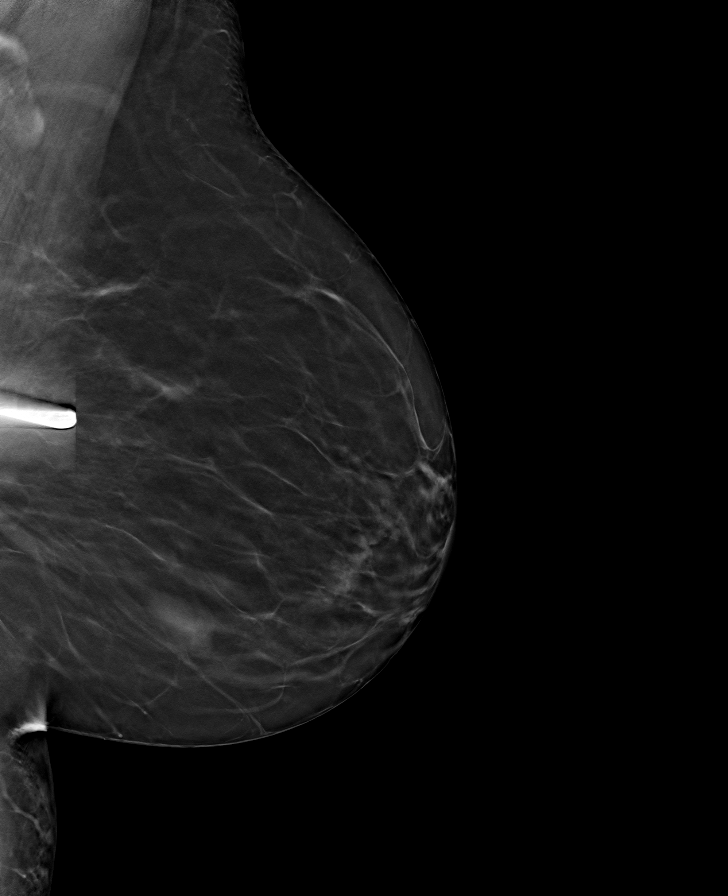

[R MLO tomo · tomo slice 37/72.0]
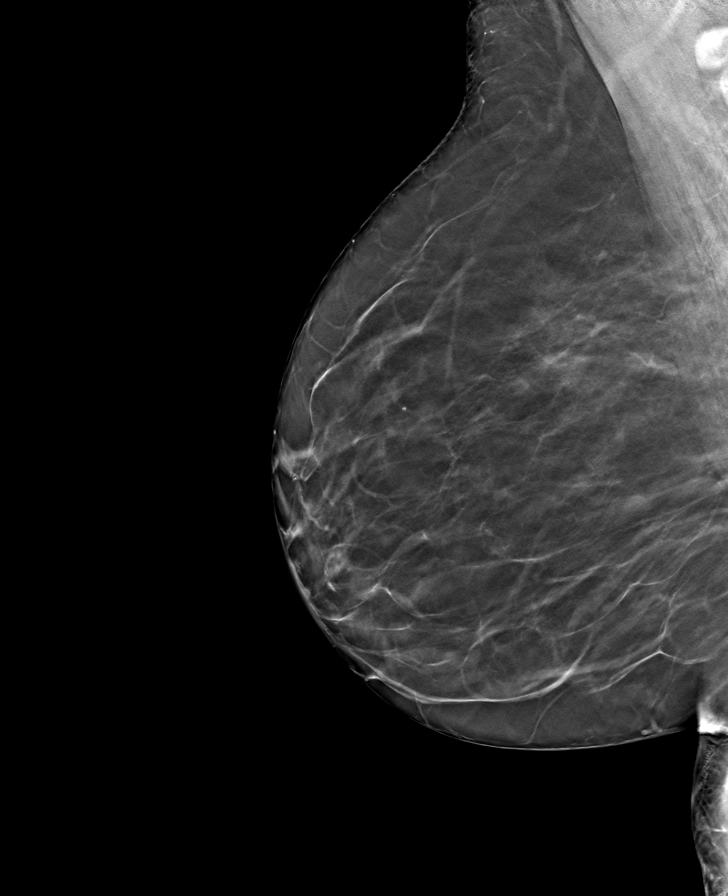

[8 of 24 positions shown; findings below may reference images not displayed]

ACR Breast Density Category b: There are scattered areas of
fibroglandular density.
FINDINGS: There are no findings suspicious for malignancy.
IMPRESSION: No mammographic evidence of malignancy. A result letter of this
screening mammogram will be mailed directly to the patient.

RECOMMENDATION:
Screening mammogram in one year. (Code:51-O-LD2)

BI-RADS CATEGORY  1: Negative.

## 2022-02-23 ENCOUNTER — Encounter (INDEPENDENT_AMBULATORY_CARE_PROVIDER_SITE_OTHER): Payer: Self-pay | Admitting: Vascular Surgery

## 2022-02-23 ENCOUNTER — Other Ambulatory Visit (INDEPENDENT_AMBULATORY_CARE_PROVIDER_SITE_OTHER): Payer: Medicare Other

## 2022-02-23 ENCOUNTER — Ambulatory Visit (INDEPENDENT_AMBULATORY_CARE_PROVIDER_SITE_OTHER): Payer: Medicare Other | Admitting: Vascular Surgery

## 2022-02-23 VITALS — BP 120/85 | HR 76 | Resp 15 | Wt 138.0 lb

## 2022-02-23 DIAGNOSIS — I6523 Occlusion and stenosis of bilateral carotid arteries: Secondary | ICD-10-CM

## 2022-02-23 DIAGNOSIS — I2510 Atherosclerotic heart disease of native coronary artery without angina pectoris: Secondary | ICD-10-CM

## 2022-02-23 DIAGNOSIS — E785 Hyperlipidemia, unspecified: Secondary | ICD-10-CM

## 2022-02-23 DIAGNOSIS — I10 Essential (primary) hypertension: Secondary | ICD-10-CM

## 2022-02-23 NOTE — Progress Notes (Signed)
MRN : [id number]  Ann [name] is a 67 y.o. ([birth year]) female who presents with chief complaint of check carotid arteries.  History of Present Illness:   The patient is seen for follow up evaluation of carotid stenosis. The carotid stenosis followed by ultrasound.    The patient denies amaurosis fugax. There is no recent history of TIA symptoms or focal motor deficits. There is no prior documented CVA.   The patient is taking enteric-coated aspirin 81 mg daily.   There is no history of migraine headaches. There is no history of seizures.   The patient has a history of coronary artery disease, no recent episodes of angina or shortness of breath. The patient denies PAD or claudication symptoms. There is a history of hyperlipidemia which is being treated with a statin.     Duplex ultrasound shows <40% RICA and occlusion (known) of the LICA.  No change compared to last study.  No outpatient medications have been marked as taking for the 02/23/22 encounter (Appointment) with Delana Meyer, Dolores Lory, MD.    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); b.) s/p RUL lobectomy 11/04/2019   Aortic atherosclerosis ([place])    Carotid artery stenosis    COPD (chronic obstructive pulmonary disease) ([employer])    Coronary artery disease    NIR stent to RCA   Diabetes mellitus without complication (HCC)    Diverticulosis    GERD (gastroesophageal reflux disease)    History of 2019 novel coronavirus disease (COVID-19) 06/17/2020   HLD (hyperlipidemia)    Hypertension    Hypokalemia    Implantable loop recorder present 09/30/2019   Left upper lobe pulmonary nodule 05/20/2020   a.) CT on 05/20/2020 --> new "tiny" pulmonary nodule; measured 4 mm.   Multiple thyroid nodules    Myocardial infarction ([place]) 09/03/1998   Renal cyst, right    a.) CT on 05/20/2020 - upper pole RIGHT kidney; measured  2.1 cm   S/P total thyroidectomy    Stroke ([place]) 2005   TIA   Vasovagal syncope     Past Surgical History:  Procedure Laterality Date   CAROTID STENT  07/18/2003   COLONOSCOPY     CORONARY ANGIOPLASTY WITH STENT PLACEMENT Left 09/03/1998   Procedure: CARDIAC CATHETERIZATION AND STENT PLACEMENT (NIR stent to RCA)   INSERTION OF MESH  10/08/2020   Procedure: INSERTION OF MESH;  Surgeon: Jules Husbands, MD;  Location: [facility] ORS;  Service: General;;   LOOP RECORDER INSERTION N/A 09/30/2019   LUMBAR FUSION N/A    L4-L5 SPINAL DECOMPRESSION AND FUSION; RODS PLACED   NECK SURGERY     THORACOTOMY/LOBECTOMY Right 11/04/2019   Procedure: THORACOTOMY/LOBECTOMY;  Surgeon: Nestor Lewandowsky, MD;  Location: [facility];  Service: Thoracic;  Laterality: Right;   THYROIDECTOMY N/A 02/25/2020   Procedure: THYROIDECTOMY, total;  Surgeon: [name], MD;  Location: [facility] OR[facility];  Service: General;  Laterality: N/A;  Provider requesting 2.5 hours/150 minutes for procedure.   TONSILLECTOMY     TUBAL LIGATION     UMBILICAL HERNIA REPAIR N/A 10/08/2020   Procedure: HERNIA REPAIR UMBILICAL ADULT, open;  Surgeon: Jules Husbands, MD;  Location: [facility];  Service: General;  Laterality: N/A;    Social History Social History   Tobacco Use   Smoking status: Former    Packs/day: 0.25    Years: 49.00    Total pack years:  12.25    Types: Cigarettes    Quit date: 10/17/2019    Years since quitting: 2.3   Smokeless tobacco: Never   Tobacco comments:    1 pack lasts 3-4 days  Vaping Use   Vaping Use: Never used  Substance Use Topics   Alcohol use: Yes    Comment: 1 shot of vodka with lemonade every evening   Drug use: Never    Family History Family History  Problem Relation Age of Onset   Alzheimer's disease Mother    Heart attack Father    Breast cancer Cousin    Diabetes Mellitus II Brother    Diabetes Mellitus II Brother     Allergies  Allergen Reactions   Clopidogrel Anaphylaxis and Hives    Iodinated Contrast Media Swelling    Face, neck and throat     REVIEW OF SYSTEMS (Negative unless checked)  Constitutional: '[]'$ Weight loss  '[]'$ Fever  '[]'$ Chills Cardiac: '[]'$ Chest pain   '[]'$ Chest pressure   '[]'$ Palpitations   '[]'$ Shortness of breath when laying flat   '[]'$ Shortness of breath with exertion. Vascular:  '[x]'$ Pain in legs with walking   '[]'$ Pain in legs at rest  '[]'$ History of DVT   '[]'$ Phlebitis   '[]'$ Swelling in legs   '[]'$ Varicose veins   '[]'$ Non-healing ulcers Pulmonary:   '[]'$ Uses home oxygen   '[]'$ Productive cough   '[]'$ Hemoptysis   '[]'$ Wheeze  '[x]'$ COPD   '[]'$ Asthma Neurologic:  '[]'$ Dizziness   '[]'$ Seizures   '[]'$ History of stroke   '[]'$ History of TIA  '[]'$ Aphasia   '[]'$ Vissual changes   '[]'$ Weakness or numbness in arm   '[]'$ Weakness or numbness in leg Musculoskeletal:   '[]'$ Joint swelling   '[]'$ Joint pain   '[]'$ Low back pain Hematologic:  '[]'$ Easy bruising  '[]'$ Easy bleeding   '[]'$ Hypercoagulable state   '[]'$ Anemic Gastrointestinal:  '[]'$ Diarrhea   '[]'$ Vomiting  '[]'$ Gastroesophageal reflux/heartburn   '[]'$ Difficulty swallowing. Genitourinary:  '[]'$ Chronic kidney disease   '[]'$ Difficult urination  '[]'$ Frequent urination   '[]'$ Blood in urine Skin:  '[]'$ Rashes   '[]'$ Ulcers  Psychological:  '[]'$ History of anxiety   '[]'$  History of major depression.  Physical Examination  There were no vitals filed for this visit. There is no height or weight on file to calculate BMI. Gen: WD/WN, NAD Head: [place]/AT, No temporalis wasting.  Ear/Nose/Throat: Hearing grossly intact, nares w/o erythema or drainage Eyes: PER, EOMI, sclera nonicteric.  Neck: Supple, no masses.  No bruit or JVD.  Pulmonary:  Good air movement, no audible wheezing, no use of accessory muscles.  Cardiac: RRR, normal S1, S2, no Murmurs. Vascular:  carotid bruit noted Vessel Right Left  Radial Palpable Palpable  Carotid  Palpable  Palpable  Subclav  Palpable Palpable  Gastrointestinal: soft, non-distended. No guarding/no peritoneal signs.  Musculoskeletal: M/S 5/5 throughout.  No visible deformity.   Neurologic: CN 2-12 intact. Pain and light touch intact in extremities.  Symmetrical.  Speech is fluent. Motor exam as listed above. Psychiatric: Judgment intact, Mood & affect appropriate for pt's clinical situation. Dermatologic: No rashes or ulcers noted.  No changes consistent with cellulitis.   CBC Lab Results  Component Value Date   WBC 5.8 01/25/2021   HGB 13.8 01/25/2021   HCT 42.7 01/25/2021   MCV 92.0 01/25/2021   PLT 246 01/25/2021    BMET    Component Value Date/Time   NA 138 01/25/2021 1230   K 3.6 01/25/2021 1230   CL 104 01/25/2021 1230   CO2 27 01/25/2021 1230   GLUCOSE 143 (H) 01/25/2021 1230   BUN 10 01/25/2021 1230  CREATININE 0.71 01/25/2021 1230   CALCIUM 8.9 01/25/2021 1230   GFRNONAA >60 01/25/2021 1230   GFRAA >60 09/18/2019 1031   CrCl cannot be calculated (Patient's most recent lab result is older than the maximum 21 days allowed.).  COAG Lab Results  Component Value Date   INR 0.9 10/31/2019    Radiology CUP PACEART REMOTE DEVICE CHECK  Result Date: 01/31/2022 ILR summary report received. Battery status OK. Normal device function. No new symptom, tachy, brady, or pause episodes. No new AF episodes. Monthly summary reports and ROV/PRN LA    Assessment/Plan 1. Bilateral carotid artery stenosis Recommend:   Given the patient's asymptomatic subcritical stenosis no further invasive testing or surgery at this time.   Duplex ultrasound shows <40% RICA and occlusion (known) of the LICA.   Continue antiplatelet therapy as prescribed Continue management of CAD, HTN and Hyperlipidemia Healthy heart diet,  encouraged exercise at least 4 times per week Follow up in 12 months with duplex ultrasound and physical exam. - VAS US CAROTID; Future  2. HTN, goal below 140/90 Continue antihypertensive medications as already ordered, these medications have been reviewed and there are no changes at this time.  3. Coronary artery disease involving  native coronary artery of native heart without angina pectoris Continue cardiac and antihypertensive medications as already ordered and reviewed, no changes at this time.  Continue statin as ordered and reviewed, no changes at this time  Nitrates PRN for chest pain  4. Hyperlipidemia, unspecified hyperlipidemia type Continue statin as ordered and reviewed, no changes at this time    Hortencia Pilar, MD  02/23/2022 8:58 AM

## 2022-02-25 ENCOUNTER — Encounter (INDEPENDENT_AMBULATORY_CARE_PROVIDER_SITE_OTHER): Payer: Self-pay | Admitting: Vascular Surgery

## 2022-03-06 ENCOUNTER — Ambulatory Visit (INDEPENDENT_AMBULATORY_CARE_PROVIDER_SITE_OTHER): Payer: Medicare Other

## 2022-03-06 DIAGNOSIS — I639 Cerebral infarction, unspecified: Secondary | ICD-10-CM | POA: Diagnosis not present

## 2022-03-06 LAB — CUP PACEART REMOTE DEVICE CHECK
Date Time Interrogation Session: 20240229231217
Implantable Pulse Generator Implant Date: 20210928

## 2022-03-14 NOTE — Progress Notes (Signed)
Carelink Summary Report / Loop Recorder 

## 2022-04-10 ENCOUNTER — Ambulatory Visit (INDEPENDENT_AMBULATORY_CARE_PROVIDER_SITE_OTHER): Payer: Medicare Other

## 2022-04-10 DIAGNOSIS — I639 Cerebral infarction, unspecified: Secondary | ICD-10-CM

## 2022-04-10 LAB — CUP PACEART REMOTE DEVICE CHECK
Date Time Interrogation Session: 20240407231215
Implantable Pulse Generator Implant Date: 20210928

## 2022-04-11 NOTE — Progress Notes (Signed)
Carelink Summary Report / Loop Recorder 

## 2022-05-15 ENCOUNTER — Ambulatory Visit (INDEPENDENT_AMBULATORY_CARE_PROVIDER_SITE_OTHER): Payer: Medicare Other

## 2022-05-15 DIAGNOSIS — I639 Cerebral infarction, unspecified: Secondary | ICD-10-CM | POA: Diagnosis not present

## 2022-05-15 LAB — CUP PACEART REMOTE DEVICE CHECK
Date Time Interrogation Session: 20240510230637
Implantable Pulse Generator Implant Date: 20210928

## 2022-05-15 NOTE — Progress Notes (Signed)
Carelink Summary Report / Loop Recorder 

## 2022-06-07 NOTE — Progress Notes (Signed)
Carelink Summary Report / Loop Recorder 

## 2022-06-14 ENCOUNTER — Ambulatory Visit (INDEPENDENT_AMBULATORY_CARE_PROVIDER_SITE_OTHER): Payer: Medicare Other

## 2022-06-14 DIAGNOSIS — I639 Cerebral infarction, unspecified: Secondary | ICD-10-CM

## 2022-06-16 LAB — CUP PACEART REMOTE DEVICE CHECK
Date Time Interrogation Session: 20240612230932
Implantable Pulse Generator Implant Date: 20210928

## 2022-06-23 ENCOUNTER — Ambulatory Visit: Payer: Medicare Other | Admitting: Oncology

## 2022-07-07 ENCOUNTER — Ambulatory Visit: Admission: RE | Admit: 2022-07-07 | Payer: Medicare Other | Source: Ambulatory Visit

## 2022-07-10 NOTE — Progress Notes (Signed)
Carelink Summary Report / Loop Recorder 

## 2022-07-11 ENCOUNTER — Encounter: Payer: Self-pay | Admitting: Oncology

## 2022-07-11 ENCOUNTER — Inpatient Hospital Stay: Payer: Medicare Other | Attending: Oncology | Admitting: Oncology

## 2022-07-17 ENCOUNTER — Ambulatory Visit: Payer: Medicare Other

## 2022-07-17 DIAGNOSIS — I639 Cerebral infarction, unspecified: Secondary | ICD-10-CM | POA: Diagnosis not present

## 2022-07-18 LAB — CUP PACEART REMOTE DEVICE CHECK
Date Time Interrogation Session: 20240715231111
Implantable Pulse Generator Implant Date: 20210928

## 2022-07-21 ENCOUNTER — Telehealth: Payer: Self-pay | Admitting: *Deleted

## 2022-07-21 NOTE — Telephone Encounter (Signed)
Patient called to reschedule her appointment °

## 2022-07-28 NOTE — Progress Notes (Signed)
Carelink Summary Report / Loop Recorder 

## 2022-08-08 ENCOUNTER — Ambulatory Visit
Admission: RE | Admit: 2022-08-08 | Discharge: 2022-08-08 | Disposition: A | Discharge status: Home / Self Care | Payer: Medicare Other | Source: Ambulatory Visit | Attending: Oncology | Admitting: Oncology

## 2022-08-08 DIAGNOSIS — Z08 Encounter for follow-up examination after completed treatment for malignant neoplasm: Secondary | ICD-10-CM | POA: Insufficient documentation

## 2022-08-08 DIAGNOSIS — Z85118 Personal history of other malignant neoplasm of bronchus and lung: Secondary | ICD-10-CM | POA: Insufficient documentation

## 2022-08-15 ENCOUNTER — Encounter: Payer: Self-pay | Admitting: Oncology

## 2022-08-15 ENCOUNTER — Inpatient Hospital Stay: Payer: Medicare Other | Attending: Oncology | Admitting: Oncology

## 2022-08-15 VITALS — BP 105/74 | HR 61 | Temp 96.5°F | Resp 18 | Ht 62.0 in | Wt 137.7 lb

## 2022-08-15 DIAGNOSIS — Z85118 Personal history of other malignant neoplasm of bronchus and lung: Secondary | ICD-10-CM | POA: Insufficient documentation

## 2022-08-15 DIAGNOSIS — Z08 Encounter for follow-up examination after completed treatment for malignant neoplasm: Secondary | ICD-10-CM | POA: Insufficient documentation

## 2022-08-15 NOTE — Progress Notes (Signed)
Hematology/Oncology Consult note [facility]  [phone] [phone]  Patient Care Team: Lauro Regulus, MD as PCP - General (Internal Medicine) Glory Buff, RN as Oncology Nurse Navigator Creig Hines, MD as Consulting Physician (Oncology) Hulda Marin, MD (Inactive) as Referring Physician (Cardiothoracic Surgery)   Name of the patient: Ann [name]  [id number]  [birth year]   Date of visit: 08/15/22  Diagnosis- history of stage I lung cancer s/p surgery   Chief complaint/ Reason for visit- routine f/u of lung cancer  Heme/Onc history: Patient is a 67 year old female with a past medical history significant for tobacco dependence, peripheral vascular disease and coronary artery disease.  She underwent CT lung cancer screening protocol in September 2021 which showed a suspicious right upper lobe lung nodule 10.3 mm in size.  This was followed by a PET scan which showed hypermetabolic spiculated right upper lobe lung nodule with an SUV of 7 T1 8 with no evidence of locoregional adenopathy or distant metastatic disease.  Incidental finding of an enlarged right lobe of the thyroid gland.  Intense metabolic activity at the level of distal rectum/anus favored physiologic   Patient was evaluated by Dr. 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  No evidence of visceral pleural invasion.  There was another tumor also in the upper lobe of the lung 0.8 cm which was adenocarcinoma in situ nonmucinous.  Margins negative lymph node sampling of 7 nodes did not show any evidence of malignancy pT1b pN0   Patient had pleural effusion post surgery which was tapped and was negative for malignancy.  Interval history-patient is doing well presently and denies any specific complaints at this time.  Appetite and weight have remained  stable denies any cough or exertional shortness of breath.  ECOG PS- 1 Pain scale- 0   Review of systems- Review of Systems  Constitutional:  Negative for chills, fever, malaise/fatigue and weight loss.  HENT:  Negative for congestion, ear discharge and nosebleeds.   Eyes:  Negative for blurred vision.  Respiratory:  Negative for cough, hemoptysis, sputum production, shortness of breath and wheezing.   Cardiovascular:  Negative for chest pain, palpitations, orthopnea and claudication.  Gastrointestinal:  Negative for abdominal pain, blood in stool, constipation, diarrhea, heartburn, melena, nausea and vomiting.  Genitourinary:  Negative for dysuria, flank pain, frequency, hematuria and urgency.  Musculoskeletal:  Negative for back pain, joint pain and myalgias.  Skin:  Negative for rash.  Neurological:  Negative for dizziness, tingling, focal weakness, seizures, weakness and headaches.  Endo/Heme/Allergies:  Does not bruise/bleed easily.  Psychiatric/Behavioral:  Negative for depression and suicidal ideas. The patient does not have insomnia.       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   Multiple thyroid nodules    Myocardial infarction (HCC) 09/03/1998   Renal cyst, right    a.) CT on 05/20/2020 - upper pole RIGHT kidney; measured 2.1 cm   S/P total thyroidectomy    Stroke (HCC) 2005   TIA   Vasovagal syncope      Past Surgical History:  Procedure Laterality Date   CAROTID STENT  07/18/2003   COLONOSCOPY     CORONARY ANGIOPLASTY WITH STENT PLACEMENT Left 09/03/1998   Procedure: CARDIAC CATHETERIZATION AND STENT PLACEMENT (NIR stent to RCA)   INSERTION OF MESH  10/08/2020   Procedure: INSERTION OF MESH;  Surgeon: Leafy Ro, MD;  Location: [facility] ORS;  Service: General;;   LOOP RECORDER INSERTION N/A 09/30/2019   LUMBAR FUSION N/A    L4-L5 SPINAL DECOMPRESSION AND FUSION; RODS PLACED   NECK SURGERY     THORACOTOMY/LOBECTOMY Right 11/04/2019   Procedure: THORACOTOMY/LOBECTOMY;  Surgeon: Hulda Marin, MD;  Location: [facility] ORS;  Service: Thoracic;  Laterality: Right;   THYROIDECTOMY N/A 02/25/2020   Procedure: THYROIDECTOMY, total;  Surgeon: Duanne Guess, MD;  Location: [facility] ORS;  Service: General;  Laterality: N/A;  Provider requesting 2.5 hours/150 minutes for procedure.   TONSILLECTOMY     TUBAL LIGATION     UMBILICAL HERNIA REPAIR N/A 10/08/2020   Procedure: HERNIA REPAIR UMBILICAL ADULT, open;  Surgeon: Leafy Ro, MD;  Location: [facility] ORS;  Service: General;  Laterality: N/A;    Social History   Socioeconomic History   Marital status: Married    Spouse name: Not on file   Number of children: Not on file   Years of education: Not on file   Highest education level: Not on file  Occupational History   Not on file  Tobacco Use   Smoking status: Former    Current packs/day: 0.00    Average packs/day: 0.3 packs/day for 49.0 years (12.3 ttl pk-yrs)    Types: Cigarettes    Start date: 10/17/1970    Quit date: 10/17/2019    Years since quitting: 2.8   Smokeless tobacco: Never   Tobacco comments:    1 pack lasts 3-4 days   Vaping Use   Vaping status: Never Used  Substance and Sexual Activity   Alcohol use: Yes    Comment: 1 shot of vodka with lemonade every evening   Drug use: Never   Sexual activity: Not on file  Other Topics Concern   Not on file  Social History Narrative   Live at home with hubby   Social Determinants of Health   Financial Resource Strain: Low Risk  (03/07/2022)   Received from [facility], [facility]   Overall Financial Resource Strain (CARDIA)    Difficulty of Paying Living Expenses: Not hard at all  Food Insecurity: No Food Insecurity (03/07/2022)   Received from [facility], [facility]   Hunger Vital Sign    Worried About Running Out of Food in the Last Year: Never true    Ran Out of Food in the Last Year: Never true  Transportation Needs: No Transportation Needs (03/07/2022)   Received from [facility], [facility]   [facility] - Transportation  In the past 12 months, has lack of transportation kept you from medical appointments or from getting medications?: No    Lack of Transportation (Non-Medical): No  Physical Activity: Not on file  Stress: Not on file  Social Connections: Not on file  Intimate Partner Violence: Not on file    Family History  Problem Relation Age of Onset   Alzheimer's disease Mother    Heart attack Father    Breast cancer Cousin    Diabetes Mellitus II Brother    Diabetes Mellitus II Brother      Current Outpatient Medications:    fluticasone (FLONASE) 50 MCG/ACT nasal spray, Place into the nose., Disp: , Rfl:    furosemide (LASIX) 20 MG tablet, Take 20 mg by mouth in the morning., Disp: , Rfl:    rosuvastatin (CRESTOR) 20 MG tablet, Take 20 mg by mouth at bedtime., Disp: , Rfl:    spironolactone (ALDACTONE) 25 MG tablet, Take 25 mg by mouth daily., Disp: , Rfl:    albuterol (VENTOLIN HFA) 108 (90 Base) MCG/ACT inhaler, Inhale 1-2 puffs  into the lungs every 6 (six) hours as needed for wheezing or shortness of breath., Disp: , Rfl:    aspirin 325 MG tablet, Take 325 mg by mouth in the morning., Disp: , Rfl:    celecoxib (CELEBREX) 100 MG capsule, Take 100 mg by mouth in the morning. (Patient not taking: Reported on 12/08/2020), Disp: , Rfl:    levothyroxine (SYNTHROID) 88 MCG tablet, Take 1 tablet (88 mcg total) by mouth daily at 6 (six) AM., Disp: 30 tablet, Rfl: 3   lidocaine (LIDODERM) 5 %, Place 1 patch onto the skin daily. Remove & Discard patch within 12 hours or as directed by MD (Patient not taking: Reported on 08/15/2022), Disp: 30 patch, Rfl: 0   metoprolol tartrate (LOPRESSOR) 25 MG tablet, Take 25 mg by mouth 2 (two) times daily., Disp: , Rfl:    pantoprazole (PROTONIX) 40 MG tablet, Take 40 mg by mouth daily., Disp: , Rfl:    phentermine (ADIPEX-P) 37.5 MG tablet, Take 37.5 mg by mouth daily before breakfast. (Patient not taking: Reported on 08/15/2022), Disp: , Rfl:    potassium chloride SA (KLOR-CON) 20 MEQ tablet, Take 20 mEq by mouth in the morning. (Patient not taking: Reported on 08/15/2022), Disp: , Rfl:   Physical exam:  Vitals:   08/15/22 1134  BP: 105/74  Pulse: 61  Resp: 18  Temp: (!) 96.5 F (35.8 C)  TempSrc: Tympanic  SpO2: 100%  Weight: 137 lb 11.2 oz (62.5 kg)  Height: 5\' 2"  (1.575 m)   Physical Exam Cardiovascular:     Rate and Rhythm: Normal rate and regular rhythm.     Heart sounds: Normal heart sounds.  Pulmonary:     Effort: Pulmonary effort is normal.     Breath sounds: Normal breath sounds.  Abdominal:     General: Bowel sounds are normal.     Palpations: Abdomen is soft.  Skin:    General: Skin is warm and dry.  Neurological:     Mental Status: She is alert and oriented to person, place, and time.         Latest Ref Rng & Units 01/25/2021   12:30 PM  CMP  Glucose 70 - 99 mg/dL 409   BUN 8 - 23 mg/dL 10   Creatinine 8.11 - 1.00 mg/dL 9.14   Sodium 782 - 956 mmol/L 138    Potassium 3.5 - 5.1 mmol/L 3.6  Chloride 98 - 111 mmol/L 104   CO2 22 - 32 mmol/L 27   Calcium 8.9 - 10.3 mg/dL 8.9   Total Protein 6.5 - 8.1 g/dL 7.5   Total Bilirubin 0.3 - 1.2 mg/dL 0.4   Alkaline Phos 38 - 126 U/L 152   AST 15 - 41 U/L 16   ALT 0 - 44 U/L 16       Latest Ref Rng & Units 01/25/2021   12:30 PM  CBC  WBC 4.0 - 10.5 K/uL 5.8   Hemoglobin 12.0 - 15.0 g/dL 16.1   Hematocrit 09.6 - 46.0 % 42.7   Platelets 150 - 400 K/uL 246     No images are attached to the encounter.  CT Chest Wo Contrast  Result Date: 08/13/2022 CLINICAL DATA:  History of non-small-cell lung cancer. Restaging. * Tracking Code: BO * EXAM: CT CHEST WITHOUT CONTRAST TECHNIQUE: Multidetector CT imaging of the chest was performed following the standard protocol without IV contrast. RADIATION DOSE REDUCTION: This exam was performed according to the departmental dose-optimization program which includes automated exposure control, adjustment of the mA and/or kV according to patient size and/or use of iterative reconstruction technique. COMPARISON:  01/05/2022 FINDINGS: Cardiovascular: The heart size is normal. No substantial pericardial effusion. Coronary artery calcification is evident. Mild atherosclerotic calcification is noted in the wall of the thoracic aorta. Mediastinum/Nodes: No mediastinal lymphadenopathy. No evidence for gross hilar lymphadenopathy although assessment is limited by the lack of intravenous contrast on the current study. The esophagus has normal imaging features. There is no axillary lymphadenopathy. Lungs/Pleura: Centrilobular emphsyema noted. Surgical staple line again noted anterolateral right lung. Status post right upper lobectomy. 4 mm left upper lobe pulmonary nodule seen on the previous study has resolved completely in the interval. The subtle 3 mm ground-glass nodule described previously in the medial left upper lobe is stable (26/4) Platelike atelectasis or scarring in the lingula  is similar although there is a new nodular component in this region measuring 4 mm on image 102/4. No new overtly suspicious nodule or mass. No focal airspace consolidation. There is no evidence of pleural effusion. Upper Abdomen: Stable right renal cyst. No followup imaging is recommended. No adrenal nodule or mass. Musculoskeletal: No worrisome lytic or sclerotic osseous abnormality. IMPRESSION: 1. Interval resolution of the 4 mm left upper lobe pulmonary nodule seen on the previous study. 2. New 4 mm nodular component in the region of platelike chronic atelectasis or scarring in the lingula. Attention on follow-up recommended. 3. Stable 3 mm ground-glass nodule in the medial left upper lobe. This could also be reassessed at the time of follow-up. 4. Aortic Atherosclerosis (ICD10-I70.0) and Emphysema (ICD10-J43.9). Electronically Signed   By: Kennith Center M.D.   On: 08/13/2022 07:16   CUP PACEART REMOTE DEVICE CHECK  Result Date: 07/18/2022 ILR summary report received. Battery status OK. Normal device function. No new symptom, tachy, brady, or pause episodes. No new AF episodes. Monthly summary reports and ROV/PRN LA, CVRS    Assessment and plan- Patient is a 67 y.o. female  with history of stage I right upper lobe lung cancer s/p lobectomy in November 2021.  This is a routineFollow-up visit  Clinically patient is doing well with concerning symptoms on today's exam.  No CT chest without contrast from 08/08/2022 did not show any evidence of recurrent or progressive disease.  She was noted to have a 4 mm left upper lobe granuloma past which has resolved.  Abiraterone millimeter nodule is stable.  There is interval pulmonary millimeter nodular component hemodynamically manage she will continue to keep an eye on.  I will repeat CT chest without contrast in 6 months and see her back after   Visit Diagnosis 1. Encounter for follow-up surveillance of lung cancer      Dr. Owens Shark, MD, MPH [facility] at  [facility] [phone] 08/15/2022 3:12 PM

## 2022-08-17 ENCOUNTER — Ambulatory Visit (INDEPENDENT_AMBULATORY_CARE_PROVIDER_SITE_OTHER): Payer: Medicare Other

## 2022-08-17 DIAGNOSIS — R55 Syncope and collapse: Secondary | ICD-10-CM

## 2022-08-21 LAB — CUP PACEART REMOTE DEVICE CHECK
Date Time Interrogation Session: 20240817230523
Implantable Pulse Generator Implant Date: 20210928

## 2022-08-29 NOTE — Progress Notes (Signed)
Carelink Summary Report / Loop Recorder 

## 2022-09-01 ENCOUNTER — Other Ambulatory Visit: Payer: Self-pay | Admitting: Internal Medicine

## 2022-09-01 DIAGNOSIS — Z1231 Encounter for screening mammogram for malignant neoplasm of breast: Secondary | ICD-10-CM

## 2022-09-18 ENCOUNTER — Ambulatory Visit (INDEPENDENT_AMBULATORY_CARE_PROVIDER_SITE_OTHER): Payer: Medicare Other

## 2022-09-18 DIAGNOSIS — R55 Syncope and collapse: Secondary | ICD-10-CM | POA: Diagnosis not present

## 2022-09-22 LAB — CUP PACEART REMOTE DEVICE CHECK
Date Time Interrogation Session: 20240919231244
Implantable Pulse Generator Implant Date: 20210928

## 2022-10-02 NOTE — Progress Notes (Signed)
Carelink Summary Report / Loop Recorder 

## 2022-10-19 ENCOUNTER — Ambulatory Visit (INDEPENDENT_AMBULATORY_CARE_PROVIDER_SITE_OTHER): Payer: Medicare Other

## 2022-10-19 DIAGNOSIS — R55 Syncope and collapse: Secondary | ICD-10-CM | POA: Diagnosis not present

## 2022-10-19 LAB — CUP PACEART REMOTE DEVICE CHECK
Date Time Interrogation Session: 20241016230227
Implantable Pulse Generator Implant Date: 20210928

## 2022-11-07 NOTE — Progress Notes (Signed)
Carelink Summary Report / Loop Recorder 

## 2022-11-20 ENCOUNTER — Ambulatory Visit (INDEPENDENT_AMBULATORY_CARE_PROVIDER_SITE_OTHER): Payer: Medicare Other

## 2022-11-20 DIAGNOSIS — R55 Syncope and collapse: Secondary | ICD-10-CM

## 2022-11-20 LAB — CUP PACEART REMOTE DEVICE CHECK
Date Time Interrogation Session: 20241117230633
Implantable Pulse Generator Implant Date: 20210928

## 2022-12-13 NOTE — Progress Notes (Signed)
Carelink Summary Report / Loop Recorder 

## 2022-12-21 ENCOUNTER — Ambulatory Visit (INDEPENDENT_AMBULATORY_CARE_PROVIDER_SITE_OTHER): Payer: Medicare Other

## 2022-12-21 DIAGNOSIS — R55 Syncope and collapse: Secondary | ICD-10-CM

## 2022-12-21 LAB — CUP PACEART REMOTE DEVICE CHECK
Date Time Interrogation Session: 20241218230038
Implantable Pulse Generator Implant Date: 20210928

## 2023-01-22 ENCOUNTER — Ambulatory Visit (INDEPENDENT_AMBULATORY_CARE_PROVIDER_SITE_OTHER): Payer: Medicare Other

## 2023-01-22 DIAGNOSIS — R55 Syncope and collapse: Secondary | ICD-10-CM | POA: Diagnosis not present

## 2023-01-22 LAB — CUP PACEART REMOTE DEVICE CHECK
Date Time Interrogation Session: 20250119230316
Implantable Pulse Generator Implant Date: 20210928

## 2023-01-23 NOTE — Progress Notes (Signed)
Carelink Summary Report / Loop Recorder 

## 2023-02-15 ENCOUNTER — Ambulatory Visit: Admission: RE | Admit: 2023-02-15 | Payer: Medicare Other | Source: Ambulatory Visit

## 2023-02-17 NOTE — Progress Notes (Unsigned)
MRN : [id number]  Ann [name] is a 68 y.o. (1955-07-06) female who presents with chief complaint of check carotid arteries.  History of Present Illness:   The patient is seen for follow up evaluation of carotid stenosis. The carotid stenosis followed by ultrasound.    The patient denies amaurosis fugax. There is no recent history of TIA symptoms or focal motor deficits. There is no prior documented CVA.   The patient is taking enteric-coated aspirin 81 mg daily.   There is no history of migraine headaches. There is no history of seizures.   The patient has a history of coronary artery disease, no recent episodes of angina or shortness of breath. The patient denies PAD or claudication symptoms. There is a history of hyperlipidemia which is being treated with a statin.     Duplex ultrasound shows <40% RICA and occlusion (known) of the LICA.  No change compared to last study.  No outpatient medications have been marked as taking for the 02/19/23 encounter (Appointment) with Gilda Crease, Latina Craver, MD.    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   Multiple thyroid nodules    Myocardial infarction (HCC) 09/03/1998   Renal cyst, right    a.) CT on 05/20/2020 - upper pole RIGHT  kidney; measured 2.1 cm   S/P total thyroidectomy    Stroke (HCC) 2005   TIA   Vasovagal syncope     Past Surgical History:  Procedure Laterality Date   CAROTID STENT  07/18/2003   COLONOSCOPY     CORONARY ANGIOPLASTY WITH STENT PLACEMENT Left 09/03/1998   Procedure: CARDIAC CATHETERIZATION AND STENT PLACEMENT (NIR stent to RCA)   INSERTION OF MESH  10/08/2020   Procedure: INSERTION OF MESH;  Surgeon: Leafy Ro, MD;  Location: [facility] ORS;  Service: General;;   LOOP RECORDER INSERTION N/A 09/30/2019   LUMBAR FUSION N/A    L4-L5 SPINAL DECOMPRESSION AND FUSION; RODS PLACED   NECK SURGERY     THORACOTOMY/LOBECTOMY Right 11/04/2019   Procedure: THORACOTOMY/LOBECTOMY;  Surgeon: Hulda Marin, MD;  Location: [facility] ORS;  Service: Thoracic;  Laterality: Right;   THYROIDECTOMY N/A 02/25/2020   Procedure: THYROIDECTOMY, total;  Surgeon: Duanne Guess, MD;  Location: [facility] ORS;  Service: General;  Laterality: N/A;  Provider requesting 2.5 hours/150 minutes for procedure.   TONSILLECTOMY     TUBAL LIGATION     UMBILICAL HERNIA REPAIR N/A 10/08/2020   Procedure: HERNIA REPAIR UMBILICAL ADULT, open;  Surgeon: Leafy Ro, MD;  Location: [facility] ORS;  Service: General;  Laterality: N/A;    Social History Social History   Tobacco Use  Smoking status: Former    Current packs/day: 0.00    Average packs/day: 0.3 packs/day for 49.0 years (12.3 ttl pk-yrs)    Types: Cigarettes    Start date: 10/17/1970    Quit date: 10/17/2019    Years since quitting: 3.3   Smokeless tobacco: Never   Tobacco comments:    1 pack lasts 3-4 days  Vaping Use   Vaping status: Never Used  Substance Use Topics   Alcohol use: Yes    Comment: 1 shot of vodka with lemonade every evening   Drug use: Never    Family History Family History  Problem Relation Age of Onset   Alzheimer's disease Mother    Heart attack Father    Breast cancer Cousin    Diabetes Mellitus II Brother    Diabetes Mellitus II  Brother     Allergies  Allergen Reactions   Clopidogrel Anaphylaxis and Hives   Iodinated Contrast Media Swelling    Face, neck and throat     REVIEW OF SYSTEMS (Negative unless checked)  Constitutional: [] Weight loss  [] Fever  [] Chills Cardiac: [] Chest pain   [] Chest pressure   [] Palpitations   [] Shortness of breath when laying flat   [] Shortness of breath with exertion. Vascular:  [x] Pain in legs with walking   [] Pain in legs at rest  [] History of DVT   [] Phlebitis   [] Swelling in legs   [] Varicose veins   [] Non-healing ulcers Pulmonary:   [] Uses home oxygen   [] Productive cough   [] Hemoptysis   [] Wheeze  [] COPD   [] Asthma Neurologic:  [] Dizziness   [] Seizures   [] History of stroke   [] History of TIA  [] Aphasia   [] Vissual changes   [] Weakness or numbness in arm   [] Weakness or numbness in leg Musculoskeletal:   [] Joint swelling   [] Joint pain   [] Low back pain Hematologic:  [] Easy bruising  [] Easy bleeding   [] Hypercoagulable state   [] Anemic Gastrointestinal:  [] Diarrhea   [] Vomiting  [] Gastroesophageal reflux/heartburn   [] Difficulty swallowing. Genitourinary:  [] Chronic kidney disease   [] Difficult urination  [] Frequent urination   [] Blood in urine Skin:  [] Rashes   [] Ulcers  Psychological:  [] History of anxiety   []  History of major depression.  Physical Examination  There were no vitals filed for this visit. There is no height or weight on file to calculate BMI. Gen: WD/WN, NAD Head: [place]/AT, No temporalis wasting.  Ear/Nose/Throat: Hearing grossly intact, nares w/o erythema or drainage Eyes: PER, EOMI, sclera nonicteric.  Neck: Supple, no masses.  No bruit or JVD.  Pulmonary:  Good air movement, no audible wheezing, no use of accessory muscles.  Cardiac: RRR, normal S1, S2, no Murmurs. Vascular:  carotid bruit noted Vessel Right Left  Radial Palpable Palpable  Carotid  Palpable  Palpable  Subclav  Palpable Palpable  Gastrointestinal: soft, non-distended. No guarding/no  peritoneal signs.  Musculoskeletal: M/S 5/5 throughout.  No visible deformity.  Neurologic: CN 2-12 intact. Pain and light touch intact in extremities.  Symmetrical.  Speech is fluent. Motor exam as listed above. Psychiatric: Judgment intact, Mood & affect appropriate for pt's clinical situation. Dermatologic: No rashes or ulcers noted.  No changes consistent with cellulitis.   CBC Lab Results  Component Value Date   WBC 5.8 01/25/2021   HGB 13.8 01/25/2021   HCT 42.7 01/25/2021   MCV 92.0 01/25/2021   PLT 246 01/25/2021    BMET    Component Value Date/Time   NA 138 01/25/2021 1230   K 3.6  01/25/2021 1230   CL 104 01/25/2021 1230   CO2 27 01/25/2021 1230   GLUCOSE 143 (H) 01/25/2021 1230   BUN 10 01/25/2021 1230   CREATININE 0.71 01/25/2021 1230   CALCIUM 8.9 01/25/2021 1230   GFRNONAA >60 01/25/2021 1230   GFRAA >60 09/18/2019 1031   CrCl cannot be calculated (Patient's most recent lab result is older than the maximum 21 days allowed.).  COAG Lab Results  Component Value Date   INR 0.9 10/31/2019    Radiology CUP PACEART REMOTE DEVICE CHECK Result Date: 01/22/2023 ILR summary report received. Battery status OK. Normal device function. No new symptom, tachy, brady, or pause episodes. No new AF episodes. Monthly summary reports and ROV/PRN LA, CVRS    Assessment/Plan There are no diagnoses linked to this encounter.   Levora Dredge, MD  02/17/2023 3:53 PM

## 2023-02-19 ENCOUNTER — Encounter (INDEPENDENT_AMBULATORY_CARE_PROVIDER_SITE_OTHER): Payer: Self-pay | Admitting: Vascular Surgery

## 2023-02-19 ENCOUNTER — Ambulatory Visit (INDEPENDENT_AMBULATORY_CARE_PROVIDER_SITE_OTHER): Payer: Medicare Other | Admitting: Vascular Surgery

## 2023-02-19 ENCOUNTER — Ambulatory Visit (INDEPENDENT_AMBULATORY_CARE_PROVIDER_SITE_OTHER): Payer: Medicare Other

## 2023-02-19 VITALS — BP 133/87 | HR 60 | Resp 16 | Wt 134.6 lb

## 2023-02-19 DIAGNOSIS — I10 Essential (primary) hypertension: Secondary | ICD-10-CM

## 2023-02-19 DIAGNOSIS — E118 Type 2 diabetes mellitus with unspecified complications: Secondary | ICD-10-CM | POA: Diagnosis not present

## 2023-02-19 DIAGNOSIS — I2510 Atherosclerotic heart disease of native coronary artery without angina pectoris: Secondary | ICD-10-CM

## 2023-02-19 DIAGNOSIS — I6523 Occlusion and stenosis of bilateral carotid arteries: Secondary | ICD-10-CM

## 2023-02-21 ENCOUNTER — Inpatient Hospital Stay: Payer: Medicare Other | Attending: Oncology | Admitting: Oncology

## 2023-02-22 ENCOUNTER — Ambulatory Visit (INDEPENDENT_AMBULATORY_CARE_PROVIDER_SITE_OTHER): Payer: Medicare Other

## 2023-02-22 DIAGNOSIS — R55 Syncope and collapse: Secondary | ICD-10-CM

## 2023-02-22 LAB — CUP PACEART REMOTE DEVICE CHECK
Date Time Interrogation Session: 20250219230146
Implantable Pulse Generator Implant Date: 20210928

## 2023-02-28 NOTE — Progress Notes (Signed)
 Carelink Summary Report / Loop Recorder

## 2023-03-08 ENCOUNTER — Other Ambulatory Visit: Payer: Self-pay | Admitting: Internal Medicine

## 2023-03-08 DIAGNOSIS — Z1231 Encounter for screening mammogram for malignant neoplasm of breast: Secondary | ICD-10-CM

## 2023-03-26 ENCOUNTER — Ambulatory Visit: Payer: Medicare Other

## 2023-03-26 DIAGNOSIS — R55 Syncope and collapse: Secondary | ICD-10-CM | POA: Diagnosis not present

## 2023-03-26 LAB — CUP PACEART REMOTE DEVICE CHECK
Date Time Interrogation Session: 20250323230312
Implantable Pulse Generator Implant Date: 20210928

## 2023-03-27 NOTE — Progress Notes (Signed)
 Carelink Summary Report / Loop Recorder

## 2023-04-26 ENCOUNTER — Ambulatory Visit (INDEPENDENT_AMBULATORY_CARE_PROVIDER_SITE_OTHER): Payer: Medicare Other

## 2023-04-26 DIAGNOSIS — R55 Syncope and collapse: Secondary | ICD-10-CM | POA: Diagnosis not present

## 2023-04-26 LAB — CUP PACEART REMOTE DEVICE CHECK
Date Time Interrogation Session: 20250423230324
Implantable Pulse Generator Implant Date: 20210928

## 2023-05-10 NOTE — Progress Notes (Signed)
 Carelink Summary Report / Loop Recorder

## 2023-05-11 ENCOUNTER — Other Ambulatory Visit: Payer: Self-pay | Admitting: Oncology

## 2023-05-11 DIAGNOSIS — Z85118 Personal history of other malignant neoplasm of bronchus and lung: Secondary | ICD-10-CM

## 2023-05-11 DIAGNOSIS — J8410 Pulmonary fibrosis, unspecified: Secondary | ICD-10-CM

## 2023-05-14 ENCOUNTER — Ambulatory Visit: Admission: RE | Admit: 2023-05-14 | Source: Ambulatory Visit

## 2023-05-28 ENCOUNTER — Ambulatory Visit (INDEPENDENT_AMBULATORY_CARE_PROVIDER_SITE_OTHER)

## 2023-05-28 DIAGNOSIS — R55 Syncope and collapse: Secondary | ICD-10-CM

## 2023-05-31 LAB — CUP PACEART REMOTE DEVICE CHECK
Date Time Interrogation Session: 20250524230131
Implantable Pulse Generator Implant Date: 20210928

## 2023-06-03 ENCOUNTER — Ambulatory Visit: Payer: Self-pay | Admitting: Cardiology

## 2023-06-05 NOTE — Progress Notes (Signed)
 Carelink Summary Report / Loop Recorder

## 2023-06-08 ENCOUNTER — Telehealth: Payer: Self-pay | Admitting: Oncology

## 2023-06-08 ENCOUNTER — Inpatient Hospital Stay: Admitting: Oncology

## 2023-06-08 NOTE — Telephone Encounter (Signed)
 Pt called back and I transferred the phone call to centralized scheduling so she can schedule her ct and told her once that was scheduled then we cuold schedule the MD appt

## 2023-06-08 NOTE — Telephone Encounter (Signed)
 Patient has an appointment at 1pm today with Dr. Randy Buttery. Because she did not go to her CT scan on 5/12 there is no need for the appointment today. I called to let her know but she did not answer, so I left a voicemail. I asked her to call me back so we could get the ct and follow up appointment rescheduled.

## 2023-06-08 NOTE — Telephone Encounter (Signed)
 CT is scheduled and pt called back. I let her know when the MD is scheduled and she was good with the date/time. AVS mailed out per pt request.

## 2023-06-15 ENCOUNTER — Ambulatory Visit
Admission: RE | Admit: 2023-06-15 | Discharge: 2023-06-15 | Disposition: A | Discharge status: Home / Self Care | Source: Ambulatory Visit | Attending: Oncology | Admitting: Oncology

## 2023-06-15 DIAGNOSIS — Z85118 Personal history of other malignant neoplasm of bronchus and lung: Secondary | ICD-10-CM | POA: Insufficient documentation

## 2023-06-28 ENCOUNTER — Ambulatory Visit (INDEPENDENT_AMBULATORY_CARE_PROVIDER_SITE_OTHER): Payer: Medicare Other

## 2023-06-28 DIAGNOSIS — R55 Syncope and collapse: Secondary | ICD-10-CM | POA: Diagnosis not present

## 2023-06-28 LAB — CUP PACEART REMOTE DEVICE CHECK
Date Time Interrogation Session: 20250625230913
Implantable Pulse Generator Implant Date: 20210928

## 2023-07-01 ENCOUNTER — Ambulatory Visit: Payer: Self-pay | Admitting: Cardiology

## 2023-07-03 ENCOUNTER — Inpatient Hospital Stay: Attending: Oncology | Admitting: Oncology

## 2023-07-03 ENCOUNTER — Encounter: Payer: Self-pay | Admitting: Oncology

## 2023-07-03 VITALS — BP 137/93 | HR 62 | Temp 97.2°F | Resp 16 | Wt 131.0 lb

## 2023-07-03 DIAGNOSIS — Z902 Acquired absence of lung [part of]: Secondary | ICD-10-CM | POA: Diagnosis not present

## 2023-07-03 DIAGNOSIS — Z87891 Personal history of nicotine dependence: Secondary | ICD-10-CM | POA: Insufficient documentation

## 2023-07-03 DIAGNOSIS — Z08 Encounter for follow-up examination after completed treatment for malignant neoplasm: Secondary | ICD-10-CM

## 2023-07-03 DIAGNOSIS — Z85118 Personal history of other malignant neoplasm of bronchus and lung: Secondary | ICD-10-CM | POA: Insufficient documentation

## 2023-07-03 NOTE — Progress Notes (Signed)
 Hematology/Oncology Consult note [facility]  Telephone:([phone] [phone]  Patient Care Team: Lenon Layman ORN, MD as PCP - General (Internal Medicine) Verdene Gills, RN as Oncology Nurse Navigator Melanee Annah BROCKS, MD as Consulting Physician (Oncology) Volney Lye, MD (Inactive) as Referring Physician (Cardiothoracic Surgery)   Name of the patient: Yeny Schmoll  [id number]  10-21-1955   Date of visit: 07/03/23  Diagnosis-history of stage I lung cancer s/p surgical resection  Chief complaint/ Reason for visit-routine surveillance visit for lung cancer  Heme/Onc history: Patient is a 68 year old female with a past medical history significant for tobacco dependence, peripheral vascular disease and coronary artery disease.  She underwent CT lung cancer screening protocol in September 2021 which showed a suspicious right upper lobe lung nodule 10.3 mm in size.  This was followed by a PET scan which showed hypermetabolic spiculated right upper lobe lung nodule with an SUV of 7 T1 8 with no evidence of locoregional adenopathy or distant metastatic disease.  Incidental finding of an enlarged right lobe of the thyroid gland.  Intense metabolic activity at the level of distal rectum/anus favored physiologic   Patient was evaluated by Dr. 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  No evidence of visceral pleural invasion.  There was another tumor also in the upper lobe of the lung 0.8 cm which was adenocarcinoma in situ nonmucinous.  Margins negative lymph node sampling of 7 nodes did not show any evidence of malignancy pT1b pN0   Patient had pleural effusion post surgery which was tapped and was negative for malignancy.  Patient remains in remission as far as the lung cancer is concerned  Interval history-she is doing well.  Denies  any cough or shortness of breath.  Denies any changes in her appetite or weight.  No recent hospitalizations.  ECOG PS- 1 Pain scale- 0   Review of systems- Review of Systems  Constitutional:  Negative for chills, fever, malaise/fatigue and weight loss.  HENT:  Negative for congestion, ear discharge and nosebleeds.   Eyes:  Negative for blurred vision.  Respiratory:  Negative for cough, hemoptysis, sputum production, shortness of breath and wheezing.   Cardiovascular:  Negative for chest pain, palpitations, orthopnea and claudication.  Gastrointestinal:  Negative for abdominal pain, blood in stool, constipation, diarrhea, heartburn, melena, nausea and vomiting.  Genitourinary:  Negative for dysuria, flank pain, frequency, hematuria and urgency.  Musculoskeletal:  Negative for back pain, joint pain and myalgias.  Skin:  Negative for rash.  Neurological:  Negative for dizziness, tingling, focal weakness, seizures, weakness and headaches.  Endo/Heme/Allergies:  Does not bruise/bleed easily.  Psychiatric/Behavioral:  Negative for depression and suicidal ideas. The patient does not have insomnia.       Allergies  Allergen Reactions   Clopidogrel Anaphylaxis and Hives   Iodinated Contrast Media Swelling    Face, neck and throat   Metformin     Other Reaction(s): Other (See Comments)  Eye irritation     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(HCC)    a.) mucinous adenocarcinoma (pathological stage Ia p T1a pN0 cM0); b.) s/p RUL lobectomy 11/04/2019   Aortic atherosclerosis (HCC)    Carotid artery stenosis    COPD (chronic obstructive pulmonary disease) (HCC)    Coronary artery disease    NIR stent to RCA   Diabetes mellitus  without complication (HCC)    Diverticulosis    GERD (gastroesophageal reflux disease)    History of 2019 novel coronavirus disease (COVID-19) 06/17/2020   HLD (hyperlipidemia)    Hypertension    Hypokalemia    Implantable  loop recorder present 09/30/2019   Left upper lobe pulmonary nodule 05/20/2020   a.) CT on 05/20/2020 --> new tiny pulmonary nodule; measured 4 mm.   Multiple thyroid nodules    Myocardial infarction (HCC) 09/03/1998   Renal cyst, right    a.) CT on 05/20/2020 - upper pole RIGHT kidney; measured 2.1 cm   S/P total thyroidectomy    Stroke (HCC) 2005   TIA   Vasovagal syncope      Past Surgical History:  Procedure Laterality Date   CAROTID STENT  07/18/2003   COLONOSCOPY     CORONARY ANGIOPLASTY WITH STENT PLACEMENT Left 09/03/1998   Procedure: CARDIAC CATHETERIZATION AND STENT PLACEMENT (NIR stent to RCA)   INSERTION OF MESH  10/08/2020   Procedure: INSERTION OF MESH;  Surgeon: Jordis Laneta FALCON, MD;  Location: [facility];  Service: General;;   LOOP RECORDER INSERTION N/A 09/30/2019   LUMBAR FUSION N/A    L4-L5 SPINAL DECOMPRESSION AND FUSION; RODS PLACED   NECK SURGERY     THORACOTOMY/LOBECTOMY Right 11/04/2019   Procedure: THORACOTOMY/LOBECTOMY;  Surgeon: Volney Lye, MD;  Location: [facility];  Service: Thoracic;  Laterality: Right;   THYROIDECTOMY N/A 02/25/2020   Procedure: THYROIDECTOMY, total;  Surgeon: Marolyn Nest, MD;  Location: [facility];  Service: General;  Laterality: N/A;  Provider requesting 2.5 hours/150 minutes for procedure.   TONSILLECTOMY     TUBAL LIGATION     UMBILICAL HERNIA REPAIR N/A 10/08/2020   Procedure: HERNIA REPAIR UMBILICAL ADULT, open;  Surgeon: Jordis Laneta FALCON, MD;  Location: [facility];  Service: General;  Laterality: N/A;    Social History   Socioeconomic History   Marital status: Married    Spouse name: Not on file   Number of children: Not on file   Years of education: Not on file   Highest education level: Not on file  Occupational History   Not on file  Tobacco Use   Smoking status: Former    Current packs/day: 0.00    Average packs/day: 0.3 packs/day for 49.0 years (12.3 ttl pk-yrs)    Types: Cigarettes    Start date: 10/17/1970     Quit date: 10/17/2019    Years since quitting: 3.7   Smokeless tobacco: Never   Tobacco comments:    1 pack lasts 3-4 days  Vaping Use   Vaping status: Never Used  Substance and Sexual Activity   Alcohol use: Yes    Comment: 1 shot of vodka with lemonade every evening   Drug use: Never   Sexual activity: Not on file  Other Topics Concern   Not on file  Social History Narrative   Live at home with hubby   Social Drivers of Health   Financial Resource Strain: Low Risk  (03/07/2022)   [facility]   Overall Financial Resource Strain (CARDIA)    Difficulty of Paying Living Expenses: Not hard at all  Food Insecurity: No Food Insecurity (03/07/2022)   Received from [facility]   Hunger Vital Sign    Within the past 12 months, you worried that your food would run out before you got the money to buy more.: Never true    Within the past 12 months, the food you  bought just didn't last and you didn't have money to get more.: Never true  Transportation Needs: No Transportation Needs (03/07/2022)   Received from [facility] - Transportation    In the past 12 months, has lack of transportation kept you from medical appointments or from getting medications?: No    Lack of Transportation (Non-Medical): No  Physical Activity: Not on file  Stress: Not on file  Social Connections: Not on file  Intimate Partner Violence: Not on file    Family History  Problem Relation Age of Onset   Alzheimer's disease Mother    Heart attack Father    Breast cancer Cousin    Diabetes Mellitus II Brother    Diabetes Mellitus II Brother      Current Outpatient Medications:    albuterol  (VENTOLIN  HFA) 108 (90 Base) MCG/ACT inhaler, Inhale 1-2 puffs into the lungs every 6 (six) hours as needed for wheezing or shortness of breath., Disp: , Rfl:    aspirin  325 MG tablet, Take 325 mg by mouth in the morning., Disp: , Rfl:    fluticasone   (FLONASE ) 50 MCG/ACT nasal spray, Place into the nose., Disp: , Rfl:    furosemide  (LASIX ) 20 MG tablet, Take 20 mg by mouth in the morning., Disp: , Rfl:    JARDIANCE 10 MG TABS tablet, Take 10 mg by mouth daily., Disp: , Rfl:    levothyroxine  (SYNTHROID ) 88 MCG tablet, Take 1 tablet (88 mcg total) by mouth daily at 6 (six) AM., Disp: 30 tablet, Rfl: 3   metoprolol  tartrate (LOPRESSOR ) 25 MG tablet, Take 25 mg by mouth 2 (two) times daily., Disp: , Rfl:    pantoprazole  (PROTONIX ) 40 MG tablet, Take 40 mg by mouth daily., Disp: , Rfl:    rosuvastatin  (CRESTOR ) 20 MG tablet, Take 20 mg by mouth at bedtime., Disp: , Rfl:    spironolactone  (ALDACTONE ) 25 MG tablet, Take 25 mg by mouth daily., Disp: , Rfl:    celecoxib (CELEBREX) 100 MG capsule, Take 100 mg by mouth in the morning. (Patient not taking: Reported on 07/03/2023), Disp: , Rfl:    lidocaine  (LIDODERM ) 5 %, Place 1 patch onto the skin daily. Remove & Discard patch within 12 hours or as directed by MD (Patient not taking: Reported on 07/03/2023), Disp: 30 patch, Rfl: 0   phentermine (ADIPEX-P) 37.5 MG tablet, Take 37.5 mg by mouth daily before breakfast. (Patient not taking: Reported on 07/03/2023), Disp: , Rfl:    potassium chloride  SA (KLOR-CON ) 20 MEQ tablet, Take 20 mEq by mouth in the morning. (Patient not taking: Reported on 07/03/2023), Disp: , Rfl:   Physical exam:  Vitals:   07/03/23 1312  BP: (!) 137/93  Pulse: 62  Resp: 16  Temp: (!) 97.2 F (36.2 C)  TempSrc: Tympanic  SpO2: 100%  Weight: 131 lb (59.4 kg)   Physical Exam  Cardiovascular:     Rate and Rhythm: Normal rate and regular rhythm.     Heart sounds: Normal heart sounds.  Pulmonary:     Effort: Pulmonary effort is normal.     Breath sounds: Normal breath sounds.  Abdominal:     General: Bowel sounds are normal.   Skin:    General: Skin is warm and dry.   Neurological:     Mental Status: She is alert and oriented to person, place, and time.      I have  personally reviewed labs listed below:    Latest Ref Rng &  Units 01/25/2021   12:30 PM  CMP  Glucose 70 - 99 mg/dL 856   BUN 8 - 23 mg/dL 10   Creatinine 9.55 - 1.00 mg/dL 9.28   Sodium 864 - 854 mmol/L 138   Potassium 3.5 - 5.1 mmol/L 3.6   Chloride 98 - 111 mmol/L 104   CO2 22 - 32 mmol/L 27   Calcium  8.9 - 10.3 mg/dL 8.9   Total Protein 6.5 - 8.1 g/dL 7.5   Total Bilirubin 0.3 - 1.2 mg/dL 0.4   Alkaline Phos 38 - 126 U/L 152   AST 15 - 41 U/L 16   ALT 0 - 44 U/L 16       Latest Ref Rng & Units 01/25/2021   12:30 PM  CBC  WBC 4.0 - 10.5 K/uL 5.8   Hemoglobin 12.0 - 15.0 g/dL 86.1   Hematocrit 63.9 - 46.0 % 42.7   Platelets 150 - 400 K/uL 246    I have personally reviewed Radiology images listed below: No images are attached to the encounter.  CT CHEST WO CONTRAST Result Date: 06/29/2023 CLINICAL DATA:  Lung cancer.  * Tracking Code: BO * EXAM: CT CHEST WITHOUT CONTRAST TECHNIQUE: Multidetector CT imaging of the chest was performed following the standard protocol without IV contrast. RADIATION DOSE REDUCTION: This exam was performed according to the departmental dose-optimization program which includes automated exposure control, adjustment of the mA and/or kV according to patient size and/or use of iterative reconstruction technique. COMPARISON:  08/08/2022. FINDINGS: Cardiovascular: Atherosclerotic calcification of the aorta and coronary arteries. Heart is at the upper limits of normal in size. No pericardial effusion. Mediastinum/Nodes: No pathologically enlarged mediastinal or axillary lymph nodes. Hilar regions are difficult to definitively evaluate without IV contrast. Esophagus is grossly unremarkable. Lungs/Pleura: Right upper lobectomy. Additional postoperative scarring along the right major fissure. Centrilobular emphysema. 4 mm inferior lingular nodule (3/98), stable, with surrounding architectural distortion. No pleural fluid. Airway is otherwise unremarkable. Upper  Abdomen: Low-attenuation lesions in the right kidney. No specific follow-up necessary. Visualized portions of the liver, gallbladder, adrenal glands, kidneys, spleen, pancreas, stomach and bowel are otherwise grossly unremarkable. No upper abdominal adenopathy. Musculoskeletal: Degenerative changes in the spine. Chronic sclerosis in the left sixth lateral rib. IMPRESSION: 1. Stable 4 mm lingular nodule. Recommend continued attention on follow-up. 2. Aortic atherosclerosis (ICD10-I70.0). Coronary artery calcification. 3.  Emphysema (ICD10-J43.9). Electronically Signed   By: Newell Eke M.D.   On: 06/29/2023 10:59   CUP PACEART REMOTE DEVICE CHECK Result Date: 06/28/2023 ILR summary report received. Battery status OK. Normal device function. No new symptom, tachy, brady, or pause episodes. No new AF episodes. Monthly summary reports and ROV/PRN ML, CVRS    Assessment and plan- Patient is a 68 y.o. female with history of stage I lung cancer s/p right upper lobectomy in November 2021 currently in remission here for routine Follow-up  I have reviewed CT chest images and independently and discussed findings with the patient in detail.  CT scan shows a stable lingular nodule measuring 4 mm which has remained stable over the last 1 year.  No other new nodules.  She is now more than 3 years out of her diagnosis of lung cancer and surgical resection.  We will therefore continue with CT scans on a yearly basis.  CT chest in 1 year and I will see her thereafter   Visit Diagnosis 1. Encounter for follow-up surveillance of lung cancer      Dr. Annah Skene, MD, MPH  [facility] at [facility] [phone] 07/03/2023 1:34 PM

## 2023-07-13 NOTE — Progress Notes (Signed)
 Carelink Summary Report / Loop Recorder

## 2023-07-13 NOTE — Addendum Note (Signed)
 Addended by: TAWNI DRILLING D on: 07/13/2023 12:02 PM   Modules accepted: Orders

## 2023-07-30 ENCOUNTER — Ambulatory Visit: Payer: Medicare Other

## 2023-07-30 DIAGNOSIS — R55 Syncope and collapse: Secondary | ICD-10-CM

## 2023-07-30 LAB — CUP PACEART REMOTE DEVICE CHECK
Date Time Interrogation Session: 20250727230902
Implantable Pulse Generator Implant Date: 20210928

## 2023-08-05 ENCOUNTER — Ambulatory Visit: Payer: Self-pay | Admitting: Cardiology

## 2023-08-17 ENCOUNTER — Ambulatory Visit
Admission: RE | Admit: 2023-08-17 | Discharge: 2023-08-17 | Disposition: A | Discharge status: Home / Self Care | Source: Ambulatory Visit | Attending: Internal Medicine | Admitting: Internal Medicine

## 2023-08-17 DIAGNOSIS — Z1231 Encounter for screening mammogram for malignant neoplasm of breast: Secondary | ICD-10-CM | POA: Diagnosis present

## 2023-08-30 ENCOUNTER — Ambulatory Visit: Payer: Medicare Other

## 2023-08-30 DIAGNOSIS — R55 Syncope and collapse: Secondary | ICD-10-CM

## 2023-08-30 LAB — CUP PACEART REMOTE DEVICE CHECK
Date Time Interrogation Session: 20250827230959
Implantable Pulse Generator Implant Date: 20210928

## 2023-09-02 ENCOUNTER — Ambulatory Visit: Payer: Self-pay | Admitting: Cardiology

## 2023-09-12 NOTE — Progress Notes (Signed)
 Carelink Summary Report / Loop Recorder

## 2023-09-13 NOTE — Progress Notes (Signed)
 Remote Loop Recorder Transmission

## 2023-10-01 ENCOUNTER — Ambulatory Visit (INDEPENDENT_AMBULATORY_CARE_PROVIDER_SITE_OTHER): Payer: Medicare Other

## 2023-10-01 DIAGNOSIS — I639 Cerebral infarction, unspecified: Secondary | ICD-10-CM

## 2023-10-01 LAB — CUP PACEART REMOTE DEVICE CHECK
Date Time Interrogation Session: 20250928230543
Implantable Pulse Generator Implant Date: 20210928

## 2023-10-03 NOTE — Progress Notes (Signed)
 Remote Loop Recorder Transmission

## 2023-10-07 ENCOUNTER — Ambulatory Visit: Payer: Self-pay | Admitting: Cardiology

## 2023-11-01 ENCOUNTER — Ambulatory Visit: Payer: Medicare Other

## 2023-11-01 DIAGNOSIS — R55 Syncope and collapse: Secondary | ICD-10-CM

## 2023-11-01 LAB — CUP PACEART REMOTE DEVICE CHECK
Date Time Interrogation Session: 20251029230720
Implantable Pulse Generator Implant Date: 20210928

## 2023-11-04 ENCOUNTER — Ambulatory Visit: Payer: Self-pay | Admitting: Cardiology

## 2023-11-07 NOTE — Progress Notes (Signed)
 Remote Loop Recorder Transmission

## 2023-12-02 ENCOUNTER — Ambulatory Visit: Attending: Cardiology

## 2023-12-02 DIAGNOSIS — R55 Syncope and collapse: Secondary | ICD-10-CM

## 2023-12-03 LAB — CUP PACEART REMOTE DEVICE CHECK
Date Time Interrogation Session: 20251129231856
Implantable Pulse Generator Implant Date: 20210928

## 2023-12-06 NOTE — Progress Notes (Signed)
 Remote Loop Recorder Transmission

## 2023-12-21 ENCOUNTER — Ambulatory Visit: Payer: Self-pay | Admitting: Cardiology

## 2024-01-02 ENCOUNTER — Ambulatory Visit: Attending: Cardiology

## 2024-01-02 DIAGNOSIS — R55 Syncope and collapse: Secondary | ICD-10-CM

## 2024-01-02 LAB — CUP PACEART REMOTE DEVICE CHECK
Date Time Interrogation Session: 20251230230424
Implantable Pulse Generator Implant Date: 20210928

## 2024-01-06 ENCOUNTER — Ambulatory Visit: Payer: Self-pay | Admitting: Cardiology

## 2024-01-07 NOTE — Progress Notes (Signed)
 Remote Loop Recorder Transmission

## 2024-02-02 ENCOUNTER — Ambulatory Visit: Attending: Cardiology

## 2024-02-02 DIAGNOSIS — R55 Syncope and collapse: Secondary | ICD-10-CM

## 2024-02-04 LAB — CUP PACEART REMOTE DEVICE CHECK
Date Time Interrogation Session: 20260130230204
Implantable Pulse Generator Implant Date: 20210928

## 2024-02-08 NOTE — Progress Notes (Signed)
 Remote Loop Recorder Transmission

## 2024-02-15 ENCOUNTER — Other Ambulatory Visit

## 2024-02-18 ENCOUNTER — Ambulatory Visit (INDEPENDENT_AMBULATORY_CARE_PROVIDER_SITE_OTHER): Payer: Medicare Other | Admitting: Vascular Surgery

## 2024-02-18 ENCOUNTER — Encounter (INDEPENDENT_AMBULATORY_CARE_PROVIDER_SITE_OTHER): Payer: Medicare Other

## 2024-02-22 ENCOUNTER — Ambulatory Visit: Admission: RE | Admit: 2024-02-22 | Admitting: Obstetrics and Gynecology

## 2024-02-22 ENCOUNTER — Encounter: Admission: RE | Payer: Self-pay | Source: Home / Self Care

## 2024-02-22 SURGERY — SALPINGO-OOPHORECTOMY, BILATERAL, ROBOT-ASSISTED
Anesthesia: Choice | Site: Pelvis

## 2024-03-04 ENCOUNTER — Ambulatory Visit

## 2024-04-04 ENCOUNTER — Ambulatory Visit

## 2024-07-02 ENCOUNTER — Other Ambulatory Visit

## 2024-07-16 ENCOUNTER — Ambulatory Visit: Admitting: Oncology
